# Patient Record
Sex: Female | Born: 1950 | Race: White | Hispanic: No | Marital: Single | Smoking: Former smoker
Health system: Southern US, Community
[De-identification: ages and names within clinical notes are randomized; demographics above are authoritative.]

## PROBLEM LIST (undated history)

## (undated) ENCOUNTER — Encounter

## (undated) ENCOUNTER — Telehealth

## (undated) DIAGNOSIS — G4733 Obstructive sleep apnea (adult) (pediatric): Secondary | ICD-10-CM

## (undated) DIAGNOSIS — Z794 Long term (current) use of insulin: Secondary | ICD-10-CM

## (undated) DIAGNOSIS — M549 Dorsalgia, unspecified: Secondary | ICD-10-CM

## (undated) DIAGNOSIS — E1143 Type 2 diabetes mellitus with diabetic autonomic (poly)neuropathy: Secondary | ICD-10-CM

## (undated) HISTORY — PX: BREAST SURGERY: SHX581

---

## 2015-10-21 DIAGNOSIS — G894 Chronic pain syndrome: Secondary | ICD-10-CM

## 2015-10-21 DIAGNOSIS — M1990 Unspecified osteoarthritis, unspecified site: Secondary | ICD-10-CM

## 2015-10-21 DIAGNOSIS — E1149 Type 2 diabetes mellitus with other diabetic neurological complication: Principal | ICD-10-CM

## 2015-10-21 MED ORDER — TRAMADOL HCL 50 MG PO TABS
50 mg | Freq: Four times a day (QID) | ORAL | 0 refills | PRN
Start: 2015-10-21 — End: ?

## 2015-10-21 NOTE — Telephone Encounter
Tramadol request denied-needs appt for 3 mo refill-cntrl med

## 2015-10-21 NOTE — Telephone Encounter
Noted.

## 2015-10-25 ENCOUNTER — Ambulatory Visit: Attending: Family Medicine | Primary: Family Medicine

## 2015-10-25 DIAGNOSIS — Z1331 Encounter for screening for depression: Secondary | ICD-10-CM

## 2015-10-25 DIAGNOSIS — F339 Major depressive disorder, recurrent, unspecified: Secondary | ICD-10-CM

## 2015-10-25 DIAGNOSIS — E1149 Type 2 diabetes mellitus with other diabetic neurological complication: Secondary | ICD-10-CM

## 2015-10-25 DIAGNOSIS — E1169 Type 2 diabetes mellitus with other specified complication: Secondary | ICD-10-CM

## 2015-10-25 DIAGNOSIS — I10 Essential (primary) hypertension: Secondary | ICD-10-CM

## 2015-10-25 DIAGNOSIS — H6690 Otitis media, unspecified, unspecified ear: Secondary | ICD-10-CM

## 2015-10-25 DIAGNOSIS — M1990 Unspecified osteoarthritis, unspecified site: Secondary | ICD-10-CM

## 2015-10-25 DIAGNOSIS — E1165 Type 2 diabetes mellitus with hyperglycemia: Secondary | ICD-10-CM

## 2015-10-25 DIAGNOSIS — G894 Chronic pain syndrome: Secondary | ICD-10-CM

## 2015-10-25 DIAGNOSIS — Z794 Long term (current) use of insulin: Secondary | ICD-10-CM

## 2015-10-25 DIAGNOSIS — F411 Generalized anxiety disorder: Secondary | ICD-10-CM

## 2015-10-25 DIAGNOSIS — Z6841 Body Mass Index (BMI) 40.0 and over, adult: Principal | ICD-10-CM

## 2015-10-25 DIAGNOSIS — M797 Fibromyalgia: Secondary | ICD-10-CM

## 2015-10-25 MED ORDER — LEVOFLOXACIN 250 MG PO TABS
250 mg | Freq: Every day | ORAL | 0 refills | Status: CP
Start: 2015-10-25 — End: ?

## 2015-10-25 MED ORDER — TRAMADOL HCL 50 MG PO TABS
50 mg | Freq: Four times a day (QID) | ORAL | 0 refills | Status: CP | PRN
Start: 2015-10-25 — End: 2015-12-05

## 2015-10-25 MED ORDER — HYDROCODONE-ACETAMINOPHEN 5-325 MG PO TABS
1 | ORAL_TABLET | Freq: Two times a day (BID) | ORAL | 0 refills | Status: CP | PRN
Start: 2015-10-25 — End: 2016-04-18

## 2015-10-25 MED ORDER — INSULIN ASPART 100 UNIT/ML SC SOPN
21 [IU] | Freq: Three times a day (TID) | SUBCUTANEOUS | 3 refills | Status: CP
Start: 2015-10-25 — End: 2016-06-02

## 2015-10-25 NOTE — Progress Notes
CHIEF COMPLAINT:       Vicki Cannon presents for Medications Refill (3 mo f/u)     SUBJECTIVE:       Pt is an Secondary school teacher in nursing.     Hypertension   The problem is unchanged. The problem is controlled. Associated symptoms include malaise/fatigue. Pertinent negatives include no chest pain or palpitations. There are no associated agents to hypertension. There are no known risk factors for coronary artery disease.  The current treatment provides moderate improvement. There are no compliance problems.  Hypertensive end-organ damage includes CAD/MI, heart failure, PVD and renovascular disease. She admits that   She just got out of class and is stressed and a/c in car not working.   Diabetes  This is a recurrent problem. The problem has been gradually improving.  Recent HbA1C was reviewed. Associated symptoms include fatigue and polyuria.  Pertinent negatives include no nausea, vomiting,syncope, or weakness.  There are no compliance problems. Diabetic  end-organ damage includes CAD/MI, heart failure, PVD and renovascular disease. The treatment provided moderate relief. BS have been elevated. She is taking novolog and levemir.  She cannot tolerate glucophage.     Hx of fibromyalgia and PVD.  Pt is on routine ultram and takes norco prn . She needs refills today. She is on plavix.She admits that her pain is chronic and severe in her hands and legs. She is able to perform her ADLs and IADLs independently as long as her pain is controlled.     The pt c/o right ear pain with dizziness. She was tx w z-pack with minimal relief.         Patient Active Problem List   Diagnosis   ? Controlled substance agreement signed   ? Chronic pain syndrome   ? Anxiety disorder   ? Essential hypertension   ? Diabetes mellitus type 2, uncontrolled   ? Insomnia, unspecified   ? RLS (restless legs syndrome)   ? Fibromyalgia   ? Diabetic neuropathy   ? HLD (hyperlipidemia)   ? CAD (coronary artery disease)   ? Insomnia

## 2015-10-25 NOTE — Progress Notes
?   Osteoarthritis, unspecified osteoarthritis type, unspecified site   ? Depression   ? Rotator cuff syndrome of left shoulder   ? PAD (peripheral artery disease)   ? BMI 40.0-44.9, adult        Past Medical History:   Diagnosis Date   ? Anxiety    ? AP (angina pectoris)    ? Arthritis    ? Asthma    ? Back injury    ? CHF (congestive heart failure)    ? Depression    ? Diabetes mellitus    ? Fibromyalgia    ? Gallbladder disease    ? GERD (gastroesophageal reflux disease)    ? Heart attack    ? Hyperlipidemia    ? Hypertension    ? Insomnia    ? Loss of vision    ? Neuropathy    ? Wears glasses       Past Surgical History:   Procedure Laterality Date   ? BREAST SURGERY     ? CHOLECYSTECTOMY     ? COSMETIC SURGERY        Family History   Problem Relation Age of Onset   ? Diabetes Mother    ? High Blood Pressure Mother    ? Heart Attack Mother    ? Diabetes Father    ? Heart Attack Father    ? Cancer Brother    ? Breast Cancer Brother    ? Cataract Paternal Grandfather       Social History     Social History   ? Marital status: Single     Spouse name: N/A   ? Number of children: N/A   ? Years of education: N/A     Occupational History   ? Not on file.     Social History Main Topics   ? Smoking status: Former Smoker     Quit date: 04/06/1978   ? Smokeless tobacco: Not on file   ? Alcohol use 15.0 oz/week     3 Glasses of wine per week   ? Drug use: No   ? Sexual activity: No     Other Topics Concern   ? Not on file     Social History Narrative      Allergies   Allergen Reactions   ? Cortizone-5 [Hydrocortisone] Palpitations   ? Latex Other (See Comments)     Asthma     ? Cymbalta [Duloxetine Hcl] Other (See Comments)     tremors      Outpatient Medications Prior to Visit   Medication Sig Dispense Refill   ? ALPRAZolam (XANAX) 1 MG Tablet Take 1 tablet by mouth daily as needed for sleep. 30 tablet 2   ? atorvastatin (LIPITOR) 10 MG Tablet Take 40 mg by mouth nightly at bedtime.

## 2015-10-25 NOTE — Progress Notes
GEN: No apparent distress.     HEENT: PERRL, EOMI, OP without exudates and no erythema. Right TM with retraction and erythema    NECK: Ant. Cervical  lymphadenopathy, Normal trachea. Thyroid normal size, no masses, nontender.    HEART: RRR , no murmurs.     LUNGS: CTA b/l. No Rhonchi, Rhales , or Wheezing      MSK: No C/C/E,  Motor +4/5 in b/l UEs and  LEs. DTR +2/4 in UEs and LEs,   Sensory: +4/5 in b/l UEs and LEs, DP and Posterior tibialis pulses are intact +2/4 b/l . 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 B/l knee crepitus. Glenohumeral joint ROM with mild restriction of Abduction to 90 degrees.     NEURO: CN II to XII: grossly intact, No focal deficits.    GAIT: antalgic gait.     PSYCH:   Alert and oriented X3. No signs of dementia     SKIN:  no ulcerations.         ASSESSMENT:        ICD-10-CM ICD-9-CM    1. BMI 40.0-44.9, adult Z68.41 V85.41    2. Fibromyalgia M79.7 729.1    3. Recurrent major depressive disorder, remission status unspecified F33.9 296.30    4. Essential hypertension I10 401.9    5. Osteoarthritis, unspecified osteoarthritis type, unspecified site M19.90 715.90 traMADol (ULTRAM) 50 MG Tablet      HYDROcodone-acetaminophen (NORCO) 5-325 MG Tablet   6. Otitis media, unspecified chronicity, unspecified laterality, unspecified otitis media type H66.90 382.9 levoFLOXacin (LEVAQUIN) 250 MG Tablet   7. Other diabetic neurological complication associated with type 2 diabetes mellitus E11.49 250.60 traMADol (ULTRAM) 50 MG Tablet      HYDROcodone-acetaminophen (NORCO) 5-325 MG Tablet   8. Chronic pain syndrome G89.4 338.4 traMADol (ULTRAM) 50 MG Tablet      HYDROcodone-acetaminophen (NORCO) 5-325 MG Tablet   9. Generalized anxiety disorder F41.1 300.02 HYDROcodone-acetaminophen (NORCO) 5-325 MG Tablet   10. Uncontrolled type 2 diabetes mellitus with other specified complication, with long-term current use of insulin E11.69 250.82 insulin aspart (NovoLOG) pen    E11.65 V58.67     Z79.4

## 2015-10-25 NOTE — Progress Notes
11. Screening for depression Z13.89 V79.0       PLAN:      Orders Placed This Encounter   Medications   ? traMADol (ULTRAM) 50 MG Tablet     Sig: Take 1 tablet by mouth every 6 hours as needed for pain.     Dispense:  120 tablet     Refill:  0   ? HYDROcodone-acetaminophen (NORCO) 5-325 MG Tablet     Sig: Take 1 tablet by mouth 2 times daily as needed for pain (severe pain only).     Dispense:  30 tablet     Refill:  0   ? levoFLOXacin (LEVAQUIN) 250 MG Tablet     Sig: Take 1 tablet by mouth daily for 10 days.     Dispense:  10 tablet     Refill:  0   ? insulin aspart (NovoLOG) pen     Sig: Inject 21 Units into the skin 3 times daily (before meals).     Dispense:  10 pen     Refill:  3      No orders of the following type(s) were placed in this encounter: Procedures       Return in about 3 months (around 01/25/2016).      Health Maintenance   Topic Date Due   ? Urine Protein  10/19/51   ? Diabetic Foot Exam  02/20/1952   ? Diabetic Eye Exam  07/18/1951   ? Preventive Wellness Visit  06/14/1969   ? Pap Smear  06/14/1972   ? Mammogram Discussion  06/15/1991   ? Colonoscopy  06/14/2001   ? Hemoglobin A1C  05/15/2015   ? Pneumovax / Prevnar (1 of 1 - PPSV23) 07/16/2016 (Originally 06/15/1970)   ? Zoster Vaccine (1) 07/16/2016 (Originally 06/15/2011)   ? Lipid Profile  11/12/2015   ? Creatinine  11/12/2015   ? Basic Metabolic Panel  11/12/2015   ? USPSTF HIV Risk Assessment  Excluded   ? USPSTF Hepatitis C Screening  Excluded   ? Influenza Vaccine  Excluded   ? DTaP,Tdap,and Td Vaccines  Excluded      Levaquin  Increase Novolog. Continue levemir  F/u with lab work ordered  RF on pain Rx given for chronic pain control: ultram and norco  Levaquin    Explained the need for patient to check their blood pressure outside of clinic to verify their numbers.    They were instructed to do this once a week for a month and bring for me to evaluate at their follow up visit with me.  Repeated bp in the office was stable after 10 min

## 2015-10-25 NOTE — Progress Notes
Formal conference with patient regarding physical findings, diagnostic conclusions, theraputic risks-advantages-alternatives and recommendations.  Verbal informed consent was given by the patient.  Patient with multiple number of diagnosis and treatment options.  I have reviewed the  medications with the patient and counseled the patient on the potential risks and benefits.  Moderate amount and complexity of data and risks of complications.

## 2015-10-25 NOTE — Progress Notes
?   clopidogrel (PLAVIX) 75 MG tablet Take 75 mg by mouth daily.     ? Esomeprazole Magnesium (NEXIUM PO) Take 40 mg by mouth 4 times daily.      ? furosemide (LASIX) 40 MG tablet Take 1 Tablet by mouth daily.     ? gabapentin (NEURONTIN) 300 MG Capsule Take 300mg , two tablets, three times a day for a total of 1800mg  540 capsule 0   ? HYDROcodone-acetaminophen (NORCO) 5-325 MG Tablet Take 1 tablet by mouth 2 times daily as needed for pain (severe pain only). 30 tablet 0   ? insulin aspart (NovoLOG) pen Inject 20 Units into the skin 3 times daily (before meals). 10 pen 3   ? insulin detemir (LEVEMIR) pen Inject 100 Units into the skin 2 times daily. 100 mL 11   ? lisinopril (PRINIVIL,ZESTRIL) 2.5 MG tablet Take 2.5 mg by mouth daily.     ? metoprolol tartrate (LOPRESSOR) 12.5 MG Tablet tablet Take by mouth daily.     ? Multiple Vitamin (MULTIVITAMIN) Tablet Take 1 Tablet by mouth daily.     ? potassium chloride 40 MEQ/30ML Solution Take 40 mEq by mouth.     ? sertraline (ZOLOFT) 25 MG tablet Take 1 Tablet by mouth daily. 30 Tablet 1   ? temazepam (RESTORIL) 30 MG Capsule Take 1 capsule by mouth nightly at bedtime as needed for sleep. 30 capsule 2   ? traMADol (ULTRAM) 50 MG Tablet Take 1 tablet by mouth every 6 hours as needed for pain. 120 tablet 0     No facility-administered medications prior to visit.          ROS:   Constitutional:  Fatigue +  Weakness negative.  Fever negative.  Chills negative.  Weight change negative. Insomnia negative  Eyes:  Vision changes negative,  Eye redness negative,   Eye pain negative.   Double vision negative.  Blurred vision negative    HENT:      Hearing change negative.  Tinnitus negative.  Vertigo +  Otalgia + Infection negative.  Nasal congestion negative  .  Rhinorrhea negative.  Sinus infection or pain negative.  Dental problems negative.   Sorethroat negative.    Respiratory: Cough negative.  SOB negative.  Sputum negative.  Hemoptysis negative. Wheezing negative.

## 2015-10-25 NOTE — Progress Notes
CV:   Chest pain negative.  Palpitations negative.  Orthopnea negative.  DOE negative.   Edema negative.  Intermittent claudication negative  .  Varicose veins negative.  Clots negative.   Endocrine:   Heat intolerance negative.  Cold intolerance negative.  Polyuria negative.  Polydipsia negative.    Hair or skin changes negative.     Neurologic:   Headaches negative,  Fainting negative.  LOC negative.  Seizures negative.  Weakness negative.  Paralysis negative.  Numbness negative.  Paresthesias negative.  Tremors negative.  Spasms negative.     Musculoskeletal:  Joint pains +  Joins swellings negative.  Joint stiffness negative.  Myalgias negative.    Psychiatric:    Mood disorder negative.  Memory change negative.  Suicidal thoughts or attempts negative  .  Memory loss negative.        OBJECTIVE:      No visits with results within 1 Month(s) from this visit.  Latest known visit with results is:    Office Visit on 08/02/2015   Component Date Value   ? CREATININE 08/02/2015 139.4    ? PH 08/02/2015 6.99    ? OXIDANT 08/02/2015 NEGATIVE    ? AMPHETAMINES 08/02/2015 NEGATIVE    ? BARBITURATES 08/02/2015 NEGATIVE    ? BENZODIAZEPINES 08/02/2015 POSITIVE*   ? ALPHAHYDROXYALPRAZOLAM 08/02/2015 272*   ? Alphahydroxymidazolam 08/02/2015 NEGATIVE    ? ALPHAHYDROXYTRIAZOLAM 08/02/2015 NEGATIVE    ? AMINOCLONAZEPAM 08/02/2015 NEGATIVE    ? Hydroxyethylflurazepam 08/02/2015 NEGATIVE    ? LORAZEPAM 08/02/2015 NEGATIVE    ? NORDIAZEPAM 08/02/2015 NEGATIVE    ? OXAZEPAM 08/02/2015 NEGATIVE    ? TEMAZEPAM 08/02/2015 NEGATIVE    ? MARIJUANA METABOLITE 08/02/2015 NEGATIVE    ? COCAINE METABOLITE 08/02/2015 NEGATIVE    ? METHADONE 08/02/2015 NEGATIVE    ? OPIATES 08/02/2015 NEGATIVE    ? OXYCODONE 08/02/2015 NEGATIVE    ? PHENCYCLIDINE 08/02/2015 NEGATIVE    ? COMMENT 08/02/2015           BP 136/81 - Pulse 83 - Temp 36.4 ?C (97.6 ?F) - Resp 16 - Ht 1.626 m (5\' 4" ) - Wt 115.4 kg (254 lb 6.4 oz) - BMI 43.67 kg/m2      Physical Exam

## 2015-12-04 DIAGNOSIS — G4701 Insomnia due to medical condition: Principal | ICD-10-CM

## 2015-12-04 DIAGNOSIS — M1990 Unspecified osteoarthritis, unspecified site: Secondary | ICD-10-CM

## 2015-12-04 DIAGNOSIS — G894 Chronic pain syndrome: Secondary | ICD-10-CM

## 2015-12-04 DIAGNOSIS — E1149 Type 2 diabetes mellitus with other diabetic neurological complication: Secondary | ICD-10-CM

## 2015-12-04 MED ORDER — TRAMADOL HCL 50 MG PO TABS
50 mg | Freq: Four times a day (QID) | ORAL | 0 refills | Status: CP | PRN
Start: 2015-12-04 — End: 2016-01-14

## 2015-12-04 MED ORDER — TEMAZEPAM 30 MG PO CAPS
30 mg | Freq: Every evening | ORAL | 2 refills | Status: CP | PRN
Start: 2015-12-04 — End: 2016-04-18

## 2015-12-04 NOTE — Telephone Encounter
Pt advised

## 2015-12-04 NOTE — Telephone Encounter
Scripts printed-call for pick up

## 2015-12-04 NOTE — Telephone Encounter
PCP Alanson AlySharma  [employer]    Pt would like a call when printed prescriptions can be picked up.  Thank you.

## 2016-01-14 DIAGNOSIS — G894 Chronic pain syndrome: Secondary | ICD-10-CM

## 2016-01-14 DIAGNOSIS — M1990 Unspecified osteoarthritis, unspecified site: Secondary | ICD-10-CM

## 2016-01-14 DIAGNOSIS — E1149 Type 2 diabetes mellitus with other diabetic neurological complication: Principal | ICD-10-CM

## 2016-01-14 MED ORDER — TRAMADOL HCL 50 MG PO TABS
50 mg | Freq: Four times a day (QID) | ORAL | 0 refills | Status: CP | PRN
Start: 2016-01-14 — End: 2016-02-27

## 2016-01-14 NOTE — Telephone Encounter
script printed-call for pick up

## 2016-01-14 NOTE — Telephone Encounter
Called and advised the patient the medication requested is ready for pick up and the patient verbally acknowledged. AH 01/14/2016

## 2016-01-14 NOTE — Telephone Encounter
Pcp sharma    Pt is requesting medication refill.    Thank you.    [id number] [address], [place] - [phone]?[Patient Preferred]??[phone]

## 2016-02-26 DIAGNOSIS — E1149 Type 2 diabetes mellitus with other diabetic neurological complication: Principal | ICD-10-CM

## 2016-02-26 DIAGNOSIS — G894 Chronic pain syndrome: Secondary | ICD-10-CM

## 2016-02-26 DIAGNOSIS — M1990 Unspecified osteoarthritis, unspecified site: Secondary | ICD-10-CM

## 2016-02-26 MED ORDER — TRAMADOL HCL 50 MG PO TABS
50 mg | Freq: Four times a day (QID) | ORAL | 0 refills | Status: CP | PRN
Start: 2016-02-26 — End: 2016-04-18

## 2016-03-02 NOTE — Telephone Encounter
Vm informed

## 2016-04-15 DIAGNOSIS — E1149 Type 2 diabetes mellitus with other diabetic neurological complication: Secondary | ICD-10-CM

## 2016-04-15 DIAGNOSIS — M1990 Unspecified osteoarthritis, unspecified site: Secondary | ICD-10-CM

## 2016-04-15 DIAGNOSIS — G894 Chronic pain syndrome: Secondary | ICD-10-CM

## 2016-04-15 DIAGNOSIS — G4701 Insomnia due to medical condition: Principal | ICD-10-CM

## 2016-04-15 MED ORDER — TRAMADOL HCL 50 MG PO TABS
50 mg | Freq: Four times a day (QID) | ORAL | 0 refills | PRN
Start: 2016-04-15 — End: ?

## 2016-04-15 MED ORDER — TEMAZEPAM 30 MG PO CAPS
30 mg | Freq: Every evening | ORAL | 2 refills | PRN
Start: 2016-04-15 — End: ?

## 2016-04-17 ENCOUNTER — Ambulatory Visit: Attending: Family Medicine | Primary: Family Medicine

## 2016-04-17 DIAGNOSIS — K219 Gastro-esophageal reflux disease without esophagitis: Secondary | ICD-10-CM

## 2016-04-17 DIAGNOSIS — M1990 Unspecified osteoarthritis, unspecified site: Secondary | ICD-10-CM

## 2016-04-17 DIAGNOSIS — E785 Hyperlipidemia, unspecified: Secondary | ICD-10-CM

## 2016-04-17 DIAGNOSIS — E1149 Type 2 diabetes mellitus with other diabetic neurological complication: Secondary | ICD-10-CM

## 2016-04-17 DIAGNOSIS — H547 Unspecified visual loss: Secondary | ICD-10-CM

## 2016-04-17 DIAGNOSIS — K829 Disease of gallbladder, unspecified: Secondary | ICD-10-CM

## 2016-04-17 DIAGNOSIS — I209 Angina pectoris, unspecified: Secondary | ICD-10-CM

## 2016-04-17 DIAGNOSIS — M797 Fibromyalgia: Secondary | ICD-10-CM

## 2016-04-17 DIAGNOSIS — E1165 Type 2 diabetes mellitus with hyperglycemia: Secondary | ICD-10-CM

## 2016-04-17 DIAGNOSIS — E1140 Type 2 diabetes mellitus with diabetic neuropathy, unspecified: Secondary | ICD-10-CM

## 2016-04-17 DIAGNOSIS — I509 Heart failure, unspecified: Secondary | ICD-10-CM

## 2016-04-17 DIAGNOSIS — E118 Type 2 diabetes mellitus with unspecified complications: Principal | ICD-10-CM

## 2016-04-17 DIAGNOSIS — Z794 Long term (current) use of insulin: Secondary | ICD-10-CM

## 2016-04-17 DIAGNOSIS — Z6841 Body Mass Index (BMI) 40.0 and over, adult: Secondary | ICD-10-CM

## 2016-04-17 DIAGNOSIS — G894 Chronic pain syndrome: Secondary | ICD-10-CM

## 2016-04-17 DIAGNOSIS — J45909 Unspecified asthma, uncomplicated: Secondary | ICD-10-CM

## 2016-04-17 DIAGNOSIS — G4700 Insomnia, unspecified: Secondary | ICD-10-CM

## 2016-04-17 DIAGNOSIS — F419 Anxiety disorder, unspecified: Secondary | ICD-10-CM

## 2016-04-17 DIAGNOSIS — Z973 Presence of spectacles and contact lenses: Secondary | ICD-10-CM

## 2016-04-17 DIAGNOSIS — S3992XA Unspecified injury of lower back, initial encounter: Secondary | ICD-10-CM

## 2016-04-17 DIAGNOSIS — E1169 Type 2 diabetes mellitus with other specified complication: Secondary | ICD-10-CM

## 2016-04-17 DIAGNOSIS — I10 Essential (primary) hypertension: Secondary | ICD-10-CM

## 2016-04-17 DIAGNOSIS — G4701 Insomnia due to medical condition: Secondary | ICD-10-CM

## 2016-04-17 DIAGNOSIS — G629 Polyneuropathy, unspecified: Secondary | ICD-10-CM

## 2016-04-17 DIAGNOSIS — F411 Generalized anxiety disorder: Secondary | ICD-10-CM

## 2016-04-17 DIAGNOSIS — F329 Major depressive disorder, single episode, unspecified: Secondary | ICD-10-CM

## 2016-04-17 DIAGNOSIS — I219 Acute myocardial infarction, unspecified: Principal | ICD-10-CM

## 2016-04-17 DIAGNOSIS — E119 Type 2 diabetes mellitus without complications: Secondary | ICD-10-CM

## 2016-04-17 MED ORDER — METOPROLOL TARTRATE 25 MG PO TABS
25 mg | Freq: Two times a day (BID) | ORAL | 3 refills
Start: 2016-04-17 — End: ?

## 2016-04-17 MED ORDER — INSULIN DETEMIR 100 UNIT/ML SC SOPN
11 refills
Start: 2016-04-17 — End: ?

## 2016-04-17 MED ORDER — HYDROCODONE-ACETAMINOPHEN 5-325 MG PO TABS
1 | ORAL_TABLET | Freq: Two times a day (BID) | ORAL | 0 refills | Status: CP | PRN
Start: 2016-04-17 — End: ?

## 2016-04-17 MED ORDER — TRAMADOL HCL 50 MG PO TABS
50 mg | Freq: Four times a day (QID) | ORAL | 0 refills | Status: CP | PRN
Start: 2016-04-17 — End: 2016-06-02

## 2016-04-17 MED ORDER — TEMAZEPAM 30 MG PO CAPS
30 mg | Freq: Every evening | ORAL | 2 refills | Status: CP | PRN
Start: 2016-04-17 — End: ?

## 2016-04-17 MED ORDER — GLUCOSE 4 G PO CHEW JX
16 g | Freq: Once | ORAL | Status: CP
Start: 2016-04-17 — End: ?

## 2016-04-17 NOTE — Progress Notes
?   Preventive Wellness Visit  06/14/1969   ? Pap Smear  06/14/1972   ? Breast Cancer Screening  06/14/2001   ? Colon Cancer Screening  06/14/2001   ? Hemoglobin A1C  05/15/2015   ? Lipid Profile  11/12/2015   ? Creatinine  11/12/2015   ? Basic Metabolic Panel  11/12/2015   ? Pneumovax / Prevnar (1 of 1 - PPSV23) 07/16/2016 (Originally 06/15/1970)   ? Zoster Vaccine (1) 07/16/2016 (Originally 06/15/2011)   ? USPSTF HIV Risk Assessment  Excluded   ? USPSTF Hepatitis C Screening  Excluded   ? Influenza Vaccine  Excluded   ? DTaP,Tdap,and Td Vaccines  Excluded      Labs done in office: BS 68 and hba1c is 13.2  Increase levemir to 105 in am and 102 in pm  Continue novolog  F/u w cardilogy  Explained the need for patient to check their blood pressure outside of clinic to verify their numbers.    They were instructed to do this three times per week for a month and bring the log  for me to evaluate at their follow up visit with me.  RF on pain Rx and restoril  UDS  Formal conference with patient regarding physical findings, diagnostic conclusions, theraputic risks-advantages-alternatives and recommendations.  Verbal informed consent was given by the patient.  Patient with multiple number of diagnosis and treatment options.  I have reviewed the  medications with the patient and counseled the patient on the potential risks and benefits.  Moderate amount and complexity of data and risks of complications.  Glucose tablet given today  Education given to the patient regarding the need to hold novolog when she misses a meal and reviewed signs of hypoglycemia

## 2016-04-17 NOTE — Progress Notes
5. Fibromyalgia M79.7 729.1    6. Uncontrolled type 2 diabetes mellitus with other specified complication, with long-term current use of insulin E11.69 250.82 insulin detemir (LEVEMIR) pen    E11.65 V58.67     Z79.4     7. Chronic pain syndrome G89.4 338.4 traMADol (ULTRAM) 50 MG PO Tablet      HYDROcodone-acetaminophen (NORCO) 5-325 MG PO Tablet   8. Osteoarthritis, unspecified osteoarthritis type, unspecified site M19.90 715.90 traMADol (ULTRAM) 50 MG PO Tablet      HYDROcodone-acetaminophen (NORCO) 5-325 MG PO Tablet   9. 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 Generalized anxiety disorder F41.1 300.02 HYDROcodone-acetaminophen (NORCO) 5-325 MG PO Tablet      PLAN:      Orders Placed This Encounter   Medications   ? glucose chewable tablet 16 g   ? metoprolol tartrate (LOPRESSOR) 25 MG PO Tablet     Sig: Take 1 tablet by mouth 2 times daily.     Dispense:  60 tablet     Refill:  3   ? insulin detemir (LEVEMIR) pen     Sig: Use 105 units in am and 102 u in pm     Dispense:  100 mL     Refill:  11     Dispense needles to administer levemir and novolog upto 5 times per day   ? traMADol (ULTRAM) 50 MG PO Tablet     Sig: Take 1 tablet by mouth every 6 hours as needed for pain.     Dispense:  120 tablet     Refill:  0   ? temazepam (RESTORIL) 30 MG PO Capsule     Sig: Take 1 capsule by mouth nightly at bedtime as needed for sleep.     Dispense:  30 capsule     Refill:  2   ? HYDROcodone-acetaminophen (NORCO) 5-325 MG PO Tablet     Sig: Take 1 tablet by mouth 2 times daily as needed for pain (severe pain only).     Dispense:  60 tablet     Refill:  0      Orders Placed This Encounter   Procedures   ? POCT glucose-Inhouse   ? POCT glycosylated hemoglobin (Hb A1C)        Return in about 3 months (around 07/16/2016).      Health Maintenance   Topic Date Due   ? Urine Microalbumin  06-14-51   ? Diabetic Foot Exam  06-14-51   ? Diabetic Eye Exam  06-14-51

## 2016-04-17 NOTE — Progress Notes
Change in appetite negative.  Nausea negative.  Vomiting negative.  Change in BMs negative.  Blood per rectum negative. Melena negative  Neurologic:   Headaches negative,  Fainting negative.  LOC negative.  Seizures negative.  Weakness negative.  Paralysis negative.  Numbness+.  Paresthesias negative.  Tremors negative.  Spasms negative.     Musculoskeletal:  Joint pains +.  Joins swellings negative.  Joint stiffness negative.  Myalgias negative.          OBJECTIVE:      Office Visit on 04/17/2016   Component Date Value   ? Glucose Random 04/17/2016 68*   ? Hemoglobin A1C, POC 04/17/2016 13.2          BP (!) 163/91 - Pulse 86 - Temp 36.9 ?C (98.4 ?F) - Resp 16 - Ht 1.626 m (5\' 4" ) - Wt 114.7 kg (252 lb 12.8 oz) - BMI 43.39 kg/m2      Physical Exam     GEN: No apparent distress.         NECK: No lymphadenopathy, Normal trachea. Thyroid normal size, no masses, nontender.    HEART: RRR , no murmurs.     LUNGS: CTA b/l. No Rhonchi, Rhales , or Wheezing        MSK: No C/C/E,  Motor +4/5 in b/l UEs and  LEs. DTR +2/4 in UEs and LEs,   Sensory: +3/5 in b/l UEs and LEs, DP and Posterior tibialis pulses are intact +2/4 b/l . B/l hands w swan neck deformities of the DIP joints.     NEURO: CN II to XII: grossly intact, No focal deficits.    PSYCH:   Alert and oriented X3. No signs of dementia           ASSESSMENT:        ICD-10-CM ICD-9-CM    1. Uncontrolled type 2 diabetes mellitus with complication, with long-term current use of insulin E11.8 250.82 POCT glucose-Inhouse    E11.65 V58.67 POCT glycosylated hemoglobin (Hb A1C)    Z79.4  glucose chewable tablet 16 g   2. BMI 40.0-44.9, adult Z68.41 V85.41    3. Essential hypertension with goal blood pressure less than 130/80 I10 401.9 metoprolol tartrate (LOPRESSOR) 25 MG PO Tablet   4. Other diabetic neurological complication associated with type 2 diabetes mellitus E11.49 250.60 traMADol (ULTRAM) 50 MG PO Tablet      HYDROcodone-acetaminophen (NORCO) 5-325 MG PO Tablet

## 2016-04-17 NOTE — Progress Notes
CHIEF COMPLAINT:       Vicki Cannon presents for Medications Refill     SUBJECTIVE:         Hypertension   The problem is unchanged. The problem is controlled. Associated symptoms include malaise/fatigue. Pertinent negatives include no chest pain or palpitations. There are no associated agents to hypertension. There are no known risk factors for coronary artery disease.  The current treatment provides moderate improvement. There are no compliance problems.  Hypertensive end-organ damage includes CAD/MI, heart failure, PVD and renovascular disease. Cardiology recently increased metoprolol to 25mg  bid      Diabetes  This is a recurrent problem. The problem has been gradually worsening.  Recent HbA1C was reviewed. Associated symptoms include fatigue and polyuria.  Pertinent negatives include no nausea, vomiting,syncope, or weakness.  There are compliance problems with lab testing. . Diabetic  end-organ damage includes CAD/MI, heart failure, PVD and renovascular disease. The treatment with levemir and novolog provides moderate relief. Pt admits needing a glucose tablet b/c she missed a meal and feels shaky        OA:  Requesting refills on pain medications. Not sleepy. Pain Rx allows her to do her ADL's and IADL's independently. Pt w diabetic neuropathy w severe pain in her feet and fibromyalgia    She has chronic insomnia and requesting restoril rf.        Patient Active Problem List   Diagnosis   ? Controlled substance agreement signed   ? Chronic pain syndrome   ? Anxiety disorder   ? Essential hypertension   ? Diabetes mellitus type 2, uncontrolled   ? Insomnia, unspecified   ? RLS (restless legs syndrome)   ? Fibromyalgia   ? Diabetic neuropathy   ? HLD (hyperlipidemia)   ? CAD (coronary artery disease)   ? Insomnia   ? Osteoarthritis, unspecified osteoarthritis type, unspecified site   ? Depression   ? Rotator cuff syndrome of left shoulder   ? PAD (peripheral artery disease)   ? BMI 40.0-44.9, adult

## 2016-04-17 NOTE — Progress Notes
Past Medical History:   Diagnosis Date   ? Anxiety    ? AP (angina pectoris)    ? Arthritis    ? Asthma    ? Back injury    ? CHF (congestive heart failure)    ? Depression    ? Diabetes mellitus    ? Diabetic neuropathy 06/02/2013   ? Fibromyalgia    ? Gallbladder disease    ? GERD (gastroesophageal reflux disease)    ? Heart attack    ? Hyperlipidemia    ? Hypertension    ? Insomnia    ? Insomnia, unspecified 06/02/2013   ? Loss of vision    ? Neuropathy    ? Osteoarthritis, unspecified osteoarthritis type, unspecified site 02/15/2014   ? Wears glasses       Past Surgical History:   Procedure Laterality Date   ? BREAST SURGERY     ? CHOLECYSTECTOMY     ? COSMETIC SURGERY        Family History   Problem Relation Age of Onset   ? Diabetes Mother    ? High Blood Pressure Mother    ? Heart Attack Mother    ? Diabetes Father    ? Heart Attack Father    ? Cancer Brother    ? Breast Cancer Brother    ? Cataract Paternal Grandfather       Social History     Social History   ? Marital status: Single     Spouse name: N/A   ? Number of children: N/A   ? Years of education: N/A     Occupational History   ? Not on file.     Social History Main Topics   ? Smoking status: Former Smoker     Quit date: 04/06/1978   ? Smokeless tobacco: Never Used   ? Alcohol use 15.0 oz/week     3 Glasses of wine per week   ? Drug use: No   ? Sexual activity: No     Other Topics Concern   ? Not on file     Social History Narrative      Allergies   Allergen Reactions   ? Cortizone-5 [Hydrocortisone] Palpitations   ? Latex Other (See Comments)     Asthma     ? Cymbalta [Duloxetine Hcl] Other (See Comments)     tremors      Outpatient Medications Prior to Visit   Medication Sig Dispense Refill   ? ALPRAZolam (XANAX) 1 MG Tablet Take 1 tablet by mouth daily as needed for sleep. 30 tablet 2   ? atorvastatin (LIPITOR) 10 MG Tablet Take 40 mg by mouth nightly at bedtime.     ? clopidogrel (PLAVIX) 75 MG tablet Take 75 mg by mouth daily.

## 2016-04-17 NOTE — Progress Notes
?   Esomeprazole Magnesium (NEXIUM PO) Take 40 mg by mouth 4 times daily.      ? furosemide (LASIX) 40 MG tablet Take 1 Tablet by mouth daily.     ? gabapentin (NEURONTIN) 300 MG Capsule Take 300mg , two tablets, three times a day for a total of 1800mg  540 capsule 0   ? HYDROcodone-acetaminophen (NORCO) 5-325 MG Tablet Take 1 tablet by mouth 2 times daily as needed for pain (severe pain only). 30 tablet 0   ? insulin aspart (NovoLOG) pen Inject 21 Units into the skin 3 times daily (before meals). 10 pen 3   ? insulin detemir (LEVEMIR) pen Inject 100 Units into the skin 2 times daily. 100 mL 11   ? lisinopril (PRINIVIL,ZESTRIL) 2.5 MG tablet Take 2.5 mg by mouth daily.     ? metoprolol tartrate (LOPRESSOR) 12.5 MG Tablet tablet Take by mouth daily.     ? Multiple Vitamin (MULTIVITAMIN) Tablet Take 1 Tablet by mouth daily.     ? potassium chloride 40 MEQ/30ML Solution Take 40 mEq by mouth.     ? sertraline (ZOLOFT) 25 MG tablet Take 1 Tablet by mouth daily. 30 Tablet 1   ? temazepam (RESTORIL) 30 MG Capsule Take 1 capsule by mouth nightly at bedtime as needed for sleep. 30 capsule 2   ? traMADol (ULTRAM) 50 MG PO Tablet Take 1 tablet by mouth every 6 hours as needed for pain. 120 tablet 0     No facility-administered medications prior to visit.          ROS:   Constitutional:  Fatigue + Weakness negative.  Fever negative.  Chills negative.  Weight change negative. Insomnia +      Respiratory: Cough negative.  SOB negative.  Sputum negative.  Hemoptysis negative. Wheezing negative.    CV:   Chest pain negative.  Palpitations negative.  Orthopnea negative.  DOE negative.   Edema negative.  Intermittent claudication negative  .  Varicose veins negative.  Clots negative.   Endocrine:   Heat intolerance negative.  Cold intolerance negative.  Polyuria +.  Polydipsia +   Hair or skin changes negative.   GI:   Abdominal pain negative.  Dysphagia negative.  Reflux negative.

## 2016-06-02 DIAGNOSIS — F411 Generalized anxiety disorder: Secondary | ICD-10-CM

## 2016-06-02 DIAGNOSIS — M1990 Unspecified osteoarthritis, unspecified site: Secondary | ICD-10-CM

## 2016-06-02 DIAGNOSIS — G894 Chronic pain syndrome: Secondary | ICD-10-CM

## 2016-06-02 DIAGNOSIS — E1149 Type 2 diabetes mellitus with other diabetic neurological complication: Principal | ICD-10-CM

## 2016-06-02 MED ORDER — INSULIN ASPART 100 UNIT/ML SC SOPN
21 [IU] | Freq: Three times a day (TID) | SUBCUTANEOUS | 3 refills | Status: CP
Start: 2016-06-02 — End: ?

## 2016-06-02 MED ORDER — ALPRAZOLAM 1 MG PO TABS
1 mg | Freq: Every day | ORAL | 2 refills | Status: CP | PRN
Start: 2016-06-02 — End: ?

## 2016-06-02 MED ORDER — TRAMADOL HCL 50 MG PO TABS
50 mg | Freq: Four times a day (QID) | ORAL | 0 refills | Status: CP | PRN
Start: 2016-06-02 — End: ?

## 2016-06-02 NOTE — Telephone Encounter
[id number] - [place], [place] - [address] [phone] (Phone)  [phone] (Fax)

## 2016-06-02 NOTE — Telephone Encounter
2 scripts printed-call

## 2016-06-02 NOTE — Telephone Encounter
Vm informed

## 2016-07-17 ENCOUNTER — Encounter: Attending: Family Medicine | Primary: Family Medicine

## 2020-06-04 HISTORY — PX: BACK SURGERY: SHX140

## 2020-09-29 ENCOUNTER — Emergency Department (HOSPITAL_COMMUNITY): Payer: Medicare Other

## 2020-09-29 ENCOUNTER — Emergency Department (HOSPITAL_COMMUNITY)
Admission: EM | Admit: 2020-09-29 | Discharge: 2020-09-29 | Disposition: A | Discharge status: Home / Self Care | Payer: Medicare Other | Attending: Emergency Medicine | Admitting: Emergency Medicine

## 2020-09-29 ENCOUNTER — Other Ambulatory Visit: Payer: Self-pay

## 2020-09-29 ENCOUNTER — Encounter (HOSPITAL_COMMUNITY): Payer: Self-pay | Admitting: Physician Assistant

## 2020-09-29 DIAGNOSIS — N3000 Acute cystitis without hematuria: Secondary | ICD-10-CM | POA: Insufficient documentation

## 2020-09-29 DIAGNOSIS — B9689 Other specified bacterial agents as the cause of diseases classified elsewhere: Secondary | ICD-10-CM | POA: Insufficient documentation

## 2020-09-29 DIAGNOSIS — R4182 Altered mental status, unspecified: Secondary | ICD-10-CM | POA: Diagnosis present

## 2020-09-29 DIAGNOSIS — F1190 Opioid use, unspecified, uncomplicated: Secondary | ICD-10-CM | POA: Diagnosis not present

## 2020-09-29 DIAGNOSIS — Z7982 Long term (current) use of aspirin: Secondary | ICD-10-CM | POA: Diagnosis not present

## 2020-09-29 DIAGNOSIS — R945 Abnormal results of liver function studies: Secondary | ICD-10-CM | POA: Insufficient documentation

## 2020-09-29 DIAGNOSIS — F1390 Sedative, hypnotic, or anxiolytic use, unspecified, uncomplicated: Secondary | ICD-10-CM | POA: Insufficient documentation

## 2020-09-29 DIAGNOSIS — Z20822 Contact with and (suspected) exposure to covid-19: Secondary | ICD-10-CM | POA: Insufficient documentation

## 2020-09-29 DIAGNOSIS — R748 Abnormal levels of other serum enzymes: Secondary | ICD-10-CM

## 2020-09-29 DIAGNOSIS — R410 Disorientation, unspecified: Secondary | ICD-10-CM | POA: Insufficient documentation

## 2020-09-29 DIAGNOSIS — R7989 Other specified abnormal findings of blood chemistry: Secondary | ICD-10-CM | POA: Diagnosis not present

## 2020-09-29 DIAGNOSIS — Z91040 Latex allergy status: Secondary | ICD-10-CM | POA: Insufficient documentation

## 2020-09-29 LAB — CBC WITH DIFFERENTIAL/PLATELET
Abs Immature Granulocytes: 0.03 10*3/uL (ref 0.00–0.07)
Basophils Absolute: 0.1 10*3/uL (ref 0.0–0.1)
Basophils Relative: 1 %
Eosinophils Absolute: 0.8 10*3/uL — ABNORMAL HIGH (ref 0.0–0.5)
Eosinophils Relative: 9 %
HCT: 46.9 % — ABNORMAL HIGH (ref 36.0–46.0)
Hemoglobin: 14.3 g/dL (ref 12.0–15.0)
Immature Granulocytes: 0 %
Lymphocytes Relative: 20 %
Lymphs Abs: 1.6 10*3/uL (ref 0.7–4.0)
MCH: 29.2 pg (ref 26.0–34.0)
MCHC: 30.5 g/dL (ref 30.0–36.0)
MCV: 95.9 fL (ref 80.0–100.0)
Monocytes Absolute: 0.5 10*3/uL (ref 0.1–1.0)
Monocytes Relative: 6 %
Neutro Abs: 5.3 10*3/uL (ref 1.7–7.7)
Neutrophils Relative %: 64 %
Platelets: 166 10*3/uL (ref 150–400)
RBC: 4.89 MIL/uL (ref 3.87–5.11)
RDW: 14.6 % (ref 11.5–15.5)
WBC: 8.3 10*3/uL (ref 4.0–10.5)
nRBC: 0 % (ref 0.0–0.2)

## 2020-09-29 LAB — COMPREHENSIVE METABOLIC PANEL
ALT: 156 U/L — ABNORMAL HIGH (ref 0–44)
AST: 124 U/L — ABNORMAL HIGH (ref 15–41)
Albumin: 3.1 g/dL — ABNORMAL LOW (ref 3.5–5.0)
Alkaline Phosphatase: 297 U/L — ABNORMAL HIGH (ref 38–126)
Anion gap: 13 (ref 5–15)
BUN: 17 mg/dL (ref 8–23)
CO2: 28 mmol/L (ref 22–32)
Calcium: 8.9 mg/dL (ref 8.9–10.3)
Chloride: 100 mmol/L (ref 98–111)
Creatinine, Ser: 1.37 mg/dL — ABNORMAL HIGH (ref 0.44–1.00)
GFR, Estimated: 42 mL/min — ABNORMAL LOW (ref >60)
Glucose, Bld: 311 mg/dL — ABNORMAL HIGH (ref 70–99)
Potassium: 3.9 mmol/L (ref 3.5–5.1)
Sodium: 141 mmol/L (ref 135–145)
Total Bilirubin: 1.4 mg/dL — ABNORMAL HIGH (ref 0.3–1.2)
Total Protein: 6.6 g/dL (ref 6.5–8.1)

## 2020-09-29 LAB — RAPID URINE DRUG SCREEN, HOSP PERFORMED
Amphetamines: NOT DETECTED
Barbiturates: NOT DETECTED
Benzodiazepines: POSITIVE — AB
Cocaine: NOT DETECTED
Opiates: POSITIVE — AB
Tetrahydrocannabinol: NOT DETECTED

## 2020-09-29 LAB — URINALYSIS, ROUTINE W REFLEX MICROSCOPIC
Bilirubin Urine: NEGATIVE
Glucose, UA: NEGATIVE mg/dL
Hgb urine dipstick: NEGATIVE
Ketones, ur: NEGATIVE mg/dL
Nitrite: NEGATIVE
Protein, ur: NEGATIVE mg/dL
Specific Gravity, Urine: 1.012 (ref 1.005–1.030)
pH: 5 (ref 5.0–8.0)

## 2020-09-29 LAB — AMMONIA: Ammonia: 13 umol/L (ref 9–35)

## 2020-09-29 LAB — BLOOD GAS, VENOUS
Acid-Base Excess: 2.4 mmol/L — ABNORMAL HIGH (ref 0.0–2.0)
Bicarbonate: 29.4 mmol/L — ABNORMAL HIGH (ref 20.0–28.0)
O2 Saturation: 53.1 %
Patient temperature: 98.6
pCO2, Ven: 57.4 mmHg (ref 44.0–60.0)
pH, Ven: 7.33 (ref 7.250–7.430)
pO2, Ven: 32.9 mmHg (ref 32.0–45.0)

## 2020-09-29 LAB — CBG MONITORING, ED: Glucose-Capillary: 289 mg/dL — ABNORMAL HIGH (ref 70–99)

## 2020-09-29 LAB — RESP PANEL BY RT-PCR (FLU A&B, COVID) ARPGX2
Influenza A by PCR: NEGATIVE
Influenza B by PCR: NEGATIVE
SARS Coronavirus 2 by RT PCR: NEGATIVE

## 2020-09-29 LAB — ETHANOL: Alcohol, Ethyl (B): <10 mg/dL (ref <10)

## 2020-09-29 MED ORDER — LORAZEPAM 2 MG/ML IJ SOLN: 0.5000 mg | Freq: Once | INTRAMUSCULAR | Status: DC

## 2020-09-29 MED ORDER — CEPHALEXIN 500 MG PO CAPS: 500.0000 mg | ORAL_CAPSULE | Freq: Four times a day (QID) | ORAL | 0 refills | Status: AC

## 2020-09-29 NOTE — ED Notes (Signed)
Pt pulled iv out. Niece at bedside

## 2020-09-29 NOTE — ED Notes (Signed)
Pure Wick placed on patient to obtain urine sample. 

## 2020-09-29 NOTE — ED Triage Notes (Signed)
Per EMS, [facility] states she has altered mental status since this am. Normally able to state meds and answer questions. Was a nurse prior . Was incontinence this am which is unusual for this pt. Last few days facility has had high CBGs. Recently she had a sister pass away and facility thinks family is sneaking in drugs or alcohol to cope with sadness. Pt did not know where she was or the year, but knew the year. Pt states she is tired CBG 345 90/60 sitting , laying 120/70 Hr 100

## 2020-09-29 NOTE — ED Provider Notes (Signed)
[facility]-EMERGENCY DEPT Provider Note   CSN: [id number] Arrival date & time: 09/29/20  1603     History No chief complaint on file.   Tonya Baker is a 70 y.o. female.  HPI   70 y/o female who presents to the ED today for eval of AMS.  Level 5 caveat as patient is altered and cannot provide history.  4:59 PM Discussed case with Herbert Seta, staff at [facility] house.  She states that at baseline the patient is alert and oriented x4.  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  She is currently at [facility] for rehab and over the last few days has had a decline in her mental status.  Staff notes that patient recently lost a close family member and has been exhibiting some depressive behaviors.  There was some concern that she may have had access to drugs or alcohol due to the quick decline in mental status.  She is normally able to complete her own ADLs but for the last few days she has been too weak. They further note that she has had some incontinence at the facility and has also had some elevated blood sugars.  Labs were completed 2 days ago and at that time she had some elevated LFTs with an AST of 154, ALT of 85.  Bilirubin was normal at 1.  Her creatinine was marginally elevated just above 1.  She has had no recent fevers or abnormal vital signs.  The rash on her body has been chronic for the last year.  History reviewed. No pertinent past medical history.  There are no problems to display for this patient.   History reviewed. No pertinent surgical history.   OB History   No obstetric history on file.     History reviewed. No pertinent family history.     Home Medications Prior to Admission medications   Medication Sig Start Date End Date Taking? Authorizing Provider  albuterol (PROVENTIL) (2.5 MG/3ML) 0.083% nebulizer solution Take 2.5 mg by nebulization every 8 (eight) hours as needed (congestion).   Yes [provider]  ascorbic acid (VITAMIN C) 500 MG tablet Take 500 mg by mouth every morning.   Yes [provider]  aspirin 81 MG chewable tablet Chew 81 mg by mouth every morning.   Yes [provider]  atorvastatin (LIPITOR) 40 MG tablet Take 40 mg by mouth at bedtime.   Yes [provider]  bisacodyl (DULCOLAX) 10 MG suppository Place 10 mg rectally daily as needed (constipation).   Yes [provider]  cephALEXin (KEFLEX) 500 MG capsule Take 1 capsule (500 mg total) by mouth 4 (four) times daily for 7 days. 09/29/20 10/06/20 Yes Karey Stucki S, PA-C  cholecalciferol (VITAMIN D3) 25 MCG (1000 UNIT) tablet Take 1,000 Units by mouth every morning.   Yes [provider]  cyclobenzaprine (FLEXERIL) 10 MG tablet Take 10 mg by mouth 3 (three) times daily.   Yes [provider]  docusate sodium (COLACE) 100 MG capsule Take 100 mg by mouth every morning.   Yes [provider]  famotidine (PEPCID) 20 MG tablet Take 20 mg by mouth every 12 (twelve) hours. For rash/itching   Yes [provider]  ferrous sulfate 325 (65 FE) MG tablet Take 325 mg by mouth every morning.   Yes [provider]  mineral oil (FLEET OIL) enema Place 1 enema rectally once.   Yes [provider]  Skin Protectants,  Misc. (EUCERIN) cream Apply 1 application topically daily.   Yes [provider]    Allergies    Cortisone, Hydrocortisone, and Latex  Review of Systems   Review of Systems  Constitutional:  Negative for chills and fever.       Dry mouth  HENT:  Negative for ear pain and sore throat.   Eyes:  Negative for pain and visual disturbance.  Respiratory:  Negative for cough and shortness of breath.   Cardiovascular:  Negative for chest pain.  Gastrointestinal:  Negative for abdominal pain, constipation, diarrhea, nausea and vomiting.  Genitourinary:  Negative for dysuria and hematuria.  Musculoskeletal:  Negative for back pain.   Skin:  Negative for color change and rash.  Neurological:  Negative for seizures and syncope.  All other systems reviewed and are negative.  Physical Exam Updated Vital Signs BP (!) 137/93   Pulse 87   Temp 97.9 F (36.6 C) (Oral)   Resp 18   SpO2 100%   Physical Exam Vitals and nursing note reviewed.  Constitutional:      General: She is not in acute distress.    Appearance: She is well-developed.  HENT:     Head: Normocephalic and atraumatic.  Eyes:     Conjunctiva/sclera: Conjunctivae normal.  Cardiovascular:     Rate and Rhythm: Normal rate and regular rhythm.     Heart sounds: Normal heart sounds. No murmur heard. Pulmonary:     Effort: Pulmonary effort is normal. No respiratory distress.     Breath sounds: Normal breath sounds. No wheezing, rhonchi or rales.  Abdominal:     General: Bowel sounds are normal.     Palpations: Abdomen is soft.     Tenderness: There is no abdominal tenderness. There is no guarding or rebound.  Musculoskeletal:     Cervical back: Neck supple.  Skin:    General: Skin is warm and dry.  Neurological:     Mental Status: She is alert.     Comments: Mental Status:  Alert, confused and somewhat dysarthric with intermittent aphasia. Able to follow simple commands. Able to state name and city. Unable to state year or situation. Able to state the president.  Cranial Nerves:  II:  pupils equal, round, reactive to light III,IV, VI: ptosis not present, extra-ocular motions intact bilaterally  V,VII: smile symmetric, facial light touch sensation equal VIII: hearing grossly normal to voice  X: uvula elevates symmetrically  XI: bilateral shoulder shrug symmetric and strong XII: midline tongue extension without fassiculations Motor:  Normal tone. 5/5 strength of BUE and BLE major muscle groups including strong and equal grip strength and dorsiflexion/plantar flexion Sensory: light touch normal in all extremities.     ED Results / Procedures /  Treatments   Labs (all labs ordered are listed, but only abnormal results are displayed) Labs Reviewed  CBC WITH DIFFERENTIAL/PLATELET - Abnormal; Notable for the following components:      Result Value   HCT 46.9 (*)    Eosinophils Absolute 0.8 (*)    All other components within normal limits  COMPREHENSIVE METABOLIC PANEL - Abnormal; Notable for the following components:   Glucose, Bld 311 (*)    Creatinine, Ser 1.37 (*)    Albumin 3.1 (*)    AST 124 (*)    ALT 156 (*)    Alkaline Phosphatase 297 (*)    Total Bilirubin 1.4 (*)    GFR, Estimated 42 (*)    All other components within normal limits  RAPID URINE DRUG SCREEN, HOSP PERFORMED - Abnormal; Notable for the following components:   Opiates POSITIVE (*)    Benzodiazepines POSITIVE (*)    All other components within normal limits  BLOOD GAS, VENOUS - Abnormal; Notable for the following components:   Bicarbonate 29.4 (*)    Acid-Base Excess 2.4 (*)    All other components within normal limits  URINALYSIS, ROUTINE W REFLEX MICROSCOPIC - Abnormal; Notable for the following components:   APPearance HAZY (*)    Leukocytes,Ua MODERATE (*)    Bacteria, UA MANY (*)    All other components within normal limits  CBG MONITORING, ED - Abnormal; Notable for the following components:   Glucose-Capillary 289 (*)    All other components within normal limits  RESP PANEL BY RT-PCR (FLU A&B, COVID) [id number]  URINE CULTURE  ETHANOL  AMMONIA    EKG None  Radiology DG Chest 2 View  Result Date: 09/29/2020 CLINICAL DATA:  Altered mental status. EXAM: CHEST - 2 VIEW COMPARISON:  None. FINDINGS: Low lung volumes are seen. This is likely secondary to the degree of patient inspiration. There is no evidence of acute infiltrate, pleural effusion or pneumothorax. The heart size and mediastinal contours are within normal limits. Radiopaque surgical clips are seen within the right upper quadrant. The visualized skeletal structures are unremarkable.  IMPRESSION: Low lung volumes without evidence of active cardiopulmonary disease. Electronically Signed   By: Aram Candela M.D.   On: 09/29/2020 18:08   CT Head Wo Contrast  Result Date: 09/29/2020 CLINICAL DATA:  Delirium. EXAM: CT HEAD WITHOUT CONTRAST TECHNIQUE: Contiguous axial images were obtained from the base of the skull through the vertex without intravenous contrast. COMPARISON:  None. FINDINGS: Brain: There is no evidence of an acute infarct, intracranial hemorrhage, mass, midline shift, or extra-axial fluid collection. There is mild cerebral atrophy. Vascular: Calcified atherosclerosis at the skull base. No hyperdense vessel. Skull: No fracture or suspicious osseous lesion. Sinuses/Orbits: Visualized paranasal sinuses and mastoid air cells are clear. Right cataract extraction. Other: None. IMPRESSION: No evidence of acute intracranial abnormality. Electronically Signed   By: Sebastian Ache M.D.   On: 09/29/2020 17:59    Procedures Procedures   Medications Ordered in ED Medications - No data to display  ED Course  I have reviewed the triage vital signs and the nursing notes.  Pertinent labs & imaging results that were available during my care of the patient were reviewed by me and considered in my medical decision making (see chart for details).    MDM Rules/Calculators/A&P                          70 y/o F presenting for eval of AMS  Reviewed/interpreted labs CBC wnl CMP with mildly elevated cr, mildly elevated lfts.  - hx cholecystitis, no abd ttp or nv    COVID neg UA concerning for uti, will tx with keflex and culture UDS + for opiates and benzos  - pt not prescribed benzos ETOH neg Ammonia neg  EKG with nse, lafb, low voltage, anteroseptal infarct  Reviewed/interpreted CXR - neg CT head - unremarkable  Pt mental status returned to baseline while she was observed in the emergency department.  Her symptoms suggest that maybe she is suffering from polypharmacy.   She has Rx for a benzo and multiple opiate medications, 1 of which was just recently transition from as needed to scheduled.  I discussed that this may be causing her  symptoms with the patient and her family member who works at the facility at bedside.  I discussed that her medications need to be adjusted.  Lower suspicion for infectious process causing her transient altered mental status though she does look like she has a mild UTI which we will treat with antibiotics.  Lower suspicion for TIA, CVA or other emergent neurologic process.  She will need to follow-up with PCP and return to the ED for [place] or worsening symptoms.  All questions were answered and patient stable for discharge.  Pt seen in conjunction with Dr. Estell Harpin who personally evaluated the patient and is in agreement with the plan.   Final Clinical Impression(s) / ED Diagnoses Final diagnoses:  Elevated serum creatinine  Elevated liver enzymes  Transient confusion  Acute cystitis without hematuria    Rx / DC Orders ED Discharge Orders          Ordered    cephALEXin (KEFLEX) 500 MG capsule  4 times daily        09/29/20 2034             Rayne Du 09/29/20 2035    Bethann Berkshire, MD 09/30/20 1029

## 2020-09-29 NOTE — Discharge Instructions (Addendum)
The patient's laboratory work showed a mild elevation in the creatinine and also showed some elevation in her liver enzymes.  She will need to have repeat labs obtained to follow-up on this and may need  Additionally there is concern for polypharmacy as patients mental status rapidly improved during her ED stay. She may need to have her medications regimen re-evaluated as she is on multiple sedating medications.   Please follow up with your primary care provider within 5-7 days for re-evaluation of your symptoms. If you do not have a primary care provider, information for a healthcare clinic has been provided for you to make arrangements for follow up care. Please return to the emergency department for any [place] or worsening symptoms.

## 2020-10-02 LAB — URINE CULTURE: Culture: >=100000 — AB

## 2020-10-03 ENCOUNTER — Telehealth: Payer: Self-pay | Admitting: Emergency Medicine

## 2020-10-03 NOTE — Progress Notes (Signed)
ED Antimicrobial Stewardship Positive Culture Follow Up   Tonya Baker is an 70 y.o. female who presented to [facility] on 09/29/2020 with a chief complaint of altered mental status.   No chief complaint on file.   Recent Results (from the past 720 hour(s))  Resp Panel by RT-PCR (Flu A&B, Covid) Nasopharyngeal Swab     Status: None   Collection Time: 09/29/20  5:06 PM   Specimen: Nasopharyngeal Swab; Nasopharyngeal(NP) swabs in vial transport medium  Result Value Ref Range Status   SARS Coronavirus 2 by RT PCR NEGATIVE NEGATIVE Final    Comment: (NOTE) SARS-CoV-2 target nucleic acids are NOT DETECTED.  The SARS-CoV-2 RNA is generally detectable in upper respiratory specimens during the acute phase of infection. The lowest concentration of SARS-CoV-2 viral copies this assay can detect is 138 copies/mL. A negative result does not preclude SARS-Cov-2 infection and should not be used as the sole basis for treatment or other patient management decisions. A negative result may occur with  improper specimen collection/handling, submission of specimen other than nasopharyngeal swab, presence of viral mutation(s) within the areas targeted by this assay, and inadequate number of viral copies(<138 copies/mL). A negative result must be combined with clinical observations, patient history, and epidemiological information. The expected result is Negative.  Fact Sheet for Patients:  BloggerCourse.com  Fact Sheet for Healthcare Providers:  SeriousBroker.it  This test is no t yet approved or cleared by the [place] FDA and  has been authorized for detection and/or diagnosis of SARS-CoV-2 by FDA under an Emergency Use Authorization (EUA). This EUA will remain  in effect (meaning this test can be used) for the duration of the COVID-19 declaration under Section 564(b)(1) of the Act, 21 U.S.C.section 360bbb-3(b)(1), unless the authorization is  terminated  or revoked sooner.       Influenza A by PCR NEGATIVE NEGATIVE Final   Influenza B by PCR NEGATIVE NEGATIVE Final    Comment: (NOTE) The Xpert Xpress SARS-CoV-2/FLU/RSV plus assay is intended as an aid in the diagnosis of influenza from Nasopharyngeal swab specimens and should not be used as a sole basis for treatment. Nasal washings and aspirates are unacceptable for Xpert Xpress SARS-CoV-2/FLU/RSV testing.  Fact Sheet for Patients: BloggerCourse.com  Fact Sheet for Healthcare Providers: SeriousBroker.it  This test is not yet approved or cleared by the [place] FDA and has been authorized for detection and/or diagnosis of SARS-CoV-2 by FDA under an Emergency Use Authorization (EUA). This EUA will remain in effect (meaning this test can be used) for the duration of the COVID-19 declaration under Section 564(b)(1) of the Act, 21 U.S.C. section 360bbb-3(b)(1), unless the authorization is terminated or revoked.  Performed at [facility], [address], [place], [place] [address]   Urine culture     Status: Abnormal   Collection Time: 09/29/20  8:33 PM   Specimen: Urine, Random  Result Value Ref Range Status   Specimen Description   Final    URINE, RANDOM Performed at [facility], [address], [place], [place] [address]    Special Requests   Final    NONE Performed at [facility], [address], [place], [place] [address]    Culture (A)  Final    >=100,000 COLONIES/mL KLEBSIELLA PNEUMONIAE Confirmed Extended Spectrum Beta-Lactamase Producer (ESBL).  In bloodstream infections from ESBL organisms, carbapenems are preferred over piperacillin/tazobactam. They are shown to have a lower risk of mortality.    Report Status 10/02/2020 FINAL  Final   Organism ID, Bacteria KLEBSIELLA PNEUMONIAE (A)  Final      Susceptibility   Klebsiella pneumoniae -  MIC*    AMPICILLIN >=32 RESISTANT Resistant     CEFAZOLIN >=64 RESISTANT Resistant     CEFEPIME >=32 RESISTANT Resistant     CEFTRIAXONE >=64 RESISTANT Resistant     CIPROFLOXACIN 1 SENSITIVE Sensitive     GENTAMICIN >=16 RESISTANT Resistant     IMIPENEM <=0.25 SENSITIVE Sensitive     NITROFURANTOIN 64 INTERMEDIATE Intermediate     TRIMETH/SULFA <=20 SENSITIVE Sensitive     AMPICILLIN/SULBACTAM >=32 RESISTANT Resistant     PIP/TAZO 32 INTERMEDIATE Intermediate     * >=100,000 COLONIES/mL KLEBSIELLA PNEUMONIAE   70 YO F reported to the ED from [facility] with altered mental status. Per staff at [facility] she has had elevated CBG's lately and had urinary incontinence the morning of ED visit (06/26) which is unusual for patient. She recently lost a sister and the facility has concerns that the patient may have had access to drugs or alcohol to cope with her sadness. Her urinalysis showed many bacteria, moderate leukocytes, 21-50 WBC's, and her urine culture grew > 100,000 colonies of  ESBL + Klebsiella Pneumoniae. The patient was discharged on Keflex 500 mg QID x 7 days but this is resistant based on the urine culture.   Plan: Call and perform a symptom check with caregiver or patient (if no longer has AMS).  - If no urinary symptoms and AMS has improved: Stop Keflex, no treatment necessary (asymptomatic bacteruria) - If urinary symptoms and AMS not improved: Stop Keflex, Start Bactrim 1 DS tablet BID x 3 days.   ED Provider: Jacalyn Lefevre, MD   Johnston Ebbs, Student Pharmacist 10/03/2020, 8:57 AM

## 2020-10-03 NOTE — Telephone Encounter (Signed)
Post ED Visit - Positive Culture Follow-up: Successful Patient Follow-Up  Culture assessed and recommendations reviewed by:  []  , Pharm.D. []  Enzo Bi, Pharm.D., BCPS AQ-ID []  , Pharm.D., BCPS []  Celedonio Miyamoto, Pharm.D., BCPS []  [place], Garvin Fila.D., BCPS, AAHIVP []  , Pharm.D., BCPS, AAHIVP []  Georgina Pillion, PharmD, BCPS []  , PharmD, BCPS []  [place], PharmD, BCPS []  [place], PharmD  Positive urine culture  []  Patient discharged without antimicrobial prescription and treatment is now indicated []  Organism is resistant to prescribed ED discharge antimicrobial []  Patient with positive blood cultures  Changes discussed with ED provider: MD Lyall antibiotic prescription symptom check, if no urinary symptoms and AMS has improved , stop Cephalexin, no treatment necessary, if urinary symptoms or AMS not improved, start keflex, start bactrim DS 1 tablet bid x 3 days, Call placed to [facility] , caregiver state improvement in AMS and no urinary symptoms, faxed result with recommendation to stop cephalexin  Faxed to [phone]   Lysle Pearl 10/03/2020, 11:21 AM

## 2020-10-19 ENCOUNTER — Encounter (HOSPITAL_COMMUNITY): Payer: Self-pay | Admitting: Emergency Medicine

## 2020-10-19 ENCOUNTER — Emergency Department (HOSPITAL_COMMUNITY)
Admission: EM | Admit: 2020-10-19 | Discharge: 2020-10-19 | Disposition: A | Discharge status: Home / Self Care | Payer: Medicare Other | Attending: Emergency Medicine | Admitting: Emergency Medicine

## 2020-10-19 ENCOUNTER — Other Ambulatory Visit: Payer: Self-pay

## 2020-10-19 ENCOUNTER — Emergency Department (HOSPITAL_COMMUNITY): Payer: Medicare Other

## 2020-10-19 DIAGNOSIS — Z79899 Other long term (current) drug therapy: Secondary | ICD-10-CM | POA: Insufficient documentation

## 2020-10-19 DIAGNOSIS — R410 Disorientation, unspecified: Secondary | ICD-10-CM | POA: Diagnosis present

## 2020-10-19 DIAGNOSIS — N390 Urinary tract infection, site not specified: Secondary | ICD-10-CM | POA: Diagnosis not present

## 2020-10-19 DIAGNOSIS — Z20822 Contact with and (suspected) exposure to covid-19: Secondary | ICD-10-CM | POA: Insufficient documentation

## 2020-10-19 DIAGNOSIS — Z91040 Latex allergy status: Secondary | ICD-10-CM | POA: Diagnosis not present

## 2020-10-19 DIAGNOSIS — Y900 Blood alcohol level of less than 20 mg/100 ml: Secondary | ICD-10-CM | POA: Diagnosis not present

## 2020-10-19 DIAGNOSIS — R404 Transient alteration of awareness: Secondary | ICD-10-CM

## 2020-10-19 DIAGNOSIS — R21 Rash and other nonspecific skin eruption: Secondary | ICD-10-CM | POA: Diagnosis not present

## 2020-10-19 DIAGNOSIS — B9689 Other specified bacterial agents as the cause of diseases classified elsewhere: Secondary | ICD-10-CM | POA: Diagnosis not present

## 2020-10-19 DIAGNOSIS — F1190 Opioid use, unspecified, uncomplicated: Secondary | ICD-10-CM | POA: Insufficient documentation

## 2020-10-19 DIAGNOSIS — F1390 Sedative, hypnotic, or anxiolytic use, unspecified, uncomplicated: Secondary | ICD-10-CM | POA: Insufficient documentation

## 2020-10-19 DIAGNOSIS — Z7982 Long term (current) use of aspirin: Secondary | ICD-10-CM | POA: Insufficient documentation

## 2020-10-19 LAB — CBC
HCT: 44.3 % (ref 36.0–46.0)
Hemoglobin: 13.7 g/dL (ref 12.0–15.0)
MCH: 30 pg (ref 26.0–34.0)
MCHC: 30.9 g/dL (ref 30.0–36.0)
MCV: 97.1 fL (ref 80.0–100.0)
Platelets: 219 10*3/uL (ref 150–400)
RBC: 4.56 MIL/uL (ref 3.87–5.11)
RDW: 15.4 % (ref 11.5–15.5)
WBC: 8.7 10*3/uL (ref 4.0–10.5)
nRBC: 0 % (ref 0.0–0.2)

## 2020-10-19 LAB — URINALYSIS, ROUTINE W REFLEX MICROSCOPIC
Bilirubin Urine: NEGATIVE
Glucose, UA: NEGATIVE mg/dL
Hgb urine dipstick: NEGATIVE
Ketones, ur: NEGATIVE mg/dL
Nitrite: POSITIVE — AB
Protein, ur: NEGATIVE mg/dL
Specific Gravity, Urine: 1.01 (ref 1.005–1.030)
WBC, UA: >50 WBC/hpf — ABNORMAL HIGH (ref 0–5)
pH: 7 (ref 5.0–8.0)

## 2020-10-19 LAB — COMPREHENSIVE METABOLIC PANEL
ALT: 15 U/L (ref 0–44)
AST: 25 U/L (ref 15–41)
Albumin: 3 g/dL — ABNORMAL LOW (ref 3.5–5.0)
Alkaline Phosphatase: 156 U/L — ABNORMAL HIGH (ref 38–126)
Anion gap: 7 (ref 5–15)
BUN: 10 mg/dL (ref 8–23)
CO2: 30 mmol/L (ref 22–32)
Calcium: 8.6 mg/dL — ABNORMAL LOW (ref 8.9–10.3)
Chloride: 110 mmol/L (ref 98–111)
Creatinine, Ser: 1.13 mg/dL — ABNORMAL HIGH (ref 0.44–1.00)
GFR, Estimated: 53 mL/min — ABNORMAL LOW (ref >60)
Glucose, Bld: 81 mg/dL (ref 70–99)
Potassium: 4.1 mmol/L (ref 3.5–5.1)
Sodium: 147 mmol/L — ABNORMAL HIGH (ref 135–145)
Total Bilirubin: 1 mg/dL (ref 0.3–1.2)
Total Protein: 6.8 g/dL (ref 6.5–8.1)

## 2020-10-19 LAB — RESP PANEL BY RT-PCR (FLU A&B, COVID) ARPGX2
Influenza A by PCR: NEGATIVE
Influenza B by PCR: NEGATIVE
SARS Coronavirus 2 by RT PCR: NEGATIVE

## 2020-10-19 LAB — RAPID URINE DRUG SCREEN, HOSP PERFORMED
Amphetamines: NOT DETECTED
Barbiturates: NOT DETECTED
Benzodiazepines: POSITIVE — AB
Cocaine: NOT DETECTED
Opiates: POSITIVE — AB
Tetrahydrocannabinol: NOT DETECTED

## 2020-10-19 LAB — ETHANOL: Alcohol, Ethyl (B): <10 mg/dL (ref <10)

## 2020-10-19 LAB — AMMONIA: Ammonia: 20 umol/L (ref 9–35)

## 2020-10-19 LAB — LACTIC ACID, PLASMA: Lactic Acid, Venous: 1.1 mmol/L (ref 0.5–1.9)

## 2020-10-19 MED ORDER — CIPROFLOXACIN IN D5W 400 MG/200ML IV SOLN
400.0000 mg | Freq: Once | INTRAVENOUS | Status: AC
  Administered 2020-10-19: 400 mg via INTRAVENOUS
  Filled 2020-10-19: qty 200

## 2020-10-19 MED ORDER — SODIUM CHLORIDE 0.9 % IV BOLUS
500.0000 mL | Freq: Once | INTRAVENOUS | Status: AC
  Administered 2020-10-19: 500 mL via INTRAVENOUS

## 2020-10-19 MED ORDER — CIPROFLOXACIN HCL 500 MG PO TABS: 500.0000 mg | ORAL_TABLET | Freq: Two times a day (BID) | ORAL | 0 refills | Status: DC

## 2020-10-19 MED ORDER — SODIUM CHLORIDE 0.9 % IV SOLN: 2.0000 g | Freq: Once | INTRAVENOUS | Status: DC

## 2020-10-19 NOTE — ED Triage Notes (Addendum)
Pt BIB EMS from [facility]. Staff reports increased confusion x 2 weeks, nothing acute. Pt reports left leg pain x 1-2 days. No recent traumas or falls. Pt requested staff call EMS. VSS.

## 2020-10-19 NOTE — ED Notes (Signed)
Patient transported to CT 

## 2020-10-19 NOTE — ED Notes (Signed)
X-ray at bedside

## 2020-10-19 NOTE — Discharge Instructions (Addendum)
Work up today revealed urinary tract infection Prior uti diagnosed 6/26 grew our klebsiella which is resistant to many antibiotics.  It is susceptible to cipro.  She was given the first dose of cipro here and a prescription is sent with her. Her liver enzymes have returned to normal. Her sodium was slightly elevated at 147. She appears stable to return to her facility.

## 2020-10-19 NOTE — ED Notes (Addendum)
[facility] called for transport back to [facility]

## 2020-10-19 NOTE — ED Provider Notes (Signed)
[facility]-EM[facility]GENCY DEPT Provider Note   CSN: [id number] Arrival date & time: 10/19/20  1514     History Chief Complaint  Patient presents with   Altered Mental Status    Tonya Baker is a 70 y.o. female.  HPI  70 year old female history of morbid obesity, CHF, type 2 diabetes, presents today from nursing home with reports that she has had increasing confusion for the past several weeks.  EMS reports that they were told that she had been gradually having more confusion.  Did not report any lateralized deficits.  They report that the patient seemed fairly confused on their arrival but appears to have had some clearing.  Is able to tell me what the date is and that she was confused earlier.  Review of records reveal evaluation for altered mental status here on June 26.  That time there was some thought that there was possible access to drugs or alcohol.  She was noted to have elevated liver enzymes, positive opiates and benzodiazepines which are part of her medications.  Patient denies any headache, head injury, chest pain, cough, dyspnea, nausea, vomiting, or diarrhea.  She denies any UTI symptoms.  History reviewed. No pertinent past medical history.  There are no problems to display for this patient.   History reviewed. No pertinent surgical history.   OB History   No obstetric history on file.     No family history on file.     Home Medications Prior to Admission medications   Medication Sig Start Date End Date Taking? Authorizing Provider  acetaminophen (TYLENOL) 325 MG tablet Take 650 mg by mouth every 6 (six) hours as needed (pain).    [provider]  albuterol (PROVENTIL) (2.5 MG/3ML) 0.083% nebulizer solution Take 2.5 mg by nebulization every 8 (eight) hours as needed (congestion).    [provider]  alum & mag hydroxide-simeth (MAALOX PLUS) 400-400-40 MG/5ML suspension Take 30 mLs by mouth every 6 (six) hours as needed for  indigestion.    [provider]  ascorbic acid (VITAMIN C) 500 MG tablet Take 500 mg by mouth every morning.    [provider]  aspirin 81 MG chewable tablet Chew 81 mg by mouth every morning.    [provider]  atorvastatin (LIPITOR) 40 MG tablet Take 40 mg by mouth at bedtime.    [provider]  bisacodyl (DULCOLAX) 10 MG suppository Place 10 mg rectally daily as needed (constipation).    [provider]  camphor-menthol Wynelle Fanny) lotion Apply 1 application topically every 12 (twelve) hours.    [provider]  cholecalciferol (VITAMIN D3) 25 MCG (1000 UNIT) tablet Take 1,000 Units by mouth every morning.    [provider]  cyclobenzaprine (FLEXERIL) 10 MG tablet Take 10 mg by mouth 3 (three) times daily.    [provider]  docusate sodium (COLACE) 100 MG capsule Take 100 mg by mouth every morning.    [provider]  Dulaglutide (TRULICITY) 0.75 MG/0.5ML SOPN Inject 0.75 mg into the skin every Monday.    [provider]  famotidine (PEPCID) 20 MG tablet Take 20 mg by mouth every 12 (twelve) hours. For rash/itching    [provider]  ferrous sulfate 325 (65 FE) MG tablet Take 325 mg by mouth every morning.    [provider]  fluocinonide cream (LIDEX) 0.05 % Apply 1 application topically See admin instructions. Apply topically every day and night shift for dry skin, may also apply every  12 hours as needed for severe xerosis    [provider]  furosemide (LASIX) 40 MG tablet Take 40 mg by mouth every morning.    [provider]  gabapentin (NEURONTIN) 300 MG capsule Take 600 mg by mouth 3 (three) times daily.    [provider]  insulin aspart (NOVOLOG FLEXPEN) 100 UNIT/ML FlexPen Inject 2-12 Units into the skin See admin instructions. Inject 2-12 units subcutaneously three times daily per sliding scale: CBG 151-200 2 units, 201-250 4 units, 251-300 6 units,  301-350 8 units, 351-400 10 units, >400 give 12 units and call MD    [provider]  Insulin Glargine-yfgn 100 UNIT/ML SOPN Inject 60 Units into the skin every morning.    [provider]  loperamide (IMODIUM A-D) 2 MG tablet Take 2 mg by mouth every 6 (six) hours as needed for diarrhea or loose stools.    [provider]  magnesium citrate SOLN Take 30 mLs by mouth every 12 (twelve) hours as needed (constipation).    [provider]  metoCLOPramide (REGLAN) 5 MG tablet Take 5 mg by mouth 4 (four) times daily -  before meals and at bedtime.    [provider]  midodrine (PROAMATINE) 10 MG tablet Take 10 mg by mouth 3 (three) times daily.    [provider]  mineral oil (FLEET OIL) enema Place 1 enema rectally daily as needed (impaction).    [provider]  morphine (MSIR) 15 MG tablet Take 15 mg by mouth every 8 (eight) hours. scheduled    [provider]  Multiple Vitamin (MULTIVITAMIN WITH MINERALS) TABS tablet Take 1 tablet by mouth every morning.    [provider]  ondansetron (ZOFRAN) 4 MG tablet Take 4 mg by mouth every 4 (four) hours as needed for nausea or vomiting.    [provider]  oxyCODONE (ROXICODONE) 15 MG immediate release tablet Take 15 mg by mouth every 8 (eight) hours as needed for pain.    [provider]  pantoprazole (PROTONIX) 40 MG tablet Take 40 mg by mouth 2 (two) times daily.    [provider]  polyethylene glycol (MIRALAX / GLYCOLAX) 17 g packet Take 17 g by mouth every morning.    [provider]  potassium chloride SA (KLOR-CON) 20 MEQ tablet Take 20 mEq by mouth every morning.    [provider]  sertraline (ZOLOFT) 25 MG tablet Take 12.5 mg by mouth every morning.    [provider]  Skin Protectants, Misc. (EUCERIN) cream Apply 1 application topically daily.    [provider]  temazepam (RESTORIL) 30 MG capsule Take 30  mg by mouth at bedtime.    [provider]    Allergies    Cortisone, Hydrocortisone, and Latex  Review of Systems   Review of Systems  All other systems reviewed and are negative.  Physical Exam Updated Vital Signs BP 123/69 (BP Location: Left Arm)   Pulse 89   Temp 99.5 F (37.5 C) (Rectal)   Resp 18   SpO2 98%   Physical Exam Vitals and nursing note reviewed.  Constitutional:      Appearance: Normal appearance. She is obese. She is not ill-appearing.  HENT:     Head: Normocephalic.     Right Ear: External ear normal.     Left Ear: External ear normal.     Mouth/Throat:     Mouth: Mucous membranes are moist.     Pharynx: Oropharynx is  clear.  Eyes:     Extraocular Movements: Extraocular movements intact.     Pupils: Pupils are equal, round, and reactive to light.     Comments: Decreased vision left eye from she reports this is secondary to cataract  Cardiovascular:     Rate and Rhythm: Normal rate and regular rhythm.  Pulmonary:     Effort: Pulmonary effort is normal.     Breath sounds: Normal breath sounds.  Abdominal:     General: Bowel sounds are normal. There is no distension.     Palpations: Abdomen is soft. There is no mass.     Tenderness: There is no abdominal tenderness.  Genitourinary:    Rectum: Normal.  Musculoskeletal:        General: Normal range of motion.     Cervical back: Normal range of motion.  Skin:    Capillary Refill: Capillary refill takes less than 2 seconds.     Comments: Diffuse erythematous rash no mucous membrane involvement  Neurological:     General: No focal deficit present.     Mental Status: She is alert.     Cranial Nerves: No cranial nerve deficit.     Motor: No weakness.     Coordination: Coordination normal.     Gait: Gait normal.     Deep Tendon Reflexes: Reflexes normal.  Psychiatric:        Mood and Affect: Mood normal.    ED Results / Procedures / Treatments   Labs (all labs ordered are listed, but  only abnormal results are displayed) Labs Reviewed  COMPREHENSIVE METABOLIC PANEL - Abnormal; Notable for the following components:      Result Value   Sodium 147 (*)    Creatinine, Ser 1.13 (*)    Calcium 8.6 (*)    Albumin 3.0 (*)    Alkaline Phosphatase 156 (*)    GFR, Estimated 53 (*)    All other components within normal limits  RAPID URINE DRUG SCREEN, HOSP PERFORMED - Abnormal; Notable for the following components:   Opiates POSITIVE (*)    Benzodiazepines POSITIVE (*)    All other components within normal limits  URINALYSIS, ROUTINE W REFLEX MICROSCOPIC - Abnormal; Notable for the following components:   APPearance HAZY (*)    Nitrite POSITIVE (*)    Leukocytes,Ua MODERATE (*)    WBC, UA >50 (*)    Bacteria, UA RARE (*)    All other components within normal limits  RESP PANEL BY RT-PCR (FLU A&B, COVID) ARPGX2  URINE CULTURE  CBC  AMMONIA  ETHANOL    EKG None  Radiology CT Head Wo Contrast  Result Date: 10/19/2020 CLINICAL DATA:  Increased confusion for 2 weeks, left leg pain for 2 days EXAM: CT HEAD WITHOUT CONTRAST TECHNIQUE: Contiguous axial images were obtained from the base of the skull through the vertex without intravenous contrast. COMPARISON:  09/29/2020 FINDINGS: Brain: No acute infarct or hemorrhage. Lateral ventricles and midline structures are stable. No acute extra-axial fluid collections. No mass effect. Vascular: No hyperdense vessel or unexpected calcification. Skull: Normal. Negative for fracture or focal lesion. Sinuses/Orbits: No acute finding. Other: None. IMPRESSION: 1. No acute intracranial process. Electronically Signed   By: Sharlet SalinaMichael  Brown M.D.   On: 10/19/2020 16:06   DG Chest Port 1 View  Result Date: 10/19/2020 CLINICAL DATA:  Confusion. EXAM: PORTABLE CHEST 1 VIEW COMPARISON:  06/01/2020 FINDINGS: Cardiomediastinal silhouette is normal. Mediastinal contours appear intact. Tortuosity of the aorta. There is no evidence of focal  airspace  consolidation, pleural effusion or pneumothorax. Osseous structures are without acute abnormality. Soft tissues are grossly normal. IMPRESSION: No active disease. Electronically Signed   By: Ted Mcalpine M.D.   On: 10/19/2020 15:47    Procedures Procedures   Medications Ordered in ED Medications - No data to display  ED Course  I have reviewed the triage vital signs and the nursing notes.  Pertinent labs & imaging results that were available during my care of the patient were reviewed by me and considered in my medical decision making (see chart for details).    MDM Rules/Calculators/A&P                          70 year old female presents today with some increasing confusion over the past several weeks.  On evaluation here work-up is consistent with urinary tract infection.  Review of labs with urine culture from June 26 revealed that she grew out Klebsiella and this is sensitive to Cipro.  Review of records from that visit show that she was treated with Keflex.  Here she is given a dose of Cipro and plan to treat her with Cipro outpatient. Today creatinine has decreased from 1.37-1.13 from first prior of June 26 Sodium is increased from 1 41-1 47 today Liver enzymes have normalized CBC is normal Patient is awake and alert and appears improved from prehospital complaints of confusion.  Plan discharge to home with Cipro for UTI. Patient had 1 soft blood pressure with systolic blood pressures in the 90s.  Due to this a Actiq acid is obtained.  This is less than 2.  She has received some fluid and blood pressures are 118/66 she appears stable for discharge. Discussed with her niece per her request. Final Clinical Impression(s) / ED Diagnoses Final diagnoses:  Transient alteration of awareness  Urinary tract infection without hematuria, site unspecified    Rx / DC Orders ED Discharge Orders          Ordered    ciprofloxacin (CIPRO) 500 MG tablet  2 times daily        10/19/20  1759          Work up today revealed urinary tract infection Prior uti diagnosed 6/26 grew our klebsiella which is resistant to many antibiotics.  It is susceptible to cipro.  She was given the first dose of cipro here and a prescription is sent with her. Her liver enzymes have returned to normal. Her sodium was slightly elevated at 147. She appears stable to return to her facility.   Margarita Grizzle, MD 10/19/20 [phone]

## 2020-10-22 LAB — URINE CULTURE: Culture: >=100000 — AB

## 2020-10-23 ENCOUNTER — Telehealth: Payer: Self-pay

## 2020-10-23 NOTE — Telephone Encounter (Signed)
Post ED Visit - Positive Culture Follow-up: Successful Patient Follow-Up  Culture assessed and recommendations reviewed by:  []  , Pharm.D. []  Enzo Bi, Pharm.D., BCPS AQ-ID []  , Pharm.D., BCPS []  Celedonio Miyamoto, .D., BCPS []  [place], .D., BCPS, AAHIVP []  Georgina Pillion, Pharm.D., BCPS, AAHIVP []  [address], PharmD, BCPS []  , PharmD, BCPS []  [place], PharmD, BCPS []  [address], PharmD  Positive urine culture  [x]  Patient discharged with Ciprofloxacin and Doxycycline Hyclate  Changes discussed with ED provider: , MD Hoaglund instructions to continue Cipro 500mg  BID until 10/24/20. Recommendations for nursing home provider to assess the need for schedule temazepam with continued AMS/confusion. If still indicated, recommend reducing temazepam dose and changing to as needed at bedtime.   Faxed to [facility]   [facility] , date 10/23/2020, time 10:05 am   Phillips Climes 10/23/2020, 10:08 AM

## 2020-10-23 NOTE — Progress Notes (Signed)
ED Antimicrobial Stewardship Positive Culture Follow Up   Tonya Baker is an 70 y.o. female who presented to [facility] on 10/19/2020 with a chief complaint of  Chief Complaint  Patient presents with   Altered Mental Status    Recent Results (from the past 720 hour(s))  Resp Panel by RT-PCR (Flu A&B, Covid) Nasopharyngeal Swab     Status: None   Collection Time: 09/29/20  5:06 PM   Specimen: Nasopharyngeal Swab; Nasopharyngeal(NP) swabs in vial transport medium  Result Value Ref Range Status   SARS Coronavirus 2 by RT PCR NEGATIVE NEGATIVE Final    Comment: (NOTE) SARS-CoV-2 target nucleic acids are NOT DETECTED.  The SARS-CoV-2 RNA is generally detectable in upper respiratory specimens during the acute phase of infection. The lowest concentration of SARS-CoV-2 viral copies this assay can detect is 138 copies/mL. A negative result does not preclude SARS-Cov-2 infection and should not be used as the sole basis for treatment or other patient management decisions. A negative result may occur with  improper specimen collection/handling, submission of specimen other than nasopharyngeal swab, presence of viral mutation(s) within the areas targeted by this assay, and inadequate number of viral copies(<138 copies/mL). A negative result must be combined with clinical observations, patient history, and epidemiological information. The expected result is Negative.  Fact Sheet for Patients:  BloggerCourse.com  Fact Sheet for Healthcare Providers:  SeriousBroker.it  This test is no t yet approved or cleared by the [place] FDA and  has been authorized for detection and/or diagnosis of SARS-CoV-2 by FDA under an Emergency Use Authorization (EUA). This EUA will remain  in effect (meaning this test can be used) for the duration of the COVID-19 declaration under Section 564(b)(1) of the Act, 21 U.S.C.section 360bbb-3(b)(1), unless the  authorization is terminated  or revoked sooner.       Influenza A by PCR NEGATIVE NEGATIVE Final   Influenza B by PCR NEGATIVE NEGATIVE Final    Comment: (NOTE) The Xpert Xpress SARS-CoV-2/FLU/RSV plus assay is intended as an aid in the diagnosis of influenza from Nasopharyngeal swab specimens and should not be used as a sole basis for treatment. Nasal washings and aspirates are unacceptable for Xpert Xpress SARS-CoV-2/FLU/RSV testing.  Fact Sheet for Patients: BloggerCourse.com  Fact Sheet for Healthcare Providers: SeriousBroker.it  This test is not yet approved or cleared by the [place] FDA and has been authorized for detection and/or diagnosis of SARS-CoV-2 by FDA under an Emergency Use Authorization (EUA). This EUA will remain in effect (meaning this test can be used) for the duration of the COVID-19 declaration under Section 564(b)(1) of the Act, 21 U.S.C. section 360bbb-3(b)(1), unless the authorization is terminated or revoked.  Performed at [facility], [address] [place], [place] [address]   Urine culture     Status: Abnormal   Collection Time: 09/29/20  8:33 PM   Specimen: Urine, Random  Result Value Ref Range Status   Specimen Description   Final    URINE, RANDOM Performed at [facility], [address], [place], [place] [address]    Special Requests   Final    NONE Performed at [facility], [address] [place], [place] [address]    Culture (A)  Final    >=100,000 COLONIES/mL KLEBSIELLA PNEUMONIAE Confirmed Extended Spectrum Beta-Lactamase Producer (ESBL).  In bloodstream infections from ESBL organisms, carbapenems are preferred over piperacillin/tazobactam. They are shown to have a lower risk of mortality.    Report  Status 10/02/2020 FINAL  Final   Organism ID, Bacteria KLEBSIELLA PNEUMONIAE (A)  Final      Susceptibility    Klebsiella pneumoniae - MIC*    AMPICILLIN >=32 RESISTANT Resistant     CEFAZOLIN >=64 RESISTANT Resistant     CEFEPIME >=32 RESISTANT Resistant     CEFTRIAXONE >=64 RESISTANT Resistant     CIPROFLOXACIN 1 SENSITIVE Sensitive     GENTAMICIN >=16 RESISTANT Resistant     IMIPENEM <=0.25 SENSITIVE Sensitive     NITROFURANTOIN 64 INTERMEDIATE Intermediate     TRIMETH/SULFA <=20 SENSITIVE Sensitive     AMPICILLIN/SULBACTAM >=32 RESISTANT Resistant     PIP/TAZO 32 INTERMEDIATE Intermediate     * >=100,000 COLONIES/mL KLEBSIELLA PNEUMONIAE  Resp Panel by RT-PCR (Flu A&B, Covid) Nasopharyngeal Swab     Status: None   Collection Time: 10/19/20  3:30 PM   Specimen: Nasopharyngeal Swab; Nasopharyngeal(NP) swabs in vial transport medium  Result Value Ref Range Status   SARS Coronavirus 2 by RT PCR NEGATIVE NEGATIVE Final    Comment: (NOTE) SARS-CoV-2 target nucleic acids are NOT DETECTED.  The SARS-CoV-2 RNA is generally detectable in upper respiratory specimens during the acute phase of infection. The lowest concentration of SARS-CoV-2 viral copies this assay can detect is 138 copies/mL. A negative result does not preclude SARS-Cov-2 infection and should not be used as the sole basis for treatment or other patient management decisions. A negative result may occur with  improper specimen collection/handling, submission of specimen other than nasopharyngeal swab, presence of viral mutation(s) within the areas targeted by this assay, and inadequate number of viral copies(<138 copies/mL). A negative result must be combined with clinical observations, patient history, and epidemiological information. The expected result is Negative.  Fact Sheet for Patients:  BloggerCourse.com  Fact Sheet for Healthcare Providers:  SeriousBroker.it  This test is no t yet approved or cleared by the [place] FDA and  has been authorized for detection and/or  diagnosis of SARS-CoV-2 by FDA under an Emergency Use Authorization (EUA). This EUA will remain  in effect (meaning this test can be used) for the duration of the COVID-19 declaration under Section 564(b)(1) of the Act, 21 U.S.C.section 360bbb-3(b)(1), unless the authorization is terminated  or revoked sooner.       Influenza A by PCR NEGATIVE NEGATIVE Final   Influenza B by PCR NEGATIVE NEGATIVE Final    Comment: (NOTE) The Xpert Xpress SARS-CoV-2/FLU/RSV plus assay is intended as an aid in the diagnosis of influenza from Nasopharyngeal swab specimens and should not be used as a sole basis for treatment. Nasal washings and aspirates are unacceptable for Xpert Xpress SARS-CoV-2/FLU/RSV testing.  Fact Sheet for Patients: BloggerCourse.com  Fact Sheet for Healthcare Providers: SeriousBroker.it  This test is not yet approved or cleared by the [place] FDA and has been authorized for detection and/or diagnosis of SARS-CoV-2 by FDA under an Emergency Use Authorization (EUA). This EUA will remain in effect (meaning this test can be used) for the duration of the COVID-19 declaration under Section 564(b)(1) of the Act, 21 U.S.C. section 360bbb-3(b)(1), unless the authorization is terminated or revoked.  Performed at [facility], [address], [place], [place] [address]   Urine Culture     Status: Abnormal   Collection Time: 10/19/20  5:00 PM   Specimen: Urine, Catheterized  Result Value Ref Range Status   Specimen Description   Final    URINE, CATHETERIZED Performed at [facility], [address]  [place], [place] [address]    Special Requests   Final    NONE Performed at [facility], [address], [place], [place] [address]    Culture (A)  Final    >=100,000 COLONIES/mL ESCHERICHIA COLI Confirmed Extended Spectrum Beta-Lactamase Producer (ESBL).  In  bloodstream infections from ESBL organisms, carbapenems are preferred over piperacillin/tazobactam. They are shown to have a lower risk of mortality.    Report Status 10/22/2020 FINAL  Final   Organism ID, Bacteria ESCHERICHIA COLI (A)  Final      Susceptibility   Escherichia coli - MIC*    AMPICILLIN >=32 RESISTANT Resistant     CEFAZOLIN >=64 RESISTANT Resistant     CEFEPIME 16 RESISTANT Resistant     CEFTRIAXONE >=64 RESISTANT Resistant     CIPROFLOXACIN <=0.25 SENSITIVE Sensitive     GENTAMICIN >=16 RESISTANT Resistant     IMIPENEM <=0.25 SENSITIVE Sensitive     NITROFURANTOIN <=16 SENSITIVE Sensitive     TRIMETH/SULFA >=320 RESISTANT Resistant     AMPICILLIN/SULBACTAM >=32 RESISTANT Resistant     PIP/TAZO <=4 SENSITIVE Sensitive     * >=100,000 COLONIES/mL ESCHERICHIA COLI    Continue ciprofloxacin 500 mg BID with a [place] stop date of 10/24/20.   We recommended the nursing home provider or PCP to assess the need for scheduled temazepam with patient's continued AMS/confusion.   If still indicated, we recommended reducing temazepam dose and changing to as needed at bedtime.  ED Provider: Frederick Peers, MD  Andree Coss, PharmD Candidate 10/23/2020, 9:47 AM

## 2020-12-28 ENCOUNTER — Emergency Department (HOSPITAL_COMMUNITY): Payer: Medicare Other

## 2020-12-28 ENCOUNTER — Other Ambulatory Visit: Payer: Self-pay

## 2020-12-28 ENCOUNTER — Inpatient Hospital Stay (HOSPITAL_COMMUNITY)
Admission: EM | Admit: 2020-12-28 | Discharge: 2021-01-03 | DRG: 871 | Disposition: A | Discharge status: Skilled Nursing Facility | Payer: Medicare Other | Source: Skilled Nursing Facility | Attending: Internal Medicine | Admitting: Internal Medicine

## 2020-12-28 ENCOUNTER — Encounter (HOSPITAL_COMMUNITY): Payer: Self-pay | Admitting: Pulmonary Disease

## 2020-12-28 DIAGNOSIS — R6521 Severe sepsis with septic shock: Secondary | ICD-10-CM | POA: Diagnosis present

## 2020-12-28 DIAGNOSIS — R7881 Bacteremia: Secondary | ICD-10-CM | POA: Diagnosis not present

## 2020-12-28 DIAGNOSIS — M4620 Osteomyelitis of vertebra, site unspecified: Secondary | ICD-10-CM | POA: Diagnosis not present

## 2020-12-28 DIAGNOSIS — M549 Dorsalgia, unspecified: Secondary | ICD-10-CM | POA: Diagnosis present

## 2020-12-28 DIAGNOSIS — N179 Acute kidney failure, unspecified: Secondary | ICD-10-CM | POA: Diagnosis present

## 2020-12-28 DIAGNOSIS — R0602 Shortness of breath: Secondary | ICD-10-CM | POA: Diagnosis not present

## 2020-12-28 DIAGNOSIS — I5032 Chronic diastolic (congestive) heart failure: Secondary | ICD-10-CM | POA: Diagnosis present

## 2020-12-28 DIAGNOSIS — N1831 Chronic kidney disease, stage 3a: Secondary | ICD-10-CM | POA: Diagnosis present

## 2020-12-28 DIAGNOSIS — Z794 Long term (current) use of insulin: Secondary | ICD-10-CM

## 2020-12-28 DIAGNOSIS — G8929 Other chronic pain: Secondary | ICD-10-CM | POA: Diagnosis present

## 2020-12-28 DIAGNOSIS — Z20822 Contact with and (suspected) exposure to covid-19: Secondary | ICD-10-CM | POA: Diagnosis present

## 2020-12-28 DIAGNOSIS — M21372 Foot drop, left foot: Secondary | ICD-10-CM | POA: Diagnosis present

## 2020-12-28 DIAGNOSIS — E1169 Type 2 diabetes mellitus with other specified complication: Secondary | ICD-10-CM | POA: Diagnosis present

## 2020-12-28 DIAGNOSIS — G4733 Obstructive sleep apnea (adult) (pediatric): Secondary | ICD-10-CM | POA: Diagnosis present

## 2020-12-28 DIAGNOSIS — Z792 Long term (current) use of antibiotics: Secondary | ICD-10-CM | POA: Diagnosis not present

## 2020-12-28 DIAGNOSIS — E1140 Type 2 diabetes mellitus with diabetic neuropathy, unspecified: Secondary | ICD-10-CM

## 2020-12-28 DIAGNOSIS — I130 Hypertensive heart and chronic kidney disease with heart failure and stage 1 through stage 4 chronic kidney disease, or unspecified chronic kidney disease: Secondary | ICD-10-CM | POA: Diagnosis present

## 2020-12-28 DIAGNOSIS — D696 Thrombocytopenia, unspecified: Secondary | ICD-10-CM | POA: Diagnosis present

## 2020-12-28 DIAGNOSIS — Z8614 Personal history of Methicillin resistant Staphylococcus aureus infection: Secondary | ICD-10-CM

## 2020-12-28 DIAGNOSIS — Z79899 Other long term (current) drug therapy: Secondary | ICD-10-CM

## 2020-12-28 DIAGNOSIS — M4800 Spinal stenosis, site unspecified: Secondary | ICD-10-CM

## 2020-12-28 DIAGNOSIS — E1122 Type 2 diabetes mellitus with diabetic chronic kidney disease: Secondary | ICD-10-CM | POA: Diagnosis present

## 2020-12-28 DIAGNOSIS — M4805 Spinal stenosis, thoracolumbar region: Secondary | ICD-10-CM

## 2020-12-28 DIAGNOSIS — I252 Old myocardial infarction: Secondary | ICD-10-CM | POA: Diagnosis not present

## 2020-12-28 DIAGNOSIS — Z7982 Long term (current) use of aspirin: Secondary | ICD-10-CM

## 2020-12-28 DIAGNOSIS — Z6841 Body Mass Index (BMI) 40.0 and over, adult: Secondary | ICD-10-CM

## 2020-12-28 DIAGNOSIS — E1143 Type 2 diabetes mellitus with diabetic autonomic (poly)neuropathy: Secondary | ICD-10-CM | POA: Diagnosis present

## 2020-12-28 DIAGNOSIS — A409 Streptococcal sepsis, unspecified: Principal | ICD-10-CM | POA: Diagnosis present

## 2020-12-28 DIAGNOSIS — K3184 Gastroparesis: Secondary | ICD-10-CM | POA: Diagnosis present

## 2020-12-28 DIAGNOSIS — M5410 Radiculopathy, site unspecified: Secondary | ICD-10-CM | POA: Diagnosis present

## 2020-12-28 DIAGNOSIS — T847XXA Infection and inflammatory reaction due to other internal orthopedic prosthetic devices, implants and grafts, initial encounter: Secondary | ICD-10-CM | POA: Diagnosis not present

## 2020-12-28 DIAGNOSIS — B955 Unspecified streptococcus as the cause of diseases classified elsewhere: Secondary | ICD-10-CM

## 2020-12-28 DIAGNOSIS — M4626 Osteomyelitis of vertebra, lumbar region: Secondary | ICD-10-CM

## 2020-12-28 DIAGNOSIS — I959 Hypotension, unspecified: Secondary | ICD-10-CM | POA: Diagnosis present

## 2020-12-28 DIAGNOSIS — I9589 Other hypotension: Secondary | ICD-10-CM | POA: Diagnosis present

## 2020-12-28 DIAGNOSIS — M546 Pain in thoracic spine: Secondary | ICD-10-CM | POA: Diagnosis present

## 2020-12-28 DIAGNOSIS — R579 Shock, unspecified: Secondary | ICD-10-CM | POA: Diagnosis not present

## 2020-12-28 DIAGNOSIS — N183 Chronic kidney disease, stage 3 unspecified: Secondary | ICD-10-CM | POA: Diagnosis present

## 2020-12-28 DIAGNOSIS — A419 Sepsis, unspecified organism: Secondary | ICD-10-CM | POA: Diagnosis not present

## 2020-12-28 DIAGNOSIS — Z87440 Personal history of urinary (tract) infections: Secondary | ICD-10-CM

## 2020-12-28 DIAGNOSIS — M48061 Spinal stenosis, lumbar region without neurogenic claudication: Secondary | ICD-10-CM | POA: Diagnosis present

## 2020-12-28 DIAGNOSIS — G062 Extradural and subdural abscess, unspecified: Secondary | ICD-10-CM | POA: Diagnosis present

## 2020-12-28 DIAGNOSIS — M4804 Spinal stenosis, thoracic region: Secondary | ICD-10-CM | POA: Diagnosis present

## 2020-12-28 DIAGNOSIS — Z981 Arthrodesis status: Secondary | ICD-10-CM

## 2020-12-28 DIAGNOSIS — I2510 Atherosclerotic heart disease of native coronary artery without angina pectoris: Secondary | ICD-10-CM | POA: Diagnosis present

## 2020-12-28 DIAGNOSIS — E8770 Fluid overload, unspecified: Secondary | ICD-10-CM | POA: Diagnosis present

## 2020-12-28 HISTORY — DX: Long term (current) use of insulin: Z79.4

## 2020-12-28 HISTORY — DX: Type 2 diabetes mellitus with diabetic autonomic (poly)neuropathy: E11.43

## 2020-12-28 HISTORY — DX: Dorsalgia, unspecified: M54.9

## 2020-12-28 HISTORY — DX: Obstructive sleep apnea (adult) (pediatric): G47.33

## 2020-12-28 LAB — CBC WITH DIFFERENTIAL/PLATELET
Abs Immature Granulocytes: 0.14 10*3/uL — ABNORMAL HIGH (ref 0.00–0.07)
Basophils Absolute: 0 10*3/uL (ref 0.0–0.1)
Basophils Relative: 0 %
Eosinophils Absolute: 0 10*3/uL (ref 0.0–0.5)
Eosinophils Relative: 0 %
HCT: 39.2 % (ref 36.0–46.0)
Hemoglobin: 12.6 g/dL (ref 12.0–15.0)
Immature Granulocytes: 1 %
Lymphocytes Relative: 6 %
Lymphs Abs: 0.8 10*3/uL (ref 0.7–4.0)
MCH: 29.9 pg (ref 26.0–34.0)
MCHC: 32.1 g/dL (ref 30.0–36.0)
MCV: 93.1 fL (ref 80.0–100.0)
Monocytes Absolute: 0.7 10*3/uL (ref 0.1–1.0)
Monocytes Relative: 5 %
Neutro Abs: 12.2 10*3/uL — ABNORMAL HIGH (ref 1.7–7.7)
Neutrophils Relative %: 88 %
Platelets: 155 10*3/uL (ref 150–400)
RBC: 4.21 MIL/uL (ref 3.87–5.11)
RDW: 13.3 % (ref 11.5–15.5)
WBC: 13.9 10*3/uL — ABNORMAL HIGH (ref 4.0–10.5)
nRBC: 0 % (ref 0.0–0.2)

## 2020-12-28 LAB — LACTIC ACID, PLASMA: Lactic Acid, Venous: 1.8 mmol/L (ref 0.5–1.9)

## 2020-12-28 LAB — CBC
HCT: 33.3 % — ABNORMAL LOW (ref 36.0–46.0)
Hemoglobin: 10.6 g/dL — ABNORMAL LOW (ref 12.0–15.0)
MCH: 29.8 pg (ref 26.0–34.0)
MCHC: 31.8 g/dL (ref 30.0–36.0)
MCV: 93.5 fL (ref 80.0–100.0)
Platelets: 127 10*3/uL — ABNORMAL LOW (ref 150–400)
RBC: 3.56 MIL/uL — ABNORMAL LOW (ref 3.87–5.11)
RDW: 13.2 % (ref 11.5–15.5)
WBC: 16.4 10*3/uL — ABNORMAL HIGH (ref 4.0–10.5)
nRBC: 0 % (ref 0.0–0.2)

## 2020-12-28 LAB — RESP PANEL BY RT-PCR (FLU A&B, COVID) ARPGX2
Influenza A by PCR: NEGATIVE
Influenza B by PCR: NEGATIVE
SARS Coronavirus 2 by RT PCR: NEGATIVE

## 2020-12-28 LAB — COMPREHENSIVE METABOLIC PANEL
ALT: 14 U/L (ref 0–44)
AST: 19 U/L (ref 15–41)
Albumin: 2.3 g/dL — ABNORMAL LOW (ref 3.5–5.0)
Alkaline Phosphatase: 137 U/L — ABNORMAL HIGH (ref 38–126)
Anion gap: 10 (ref 5–15)
BUN: 24 mg/dL — ABNORMAL HIGH (ref 8–23)
CO2: 22 mmol/L (ref 22–32)
Calcium: 8.3 mg/dL — ABNORMAL LOW (ref 8.9–10.3)
Chloride: 101 mmol/L (ref 98–111)
Creatinine, Ser: 1.3 mg/dL — ABNORMAL HIGH (ref 0.44–1.00)
GFR, Estimated: 45 mL/min — ABNORMAL LOW (ref >60)
Glucose, Bld: 164 mg/dL — ABNORMAL HIGH (ref 70–99)
Potassium: 3.5 mmol/L (ref 3.5–5.1)
Sodium: 133 mmol/L — ABNORMAL LOW (ref 135–145)
Total Bilirubin: 1.3 mg/dL — ABNORMAL HIGH (ref 0.3–1.2)
Total Protein: 6.2 g/dL — ABNORMAL LOW (ref 6.5–8.1)

## 2020-12-28 LAB — PROTIME-INR
INR: 1.3 — ABNORMAL HIGH (ref 0.8–1.2)
Prothrombin Time: 16.3 seconds — ABNORMAL HIGH (ref 11.4–15.2)

## 2020-12-28 LAB — SEDIMENTATION RATE: Sed Rate: 91 mm/hr — ABNORMAL HIGH (ref 0–22)

## 2020-12-28 LAB — C-REACTIVE PROTEIN: CRP: 29 mg/dL — ABNORMAL HIGH (ref <1.0)

## 2020-12-28 LAB — APTT: aPTT: 34 seconds (ref 24–36)

## 2020-12-28 MED ORDER — ASPIRIN 81 MG PO CHEW
81.0000 mg | CHEWABLE_TABLET | Freq: Every morning | ORAL | Status: DC
  Administered 2020-12-29 – 2021-01-03 (×6): 81 mg via ORAL
  Filled 2020-12-28 (×6): qty 1

## 2020-12-28 MED ORDER — ONDANSETRON 8 MG PO TBDP
8.0000 mg | ORAL_TABLET | Freq: Once | ORAL | Status: AC
  Administered 2020-12-28: 8 mg via ORAL
  Filled 2020-12-28: qty 1

## 2020-12-28 MED ORDER — PANTOPRAZOLE SODIUM 40 MG IV SOLR
40.0000 mg | Freq: Every day | INTRAVENOUS | Status: DC
  Administered 2020-12-28: 40 mg via INTRAVENOUS
  Filled 2020-12-28: qty 40

## 2020-12-28 MED ORDER — ADULT MULTIVITAMIN W/MINERALS CH
1.0000 | ORAL_TABLET | Freq: Every morning | ORAL | Status: DC
  Administered 2020-12-29 – 2021-01-03 (×6): 1 via ORAL
  Filled 2020-12-28 (×6): qty 1

## 2020-12-28 MED ORDER — GABAPENTIN 300 MG PO CAPS: 600.0000 mg | ORAL_CAPSULE | Freq: Three times a day (TID) | ORAL | Status: DC

## 2020-12-28 MED ORDER — VANCOMYCIN HCL 1500 MG/300ML IV SOLN
1500.0000 mg | INTRAVENOUS | Status: DC
  Administered 2020-12-30: 1500 mg via INTRAVENOUS
  Filled 2020-12-28: qty 300

## 2020-12-28 MED ORDER — CYCLOBENZAPRINE HCL 10 MG PO TABS: 10.0000 mg | ORAL_TABLET | Freq: Three times a day (TID) | ORAL | Status: DC

## 2020-12-28 MED ORDER — MORPHINE SULFATE (PF) 4 MG/ML IV SOLN: 4.0000 mg | INTRAVENOUS | Status: DC | PRN

## 2020-12-28 MED ORDER — LACTATED RINGERS IV BOLUS
1000.0000 mL | Freq: Once | INTRAVENOUS | Status: AC
  Administered 2020-12-28: 1000 mL via INTRAVENOUS

## 2020-12-28 MED ORDER — HEPARIN SODIUM (PORCINE) 5000 UNIT/ML IJ SOLN
5000.0000 [IU] | Freq: Three times a day (TID) | INTRAMUSCULAR | Status: DC
  Administered 2020-12-28 – 2021-01-01 (×10): 5000 [IU] via SUBCUTANEOUS
  Filled 2020-12-28 (×11): qty 1

## 2020-12-28 MED ORDER — POLYETHYLENE GLYCOL 3350 17 G PO PACK: 17.0000 g | PACK | Freq: Every day | ORAL | Status: DC | PRN

## 2020-12-28 MED ORDER — VANCOMYCIN HCL 2000 MG/400ML IV SOLN
2000.0000 mg | Freq: Once | INTRAVENOUS | Status: AC
  Administered 2020-12-28: 2000 mg via INTRAVENOUS
  Filled 2020-12-28: qty 400

## 2020-12-28 MED ORDER — MIDODRINE HCL 5 MG PO TABS
10.0000 mg | ORAL_TABLET | Freq: Once | ORAL | Status: AC
  Administered 2020-12-28: 10 mg via ORAL
  Filled 2020-12-28: qty 2

## 2020-12-28 MED ORDER — ACETAMINOPHEN 325 MG PO TABS
650.0000 mg | ORAL_TABLET | ORAL | Status: DC | PRN
  Administered 2020-12-29 – 2021-01-03 (×12): 650 mg via ORAL
  Filled 2020-12-28 (×12): qty 2

## 2020-12-28 MED ORDER — SERTRALINE HCL 25 MG PO TABS
12.5000 mg | ORAL_TABLET | Freq: Every day | ORAL | Status: DC
  Administered 2020-12-29 – 2021-01-03 (×6): 12.5 mg via ORAL
  Filled 2020-12-28 (×6): qty 0.5

## 2020-12-28 MED ORDER — MIDODRINE HCL 5 MG PO TABS
10.0000 mg | ORAL_TABLET | Freq: Three times a day (TID) | ORAL | Status: DC
  Administered 2020-12-29: 10 mg via ORAL
  Filled 2020-12-28: qty 2

## 2020-12-28 MED ORDER — NOREPINEPHRINE 4 MG/250ML-% IV SOLN
0.0000 ug/min | INTRAVENOUS | Status: DC
Start: 2020-12-28 — End: 2021-01-01
  Administered 2020-12-28: 2 ug/min via INTRAVENOUS
  Administered 2020-12-29: 3 ug/min via INTRAVENOUS
  Administered 2020-12-30: 4 ug/min via INTRAVENOUS
  Filled 2020-12-28 (×4): qty 250

## 2020-12-28 MED ORDER — OXYCODONE-ACETAMINOPHEN 5-325 MG PO TABS
1.0000 | ORAL_TABLET | Freq: Once | ORAL | Status: AC
Start: 2020-12-28 — End: 2020-12-28
  Administered 2020-12-28: 1 via ORAL
  Filled 2020-12-28: qty 1

## 2020-12-28 MED ORDER — METOCLOPRAMIDE HCL 10 MG PO TABS
5.0000 mg | ORAL_TABLET | Freq: Three times a day (TID) | ORAL | Status: DC
  Administered 2020-12-29: 5 mg via ORAL
  Filled 2020-12-28 (×2): qty 0.5

## 2020-12-28 MED ORDER — ALBUTEROL SULFATE (2.5 MG/3ML) 0.083% IN NEBU
2.5000 mg | INHALATION_SOLUTION | Freq: Three times a day (TID) | RESPIRATORY_TRACT | Status: DC | PRN
  Administered 2020-12-30: 2.5 mg via RESPIRATORY_TRACT
  Filled 2020-12-28: qty 3

## 2020-12-28 MED ORDER — METRONIDAZOLE 500 MG/100ML IV SOLN
500.0000 mg | Freq: Two times a day (BID) | INTRAVENOUS | Status: DC
  Administered 2020-12-29: 500 mg via INTRAVENOUS
  Filled 2020-12-28: qty 100

## 2020-12-28 MED ORDER — METRONIDAZOLE 500 MG/100ML IV SOLN
500.0000 mg | Freq: Once | INTRAVENOUS | Status: AC
  Administered 2020-12-28: 500 mg via INTRAVENOUS
  Filled 2020-12-28: qty 100

## 2020-12-28 MED ORDER — ATORVASTATIN CALCIUM 40 MG PO TABS
40.0000 mg | ORAL_TABLET | Freq: Every day | ORAL | Status: DC
  Administered 2020-12-29 – 2021-01-03 (×7): 40 mg via ORAL
  Filled 2020-12-28 (×7): qty 1

## 2020-12-28 MED ORDER — LORAZEPAM 0.5 MG PO TABS
0.5000 mg | ORAL_TABLET | Freq: Once | ORAL | Status: AC
  Administered 2020-12-28: 0.5 mg via ORAL
  Filled 2020-12-28: qty 1

## 2020-12-28 MED ORDER — POTASSIUM CHLORIDE IN NACL 20-0.9 MEQ/L-% IV SOLN
INTRAVENOUS | Status: DC
  Filled 2020-12-28: qty 1000

## 2020-12-28 MED ORDER — INSULIN ASPART 100 UNIT/ML IJ SOLN
0.0000 [IU] | INTRAMUSCULAR | Status: DC
  Administered 2020-12-29: 4 [IU] via SUBCUTANEOUS
  Administered 2020-12-29: 7 [IU] via SUBCUTANEOUS
  Administered 2020-12-29: 3 [IU] via SUBCUTANEOUS
  Administered 2020-12-29: 4 [IU] via SUBCUTANEOUS
  Administered 2020-12-29 – 2020-12-30 (×2): 7 [IU] via SUBCUTANEOUS
  Administered 2020-12-30: 11 [IU] via SUBCUTANEOUS
  Administered 2020-12-30 (×2): 4 [IU] via SUBCUTANEOUS
  Administered 2020-12-30: 7 [IU] via SUBCUTANEOUS
  Administered 2020-12-31: 3 [IU] via SUBCUTANEOUS
  Administered 2020-12-31: 4 [IU] via SUBCUTANEOUS
  Administered 2020-12-31 (×2): 3 [IU] via SUBCUTANEOUS
  Administered 2020-12-31 – 2021-01-01 (×3): 4 [IU] via SUBCUTANEOUS
  Administered 2021-01-01: 3 [IU] via SUBCUTANEOUS
  Filled 2020-12-28: qty 0.2

## 2020-12-28 MED ORDER — HYDROCERIN EX CREA
1.0000 "application " | TOPICAL_CREAM | Freq: Every day | CUTANEOUS | Status: DC
  Administered 2020-12-29 – 2021-01-03 (×4): 1 via TOPICAL
  Filled 2020-12-28: qty 113

## 2020-12-28 MED ORDER — SODIUM CHLORIDE 0.9 % IV SOLN: 2.0000 g | Freq: Three times a day (TID) | INTRAVENOUS | Status: DC

## 2020-12-28 MED ORDER — VANCOMYCIN HCL IN DEXTROSE 1-5 GM/200ML-% IV SOLN
1000.0000 mg | Freq: Once | INTRAVENOUS | Status: DC
  Filled 2020-12-28: qty 200

## 2020-12-28 MED ORDER — DOCUSATE SODIUM 100 MG PO CAPS: 100.0000 mg | ORAL_CAPSULE | Freq: Two times a day (BID) | ORAL | Status: DC | PRN

## 2020-12-28 MED ORDER — ONDANSETRON HCL 4 MG/2ML IJ SOLN
4.0000 mg | Freq: Four times a day (QID) | INTRAMUSCULAR | Status: DC | PRN
  Administered 2021-01-01: 4 mg via INTRAVENOUS
  Filled 2020-12-28: qty 2

## 2020-12-28 MED ORDER — SODIUM CHLORIDE 0.9 % IV SOLN
2.0000 g | Freq: Once | INTRAVENOUS | Status: AC
  Administered 2020-12-28: 2 g via INTRAVENOUS
  Filled 2020-12-28: qty 2

## 2020-12-28 NOTE — Progress Notes (Signed)
Pharmacy Antibiotic Note  Tonya Baker is a 70 y.o. 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  Pharmacy has been consulted for vancomycin &  cefepime dosing.  Plan: Vancomycin 2 gm IV x 1 given in ED @ 2045 then vancomycin 1500 mg IV q36 for est AUC 545, Css min 12.3 using SCr 1.3 and Vd 0.5 Cefepime 2 mg IV q12 Flagyl 500 mg IV q12 F/u renal function, WBC, temp, culture data  Height: 5\' 4"  (162.6 cm) Weight: 108.9 kg (240 lb) IBW/kg (Calculated) : 54.7  Temp (24hrs), Avg:99.6 F (37.6 C), Min:98 F (36.7 C), Max:101.2 F (38.4 C)  Recent Labs  Lab 12/28/20 1613 12/28/20 1707 12/28/20 1803  WBC 13.9*  --   --   CREATININE  --  1.30*  --   LATICACIDVEN  --   --  1.8    Estimated Creatinine Clearance: 49.3 mL/min (A) (by C-G formula based on SCr of 1.3 mg/dL (H)).    Allergies  Allergen Reactions   Cortisone Other (See Comments)    Unknown reaction   Hydrocortisone Other (See Comments)    Unknown reaction   Latex Other (See Comments)    Unknown reaction    Antimicrobials this admission: 9/24 vanc>> 9/24 cefepime>> 9/24 flagyl>> Dose adjustments this admission:  Microbiology results: 9/24 BCx2: sent 9/24 covid/flu neg  Thank you for allowing pharmacy to be a part of this patient's care.   10/24, Pharm.D 12/28/2020 11:27 PM

## 2020-12-28 NOTE — Progress Notes (Signed)
Pharmacy Note   A consult was received from an ED physician for vancomycin and cefepimeper pharmacy dosing.    The patient's profile has been reviewed for ht/wt/allergies/indication/available labs.    A one time order has been placed for vancomycin 2000 mg IV x1 and cefepime 2 gr IV x 1    Further antibiotics/pharmacy consults should be ordered by admitting physician if indicated.                       Thank you,  Adalberto Cole, PharmD, BCPS 12/28/2020 8:04 PM

## 2020-12-28 NOTE — ED Notes (Signed)
Called [facility] to transport pt to [facility] 

## 2020-12-28 NOTE — ED Triage Notes (Signed)
Pt BIB EMS from SN[facility]) c/o back pain. Per report pain started 2 days ago and getting worse today. Denies N/V/D.  BP 114/64 HR 80 RR 20 O2sat 98% on RA.

## 2020-12-28 NOTE — ED Provider Notes (Signed)
[facility] DEPT Provider Note   CSN: [id number] Arrival date & time: 12/28/20  1514     History Chief Complaint  Patient presents with   Back Pain    Tonya Baker is a 70 y.o. female.  HPI Patient is a 70 year old female who presents to the emergency department due to low back pain.  Patient states she recently moved to [place] and is staying at an assisted living facility closer to her family.  She states that in March of this year she had a surgery to her lumbar spine in [place].  She states she developed a postop infection and had a second surgery after this occurred and after that second surgery has had no complaints.  She states she has been able to stand and ambulate unassisted pain-free.  She states that about 2 days ago she began developing atraumatic low back pain once again.  Denies any numbness, weakness, bowel or bladder incontinence, fevers, chills, nausea, vomiting, chest pain, shortness of breath.  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    Past Medical History:  Diagnosis Date   Back pain    Obstructive sleep apnea    Type 2 diabetes mellitus with diabetic autonomic neuropathy, with long-term current use of insulin [facility])     Patient Active Problem List   Diagnosis Date Noted   Back pain 12/28/2020   Hypotension 12/28/2020   CKD (chronic kidney disease) stage 3, GFR 30-59 ml/min ([place]) 12/28/2020   Spinal stenosis 12/28/2020     No past surgical history on file.   OB History   No obstetric history on file.     No family history on file.     Home Medications Prior to Admission medications   Medication Sig Start Date End Date Taking? Authorizing Provider  acetaminophen (TYLENOL) 325 MG tablet Take 650 mg by mouth every 6 (six) hours as needed (pain).    [provider]  albuterol (PROVENTIL) (2.5 MG/3ML) 0.083% nebulizer solution Take 2.5 mg by  nebulization every 8 (eight) hours as needed for wheezing or shortness of breath (congestion).    [provider]  alum & mag hydroxide-simeth (MAALOX PLUS) 400-400-40 MG/5ML suspension Take 30 mLs by mouth every 6 (six) hours as needed for indigestion.    [provider]  ascorbic acid (VITAMIN C) 500 MG tablet Take 500 mg by mouth every morning.    [provider]  aspirin 81 MG chewable tablet Chew 81 mg by mouth every morning.    [provider]  atorvastatin (LIPITOR) 40 MG tablet Take 40 mg by mouth at bedtime.    [provider]  bisacodyl (DULCOLAX) 10 MG suppository Place 10 mg rectally daily as needed for mild constipation (constipation).    [provider]  camphor-menthol Timoteo Ace) lotion Apply 1 application topically every 12 (twelve) hours.    [provider]  cholecalciferol (VITAMIN D3) 25 MCG (1000 UNIT) tablet Take 1,000 Units by mouth every morning.    [provider]  ciprofloxacin (CIPRO) 500 MG tablet Take 1 tablet (500 mg total) by mouth 2 (two) times daily. 10/19/20   Pattricia Boss, MD  cyclobenzaprine (FLEXERIL) 10 MG tablet Take 10 mg by mouth 3 (three) times daily.    [provider]  docusate sodium (COLACE) 100 MG capsule Take 100 mg by mouth every morning.    [provider]  doxycycline (VIBRAMYCIN) 100 MG capsule Take 100  mg by mouth 2 (two) times daily. Stat date: 10/03/20    [provider]  Dulaglutide (TRULICITY) 0.81 KG/8.1EH SOPN Inject 0.75 mg into the skin every Monday.    [provider]  famotidine (PEPCID) 20 MG tablet Take 20 mg by mouth every 12 (twelve) hours. For rash/itching    [provider]  ferrous sulfate 325 (65 FE) MG tablet Take 325 mg by mouth every morning.    [provider]  fluocinonide cream (LIDEX) 6.31 % Apply 1 application topically See admin instructions. Apply topically every day and night shift for dry skin, may  also apply every 12 hours as needed for severe xerosis    [provider]  furosemide (LASIX) 40 MG tablet Take 40 mg by mouth every morning.    [provider]  gabapentin (NEURONTIN) 300 MG capsule Take 600 mg by mouth 3 (three) times daily.    [provider]  hydrOXYzine (ATARAX/VISTARIL) 25 MG tablet Take 25 mg by mouth at bedtime.    [provider]  insulin aspart (NOVOLOG FLEXPEN) 100 UNIT/ML FlexPen Inject 0-10 Units into the skin See admin instructions. CBG 70-150 0 unit, 151-200 2 units, 201-250 4 units, 251-300 6 units, 301-350 8 units, 351-400 10 units, >400 give 12 units and call MD subcutaneously three times daily.    [provider]  Insulin Glargine-yfgn 100 UNIT/ML SOPN Inject 60 Units into the skin daily.    [provider]  magnesium citrate SOLN Take 30 mLs by mouth every 12 (twelve) hours as needed for mild constipation (constipation).    [provider]  metoCLOPramide (REGLAN) 5 MG tablet Take 5 mg by mouth See admin instructions. Take 1 tablet by mouth before meals and at bedtime    [provider]  midodrine (PROAMATINE) 10 MG tablet Take 10 mg by mouth 3 (three) times daily.    [provider]  morphine (MSIR) 15 MG tablet Take 15 mg by mouth every 8 (eight) hours. scheduled    [provider]  Multiple Vitamin (MULTIVITAMIN WITH MINERALS) TABS tablet Take 1 tablet by mouth every morning.    [provider]  ondansetron (ZOFRAN) 4 MG tablet Take 4 mg by mouth every 4 (four) hours as needed for nausea or vomiting.    [provider]  oxyCODONE (ROXICODONE) 15 MG immediate release tablet Take 15 mg by mouth every 8 (eight) hours as needed for pain.    [provider]  pantoprazole (PROTONIX) 40 MG tablet Take 40 mg by mouth 2 (two) times daily.    [provider]  polyethylene glycol (MIRALAX / GLYCOLAX) 17 g packet Take 17 g by mouth every morning.     [provider]  potassium chloride SA (KLOR-CON) 20 MEQ tablet Take 20 mEq by mouth every morning.    [provider]  sertraline (ZOLOFT) 25 MG tablet Take 12.5 mg by mouth daily.    [provider]  Skin Protectants, Misc. (EUCERIN) cream Apply 1 application topically daily.    [provider]  temazepam (RESTORIL) 30 MG capsule Take 30 mg by mouth at bedtime.    [provider]    Allergies    Cortisone, Hydrocortisone, and Latex  Review of Systems   Review of Systems  All other systems reviewed and are negative. Ten systems reviewed and are negative for acute change, except as noted in the HPI.   Physical Exam Updated Vital Signs BP 119/73   Pulse 79  Temp (!) 101.2 F (38.4 C) (Rectal)   Resp 18   Ht _0  (1.626 m)   Wt 108.9 kg   SpO2 99%   BMI 41.20 kg/m   Physical Exam Vitals and nursing note reviewed.  Constitutional:      General: She is not in acute distress.    Appearance: Normal appearance. She is not ill-appearing, toxic-appearing or diaphoretic.  HENT:     Head: Normocephalic and atraumatic.     Right Ear: External ear normal.     Left Ear: External ear normal.     Nose: Nose normal.     Mouth/Throat:     Mouth: Mucous membranes are moist.     Pharynx: Oropharynx is clear. No oropharyngeal exudate or posterior oropharyngeal erythema.  Eyes:     Extraocular Movements: Extraocular movements intact.  Cardiovascular:     Rate and Rhythm: Normal rate and regular rhythm.     Pulses: Normal pulses.     Heart sounds: Normal heart sounds. No murmur heard.   No friction rub. No gallop.  Pulmonary:     Effort: Pulmonary effort is normal. No respiratory distress.     Breath sounds: Normal breath sounds. No stridor. No wheezing, rhonchi or rales.  Abdominal:     General: Abdomen is flat.     Palpations: Abdomen is soft.     Tenderness: There is no abdominal tenderness.  Musculoskeletal:        General: Tenderness  present. Normal range of motion.     Cervical back: Normal range of motion and neck supple. No tenderness.     Comments: Well-healing surgical scar noted to the lumbar spine.  Moderate tenderness noted diffusely along the midline lumbar spine as well as the bilateral lumbar paraspinal musculature.  Skin:    General: Skin is warm and dry.  Neurological:     General: No focal deficit present.     Mental Status: She is alert and oriented to person, place, and time.     Comments: Strength is 5/5 in the bilateral lower extremities with plantar flexion and dorsiflexion.  2+ pedal pulses.  Patient able to lift and hold each leg individually off the bed at 45 degrees for 5 seconds.  Distal sensation intact.  Psychiatric:        Mood and Affect: Mood normal.        Behavior: Behavior normal.   ED Results / Procedures / Treatments   Labs (all labs ordered are listed, but only abnormal results are displayed) Labs Reviewed  CBC WITH DIFFERENTIAL/PLATELET - Abnormal; Notable for the following components:      Result Value   WBC 13.9 (*)    Neutro Abs 12.2 (*)    Abs Immature Granulocytes 0.14 (*)    All other components within normal limits  COMPREHENSIVE METABOLIC PANEL - Abnormal; Notable for the following components:   Sodium 133 (*)    Glucose, Bld 164 (*)    BUN 24 (*)    Creatinine, Ser 1.30 (*)    Calcium 8.3 (*)    Total Protein 6.2 (*)    Albumin 2.3 (*)    Alkaline Phosphatase 137 (*)    Total Bilirubin 1.3 (*)    GFR, Estimated 45 (*)    All other components within normal limits  SEDIMENTATION RATE - Abnormal; Notable for the following components:   Sed Rate 91 (*)    All other components within normal limits  C-REACTIVE PROTEIN - Abnormal; Notable for the  following components:   CRP 29.0 (*)    All other components within normal limits  PROTIME-INR - Abnormal; Notable for the following components:   Prothrombin Time 16.3 (*)    INR 1.3 (*)    All other components within  normal limits  RESP PANEL BY RT-PCR (FLU A&B, COVID) ARPGX2  CULTURE, BLOOD (SINGLE)  URINE CULTURE  LACTIC ACID, PLASMA  APTT  URINALYSIS, ROUTINE W REFLEX MICROSCOPIC  RAPID URINE DRUG SCREEN, HOSP PERFORMED  URINALYSIS, COMPLETE (UACMP) WITH MICROSCOPIC   EKG EKG Interpretation  Date/Time:  Saturday December 28 2020 18:09:01 EDT Ventricular Rate:  82 PR Interval:  157 QRS Duration: 97 QT Interval:  382 QTC Calculation: 447 R Axis:   -27 Text Interpretation: Sinus rhythm Borderline left axis deviation Anteroseptal infarct, age indeterminate Confirmed by Godfrey Pick [phone]) on 12/28/2020 6:31:36 PM  Radiology CT Lumbar Spine Wo Contrast  Result Date: 12/28/2020 CLINICAL DATA:  Low back pain, no red flags; lumbar pain; no recent injury; recent surgery in March. EXAM: CT LUMBAR SPINE WITHOUT CONTRAST TECHNIQUE: Multidetector CT imaging of the lumbar spine was performed without intravenous contrast administration. Multiplanar CT image reconstructions were also generated. COMPARISON:  No pertinent prior exams available for comparison. FINDINGS: Segmentation: 5 lumbar vertebrae. The caudal most well-formed intervertebral disc space is designated L5-S1. Alignment: Trace L3-L4 grade 1 retrolisthesis. Trace L5-S1 grade 1 retrolisthesis. Vertebrae: Mild age-indeterminate anterior wedge deformity of the T11 vertebral body. Mild age-indeterminate L2 inferior endplate compression deformity. Age-indeterminate L3 anterior wedge vertebral compression deformity (40 % height loss). A posterior spinal fusion construct extends from the L1 level to the S1 level. Interbody devices at L4-L5 and L5-S1. No appreciable osseous fusion across the L4-L5 and L5-S1 disc spaces. Ventral fusion hardware at L4-L5. There is lucency surrounding the screws within the bilateral sacral ala compatible with hardware loosening. Paraspinal and other soft tissues: Aortic atherosclerosis. Postsurgical changes to the lumbar paraspinal  soft tissues. Disc levels: Multilevel disc space narrowing. Multilevel disc space narrowing. Most notably, there is advanced disc space narrowing on the right at L1-L2 and moderate disc space narrowing at L2-L3 and L3-L4. Multilevel bridging ventrolateral osteophytes. Additionally, there is apparent fusion across the right L1-L2 disc space, and apparent fusion across the bilateral disc spaces at L2-L3 and L3-L4. Streak and beam hardening artifact arising from the posterior spinal fusion construct significantly limits evaluation of the spinal canal and neural foramina at multiple levels. L1-L2: No appreciable significant spinal canal or foraminal stenosis. L2-L3: Suspected post laminotomy changes. Streak and beam hardening artifact precludes adequate evaluation of the spinal canal. No appreciable high-grade neural foraminal narrowing. L3-L4: Post-laminectomy changes. Disc bulge with endplate spurring. Streak and beam hardening artifact precludes adequate evaluation of the spinal canal. Bilateral neural foraminal narrowing (moderate right, mild left). L4-L5: Post-laminectomy changes. Streak and beam hardening artifact precludes adequate evaluation of the spinal canal. No appreciable significant foraminal stenosis. L5-S1: Streak and beam hardening artifact precludes adequate evaluation of the spinal canal. A disc bulge results in apparent bilateral neural foraminal narrowing (mild right, moderate left). IMPRESSION: Mild age-indeterminate anterior wedge vertebral compression deformity of the T11 vertebral body. Mild age-indeterminate L2 inferior endplate vertebral compression deformity. Age-indeterminate L3 anterior wedge vertebral compression deformity (40% height loss). Lumbar spondylosis and postoperative changes, with multilevel spinal fusion, as detailed. Streak and beam hardening artifact arising from spinal fusion hardware precludes evaluation of the spinal canal at multiple levels. Sites of mild and moderate  foraminal stenosis, as detailed. Lucency surrounding the screws within  the bilateral sacral ala compatible with hardware loosening. Electronically Signed   By: Kellie Simmering D.O.   On: 12/28/2020 16:21   MR THORACIC SPINE WO CONTRAST  Result Date: 12/28/2020 CLINICAL DATA:  Initial evaluation for acute mid back pain. EXAM: MRI THORACIC SPINE WITHOUT CONTRAST TECHNIQUE: Multiplanar, multisequence MR imaging of the thoracic spine was performed. No intravenous contrast was administered. COMPARISON:  None available. FINDINGS: Alignment: Examination technically limited by extensive motion artifact. Additionally, no sagittal T1 weighted sequence available for review at time of this dictation. Trace retrolisthesis of T11 on T12. Straightening of the normal thoracic kyphosis elsewhere. Vertebrae: Vertebral body height maintained without acute or chronic fracture. Susceptibility artifact related to prior fusion partially visualized at the upper lumbar spine. Underlying bone marrow signal intensity within normal limits. Benign hemangiomata noted within the T7 and T12 vertebral bodies. No worrisome osseous lesions. No abnormal marrow edema. Cord: Grossly normal signal and morphology on this motion degraded exam. Paraspinal and other soft tissues: Chronic postoperative scarring noted within the posterior paraspinous soft tissues at the partially visualized thoracolumbar junction. Paraspinous soft tissues demonstrate no acute finding. Disc levels: Degenerative spondylosis noted within the partially visualized cervical spine, incompletely assessed on this exam. T1-2:  Unremarkable. T2-3: Negative interspace.  Mild facet hypertrophy.  No stenosis. T3-4:  Negative interspace.  Mild facet hypertrophy.  No stenosis. T4-5: Negative interspace. Left-sided facet hypertrophy. No significant stenosis. T5-6: Negative interspace. Mild to moderate facet hypertrophy. No significant spinal stenosis. Moderate bilateral foraminal narrowing.  T6-7: Minimal disc bulge with reactive endplate spurring. Bilateral facet hypertrophy. No spinal stenosis. Mild bilateral foraminal narrowing. T7-8: Minimal reactive endplate change without significant disc bulge. Bilateral facet arthrosis. No spinal stenosis. Mild bilateral foraminal narrowing. T8-9: Negative interspace. Bilateral facet arthrosis. No spinal stenosis. Mild-to-moderate bilateral foraminal narrowing. T9-10: Negative interspace. Bilateral facet hypertrophy. No significant stenosis. T10-11: Mild disc bulge with reactive endplate change. Bilateral facet hypertrophy. No spinal stenosis. Foramina remain patent. T11-12: Minimal disc bulge with bilateral facet hypertrophy. No spinal stenosis. Foramina remain grossly patent. T12-L1: Mild disc bulge with bilateral facet hypertrophy. No significant stenosis. IMPRESSION: 1. Technically limited exam due to extensive motion artifact. 2. No acute abnormality within the thoracic spine. No significant disc pathology or spinal stenosis. 3. Multilevel facet hypertrophy throughout the thoracic spine as detailed above. Findings could contribute to underlying back pain. Associated mild to moderate bilateral foraminal narrowing at T5-6 through T8-9. Electronically Signed   By: Jeannine Boga M.D.   On: 12/28/2020 20:20   MR LUMBAR SPINE WO CONTRAST  Result Date: 12/28/2020 CLINICAL DATA:  Initial evaluation for acute lower back pain, bilateral leg weakness for 2 days. EXAM: MRI LUMBAR SPINE WITHOUT CONTRAST TECHNIQUE: Multiplanar, multisequence MR imaging of the lumbar spine was performed. No intravenous contrast was administered. COMPARISON:  Prior CT from earlier the same day. FINDINGS: Segmentation: Examination moderately to severely degraded by motion artifact. Standard segmentation. Lowest well-formed disc space labeled the L5-S1 level. Alignment: Straightening with reversal of the normal cervical lordosis. Trace chronic retrolisthesis of L1 onto through L3  on L4, with additional trace retrolisthesis of T11 on T12 and T12 on L1. Vertebrae: Extensive susceptibility artifact from prior posterior fusion at L1 through the sacrum, with bilateral transpedicular screws in place at all levels. Interbody with anterior fusion in place at L4-5, with additional interbody fusion at L5-S1. Hardware better evaluated on prior CT. Chronic wedging deformity of the L3 vertebral body noted. Vertebral body height otherwise grossly maintained without definite acute fracture.  Bone marrow signal intensity diffusely heterogeneous. Benign hemangioma noted within the T12 vertebral body. No worrisome osseous lesions on this motion degraded exam. There is reactive marrow edema about the L5-S1 interspace, favored to be related to prior surgery and/or the hardware loosening as seen on prior CT. Possible infection/osteomyelitis difficult to exclude. Otherwise, no other convincing abnormal marrow edema. Conus medullaris and cauda equina: Conus extends to the T12-L1 level. Conus medullaris grossly within normal limits on this motion degraded exam. Nerve roots of the cauda equina grossly unremarkable, although evaluation limited by motion. Paraspinal and other soft tissues: Extensive postoperative changes present throughout the posterior paraspinous soft tissues. No visible collections. Disc levels: L1-2: Degenerative intervertebral disc space narrowing with diffuse disc bulge and disc desiccation. Prior posterior fusion. Residual facet hypertrophy. No spinal stenosis. Foramina appear patent. L2-3: Advanced degenerative intervertebral disc space narrowing with mild diffuse disc bulge and reactive endplate spurring. Prior posterior fusion and decompression. Moderate facet hypertrophy. Thecal sac remains patent. Residual moderate left L2 foraminal narrowing. Right neural foramen appears patent. L3-4: Advanced degenerative intervertebral disc space narrowing with partial ankylosis across the L3-4  interspace. Chronic disc bulge with reactive endplate spurring. Prior posterior fusion and decompression. Moderate right worse than left facet arthrosis. Residual mild to moderate left lateral recess stenosis. Central canal remains patent. Mild bilateral L3 foraminal narrowing. L4-5: Prior posterior and interbody fusion with posterior decompression. No residual spinal stenosis. Foramina appear grossly patent. L5-S1: Prior posterior and interbody fusion with posterior decompression. No residual spinal stenosis. Foramina appear grossly patent. IMPRESSION: 1. Limited exam due to extensive motion artifact. 2. Postoperative changes from prior posterior fusion with decompression at L1 through the sacrum. Residual mild to moderate left lateral recess narrowing at the L3-4 level. No other significant residual spinal stenosis. 3. Reactive marrow edema about the L5-S1 interspace, favored to be related to prior surgery and/or hardware loosening as seen on prior CT. Possible infection/osteomyelitis difficult to exclude, and could be considered in the correct clinical setting. Correlation with symptomatology and laboratory values recommended. No visible collections. No other convincing evidence for acute infection within the lumbar spine. 4. Residual mild-to-moderate left L2 and bilateral L3 foraminal stenosis as above. Electronically Signed   By: Jeannine Boga M.D.   On: 12/28/2020 20:01   DG Chest Port 1 View  Result Date: 12/28/2020 CLINICAL DATA:  Back pain EXAM: PORTABLE CHEST 1 VIEW COMPARISON:  12/28/2020 FINDINGS: Low lung volumes. Normal cardiac silhouette. No focal opacity, pleural effusion or pneumothorax. IMPRESSION: No active disease. Electronically Signed   By: Donavan Foil M.D.   On: 12/28/2020 21:08   DG Chest Port 1 View  Result Date: 12/28/2020 CLINICAL DATA:  Questionable sepsis. EXAM: PORTABLE CHEST 1 VIEW COMPARISON:  Chest x-ray 10/19/2020 FINDINGS: Slightly more prominent cardiomediastinal  silhouette likely due to low lung volumes and patient rotation. Prior hilar vasculature likely due to low lung volumes. Low lung volumes. No focal consolidation. No pulmonary edema. No pleural effusion. No pneumothorax. No acute osseous abnormality. IMPRESSION: 1. Slightly more prominent cardiomediastinal silhouette likely due to low lung volumes and patient rotation. Recommend repeat frontal view. 2. Otherwise no acute cardiopulmonary abnormality. Electronically Signed   By: Iven Finn M.D.   On: 12/28/2020 20:31    Procedures .Critical Care Performed by: Rayna Sexton, PA-C Authorized by: Rayna Sexton, PA-C   Critical care provider statement:    Critical care time (minutes):  60   Critical care was necessary to treat or prevent imminent or life-threatening deterioration of  the following conditions:  Sepsis and shock   Critical care was time spent personally by me on the following activities:  Discussions with consultants, evaluation of patient's response to treatment, examination of patient, ordering and performing treatments and interventions, ordering and review of laboratory studies, ordering and review of radiographic studies, pulse oximetry, re-evaluation of patient's condition, obtaining history from patient or surrogate and review of old charts   Medications Ordered in ED Medications  ceFEPIme (MAXIPIME) 2 g in sodium chloride 0.9 % 100 mL IVPB (2 g Intravenous Jeanbaptiste Bag/Given 12/28/20 2201)  vancomycin (VANCOREADY) IVPB 2000 mg/400 mL (2,000 mg Intravenous Mahr Bag/Given 12/28/20 2045)  norepinephrine (LEVOPHED) 27m in 256mpremix infusion (0 mcg/min Intravenous Stopped 12/28/20 2113)  oxyCODONE-acetaminophen (PERCOCET/ROXICET) 5-325 MG per tablet 1 tablet (1 tablet Oral Given 12/28/20 1606)  ondansetron (ZOFRAN-ODT) disintegrating tablet 8 mg (8 mg Oral Given 12/28/20 1606)  lactated ringers bolus 1,000 mL (0 mLs Intravenous Stopped 12/28/20 2159)  LORazepam (ATIVAN) tablet 0.5 mg  (0.5 mg Oral Given 12/28/20 1816)  lactated ringers bolus 1,000 mL (0 mLs Intravenous Stopped 12/28/20 1951)  metroNIDAZOLE (FLAGYL) IVPB 500 mg (0 mg Intravenous Stopped 12/28/20 2107)  lactated ringers bolus 1,000 mL (0 mLs Intravenous Stopped 12/28/20 2159)  midodrine (PROAMATINE) tablet 10 mg (10 mg Oral Given 12/28/20 2123)    ED Course  I have reviewed the triage vital signs and the nursing notes.  Pertinent labs & imaging results that were available during my care of the patient were reviewed by me and considered in my medical decision making (see chart for details).  Clinical Course as of 12/28/20 2203  Sat Dec 28, 2020  1708 Patient's became hypotensive after receiving pain medications.  She states that she feels fatigued but otherwise has no complaints.  Still reports mild to moderate low back pain.  We will give 1 L of IV fluids.  We will continue to closely monitor. [LJ]  172542atient reassessed.  We obtained a rectal temperature which is 101.2 F.  We will give an additional liter of IV fluids.  Sepsis lab work.  We will closely monitor. [LJ]  2040 Patient discussed with neurosurgery.  Agree with antibiotics and admission.  Do not feel that any further intervention is necessary at this time. [LJ]  2042 Patient discussed with the intensivist.  Recommends increasing patient's Levophed to 5 mcg/min.  Recommends giving a third L of IV fluids. [LJ]  2059 CRP(!): 29.0 [LJ]  2059 Sed Rate(!): 91 [LJ]  2103 Intensivist now request that we get patient in her home midodrine.  If her blood pressure improves recommends that we reduce the rate of the Levophed or discontinue the Levophed based on her blood pressure. [LJ]  2103 Nursing staff is notified me that patient's BP has improved to about 140/90 on Levophed.  Will reduce the rate and give patient her home dose of midodrine. [LJ]    Clinical Course User Index [LJ] JoRayna SextonPA-C   MDM Rules/Calculators/A&P                           Pt is a [age].o. female who presents to the emergency department due to low back pain.  Labs: CBC with a white count of 13.9, neutrophils of 12.2, absolute immature granulocytes of 0.14. CMP with a sodium of 133, glucose of 164, BUN of 24, creatinine 1.3, calcium of 8.3, total protein of 6.2, albumin of 2.3, alk phos of 137, total  bilirubin of 1.3, GFR 45. CRP of 29. Prothrombin time of 16.3 with an INR of 1.3. APTT of 34. Lactic acid 1.8. Respiratory panel is negative. ESR pending. Blood cultures obtained. Urine, UDS, and urine culture are pending.  Imaging: Chest x-ray, CT scan of the lumbar spine, and MRI of the thoracic and lumbar spine with findings as noted above.  I, Rayna Sexton, PA-C, personally reviewed and evaluated these images and lab results as part of my medical decision-making.  Patient complaining of only back pain upon arrival.  Denies any URI complaints or urinary complaints.  Found to have a leukocytosis.  Repeat temperature obtained rectally patient was found to be febrile.  Patient became hypotensive and was unresponsive to fluid resuscitation.  Patient started on Levophed.  MRI concerning for possible developing osteomyelitis.  Patient recently moved to [place] to be closer to family.  She states that she had surgery to her lumbar spine in [place] in March of this year.  Patient was discussed with Dr. Christella Noa with neurosurgery who agrees with antibiotics and admission.  Recommends consideration for possible biopsy if patient not improving with antibiotic therapy.  Does not feel that any further neurosurgical intervention is necessary at this time.  Patient will require admission for further management.  We will discuss with the intensivist.  Note: Portions of this report may have been transcribed using voice recognition software. Every effort was made to ensure accuracy; however, inadvertent computerized transcription errors may be present.   Final  Clinical Impression(s) / ED Diagnoses Final diagnoses:  Acute osteomyelitis of lumbar spine ([place])  Septic shock [facility])   Rx / DC Orders ED Discharge Orders     None        Rayna Sexton, PA-C 12/28/20 2205    Godfrey Pick, MD 12/30/20 [phone]

## 2020-12-28 NOTE — ED Notes (Signed)
Patient transported to MRI 

## 2020-12-28 NOTE — ED Notes (Signed)
Called report to [facility].

## 2020-12-28 NOTE — H&P (Signed)
NAME:  Tonya Baker MRN:  [id number] DOB:  [birth year] LOS: 0 ADMISSION DATE:  12/28/2020 DATE OF SERVICE:  12/28/2020  CHIEF COMPLAINT:  back pain, hypotension  HISTORY & PHYSICAL  History of Present Illness  This 70 y.o. RACE @ nursing home [facility] presented to the [facility] Emergency Department via EMS with complaints of back pain. She apparently recently moved to [facility] from [place].  She reports that she had back surgery in March 2022 that was complication with posoperative infection, requiring a 2nd and 3rd surgery in the weeks following the original surgery.  She developed Madl onset low back pain with pain radiating to the left hip about 2 days ago.  No trauma.  In the ER, the patient presented with a normal blood pressure.  She was given a Percocet, after which her blood pressure started trending downward.  As the patient presented with fever, the ER staff interpreted this to mean that the patient was progressing to septic shock, and the patient got 1 L IV fluid bolus and started a 2nd L, at which time norepinephrine infusion was also ordered by the ER provider.  PCCM asked to admit.  Lactate 1.1-->1.8.  Of note, the patient takes midodrine 10 mg PO TID, which was not administered in the ER.  After administration of the midodrine dose, the patient has maintained MAP > 65, albeit with labile SBP ranging from 78-108 with a narrow pulse pressure.  She is on multiple medications but is unable to tell me the indications for her medications.  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  She also states that she has been instructed to wear a CPAP mask for obstructive sleep apnea but discontinued use because she didn't think she needed it any more.  REVIEW OF SYSTEMS Constitutional: Fever.  Chills.  No weight loss. No night sweats. No fatigue. HEENT: Dry mouth.  No headaches, dysphagia, sore throat, otalgia, nasal congestion, PND CV:  No chest pain, orthopnea, PND, swelling in lower  extremities, palpitations GI:  No abdominal pain, nausea, vomiting, diarrhea, change in bowel pattern, anorexia Resp: No DOE, rest dyspnea, cough, mucus, hemoptysis, wheezing  GU: no dysuria, change in color of urine, no urgency or frequency.  No flank pain. MS:  Lower thoracic/high lumbar back pain with radiation to L hip. No joint pain or swelling. No myalgias,  No decreased range of motion.  Psych:  No change in mood or affect. No memory loss. Skin: no rash or lesions.   Past Medical/Surgical/Social/Family History   Past Medical History:  Diagnosis Date   Back pain    Obstructive sleep apnea    Type 2 diabetes mellitus with diabetic autonomic neuropathy, with long-term current use of insulin [facility])     Past Surgical History:  Procedure Laterality Date   BACK SURGERY N/A 06/2020    Social History   Tobacco Use   Smoking status: Not on file   Smokeless tobacco: Not on file  Substance Use Topics   Alcohol use: Not on file    No family history on file.   Procedures:     Significant Diagnostic Tests:     Micro Data:   Results for orders placed or performed during the hospital encounter of 12/28/20  Resp Panel by RT-PCR (Flu A&B, Covid) Nasopharyngeal Swab     Status: None   Collection Time: 12/28/20  6:08 PM   Specimen: Nasopharyngeal Swab; Nasopharyngeal(NP) swabs in vial transport medium  Result Value Ref Range Status  SARS Coronavirus 2 by RT PCR NEGATIVE NEGATIVE Final    Comment: (NOTE) SARS-CoV-2 target nucleic acids are NOT DETECTED.  The SARS-CoV-2 RNA is generally detectable in upper respiratory specimens during the acute phase of infection. The lowest concentration of SARS-CoV-2 viral copies this assay can detect is 138 copies/mL. A negative result does not preclude SARS-Cov-2 infection and should not be used as the sole basis for treatment or other patient management decisions. A negative result may occur with  improper specimen  collection/handling, submission of specimen other than nasopharyngeal swab, presence of viral mutation(s) within the areas targeted by this assay, and inadequate number of viral copies(<138 copies/mL). A negative result must be combined with clinical observations, patient history, and epidemiological information. The expected result is Negative.  Fact Sheet for Patients:  BloggerCourse.com  Fact Sheet for Healthcare Providers:  SeriousBroker.it  This test is no t yet approved or cleared by the [place] FDA and  has been authorized for detection and/or diagnosis of SARS-CoV-2 by FDA under an Emergency Use Authorization (EUA). This EUA will remain  in effect (meaning this test can be used) for the duration of the COVID-19 declaration under Section 564(b)(1) of the Act, 21 U.S.C.section 360bbb-3(b)(1), unless the authorization is terminated  or revoked sooner.       Influenza A by PCR NEGATIVE NEGATIVE Final   Influenza B by PCR NEGATIVE NEGATIVE Final    Comment: (NOTE) The Xpert Xpress SARS-CoV-2/FLU/RSV plus assay is intended as an aid in the diagnosis of influenza from Nasopharyngeal swab specimens and should not be used as a sole basis for treatment. Nasal washings and aspirates are unacceptable for Xpert Xpress SARS-CoV-2/FLU/RSV testing.  Fact Sheet for Patients: BloggerCourse.com  Fact Sheet for Healthcare Providers: SeriousBroker.it  This test is not yet approved or cleared by the [place] FDA and has been authorized for detection and/or diagnosis of SARS-CoV-2 by FDA under an Emergency Use Authorization (EUA). This EUA will remain in effect (meaning this test can be used) for the duration of the COVID-19 declaration under Section 564(b)(1) of the Act, 21 U.S.C. section 360bbb-3(b)(1), unless the authorization is terminated or revoked.  Performed at [facility], [address] [place], [place] [address]       Antimicrobials:  Cefepime/Flagyl/vancomycin (9/24>>)    Interim history/subjective:     Objective   BP (!) 85/50   Pulse 84   Temp (!) 101.2 F (38.4 C) (Rectal)   Resp 19   Ht  (1.626 m)   Wt 108.9 kg   SpO2 98%   BMI 41.20 kg/m     Filed Weights   12/28/20 1821  Weight: 108.9 kg   No intake or output data in the 24 hours ending 12/28/20 2053      Examination: GENERAL:  oriented to time, person and place, lethargic/drowsy, pleasant, morbidly obese. No acute distress. HEAD: normocephalic, atraumatic EYE: PERRLA, EOM intact, no scleral icterus, no pallor. NOSE: nares are patent. No polyps. No exudate.  THROAT/ORAL CAVITY: Normal dentition. No oral thrush. No exudate. Mucous membranes are moist. No tonsillar enlargement.  NECK: supple, no thyromegaly, no JVD, no lymphadenopathy. Trachea midline. CHEST/LUNG: symmetric in development and expansion. Distant breath sounds due to body habitus. Good air entry. Scant crackles. No wheezes. HEART: Regular S1 and S2 without murmur, rub or gallop. ABDOMEN: pendulous abdomen, large pannus, soft, nontender, nondistended. Normoactive bowel sounds. No rebound. No guarding. No hepatosplenomegaly. EXTREMITIES: Edema: Trace. No cyanosis. No clubbing. 2+ DP pulses  LYMPHATIC: no cervical/axillary/inguinal lymph nodes appreciated MUSCULOSKELETAL: Lower thoracic and high lumbar tenderness.  Mild swelling without obvious erythema or fluctuance. No bulk atrophy. Joints: normal inspection.  SKIN:  No rash or lesion. NEUROLOGIC: Doll's eyes intact. Corneal reflex intact. Spontaneous respirations intact. Cranial nerves II-XII are grossly symmetric and physiologic. Babinski absent. Peripheral neuropathy. Motor: 5/5 @ RUE, 5/5 @ LUE, 5/5 @ RLL,  5/5 @ LLL.  DTR: 2+ @ R biceps, 2+ @ L biceps, 2+ @ R patellar,  2+ @ L patellar. No cerebellar signs. Gait was not  assessed.   Resolved Hospital Problem list      Assessment & Plan:   ASSESSMENT/PLAN:  ASSESSMENT (included in the Hospital Problem List)  Active Problems:   Back pain   Hypotension   CKD (chronic kidney disease) stage 3, GFR 30-59 ml/min (HCC)   Spinal stenosis   By systems: PULMONARY Obstructive sleep apnea, nonadherent to prescribed CPAP therapy CPAP q HS PRN Titrate supplemental oxygen to maintain SpO2 93+%   CARDIOVASCULAR Hypotension Check BNP Continue IV fluids for now Continue midodrine 10 mg PO TID Furosemide 40 mg IV daily.  May need a dose tonight. Trend lactate   RENAL CKD stage 3a Place Foley catheter Monitor urine output Avoid nephrotoxic drugs Renal dosing of medications   GASTROINTESTINAL: No acute issues Continue Reglan GI PROPHYLAXIS: Protonix   HEMATOLOGIC Leukocytosis DVT PROPHYLAXIS: heparin   INFECTIOUS Sepsis (fever, hypotension, leukocytosis) Possible osteomyelitis (lumbosacral spine) Tylenol for fever Continue IV fluids Empiric antibiotics (cefepime/Falgy/vancomycin) Blood cultures Check U/A   ENDOCRINE Type 2 diabetes Sliding scale insulin   NEUROLOGIC Chronic back pain/spinal stenosis Peripheral neuropathy Lethargy due to administration of narcotic analgesic She is noted to be on MSIR 15 mg PO q 8 hr PRN, Roxicodone 15 mg PO q 8 hr PRN   PLAN/RECOMMENDATIONS  Admit to ICU under my service (Attending: Marcelle Smiling, MD) with the diagnoses highlighted above in the active Hospital Problem List (ASSESSMENT). See above NUTRITION: diabetic    My assessment, plan of care, findings, medications, side effects, etc. were discussed with: nurse.   Best practice:  Diet: diabetic Pain/Anxiety/Delirium protocol (if indicated): N/A VAP protocol (if indicated): N/A DVT prophylaxis: heparin GI prophylaxis: Protonix Glucose control: sliding scale insuline Mobility/Activity: bedrest   Code Status: Full Code Family  Communication:   no family at bedside Disposition: admit to ICU   Labs   CBC: Recent Labs  Lab 12/28/20 1613  WBC 13.9*  NEUTROABS 12.2*  HGB 12.6  HCT 39.2  MCV 93.1  PLT 155    Basic Metabolic Panel: Recent Labs  Lab 12/28/20 1707  NA 133*  K 3.5  CL 101  CO2 22  GLUCOSE 164*  BUN 24*  CREATININE 1.30*  CALCIUM 8.3*   GFR: Estimated Creatinine Clearance: 49.3 mL/min (A) (by C-G formula based on SCr of 1.3 mg/dL (H)). Recent Labs  Lab 12/28/20 1613 12/28/20 1803  WBC 13.9*  --   LATICACIDVEN  --  1.8    Liver Function Tests: Recent Labs  Lab 12/28/20 1707  AST 19  ALT 14  ALKPHOS 137*  BILITOT 1.3*  PROT 6.2*  ALBUMIN 2.3*   No results for input(s): LIPASE, AMYLASE in the last 168 hours. No results for input(s): AMMONIA in the last 168 hours.  ABG    Component Value Date/Time   HCO3 29.4 (H) 09/29/2020 1705   O2SAT 53.1 09/29/2020 1705     Coagulation Profile: Recent Labs  Lab 12/28/20 1803  INR 1.3*  Cardiac Enzymes: No results for input(s): CKTOTAL, CKMB, CKMBINDEX, TROPONINI in the last 168 hours.  HbA1C: No results found for: HGBA1C  CBG: No results for input(s): GLUCAP in the last 168 hours.   Past Medical History  No past medical history on file.    Surgical History   No past surgical history on file.    Social History   Social History   Socioeconomic History   Marital status: Single    Spouse name: Not on file   Number of children: Not on file   Years of education: Not on file   Highest education level: Not on file  Occupational History   Not on file  Tobacco Use   Smoking status: Not on file   Smokeless tobacco: Not on file  Substance and Sexual Activity   Alcohol use: Not on file   Drug use: Not on file   Sexual activity: Not on file  Other Topics Concern   Not on file  Social History Narrative   Not on file   Social Determinants of Health   Financial Resource Strain: Not on file  Food  Insecurity: Not on file  Transportation Needs: Not on file  Physical Activity: Not on file  Stress: Not on file  Social Connections: Not on file      Family History   No family history on file. family history is not on file.    Allergies Allergies  Allergen Reactions   Cortisone Other (See Comments)    Unknown reaction   Hydrocortisone Other (See Comments)    Unknown reaction   Latex Other (See Comments)    Unknown reaction      Current Medications  Current Facility-Administered Medications:    ceFEPIme (MAXIPIME) 2 g in sodium chloride 0.9 % 100 mL IVPB, 2 g, Intravenous, Once, Joldersma, Logan, PA-C   lactated ringers bolus 1,000 mL, 1,000 mL, Intravenous, Once, Joldersma, Logan, PA-C   metroNIDAZOLE (FLAGYL) IVPB 500 mg, 500 mg, Intravenous, Once, Joldersma, Logan, PA-C, Last Rate: 100 mL/hr at 12/28/20 2005, 500 mg at 12/28/20 2005   norepinephrine (LEVOPHED) 4mg  in premix infusion, 0-40 mcg/min, Intravenous, Continuous, Joldersma, Logan, PA-C, Last Rate: 11.25 mL/hr at 12/28/20 2031, 3 mcg/min at 12/28/20 2031   vancomycin (VANCOREADY) IVPB 2000 mg/400 mL, 2,000 mg, Intravenous, Once, 2032, MD, Last Rate: 200 mL/hr at 12/28/20 2045, 2,000 mg at 12/28/20 2045  Current Outpatient Medications:    acetaminophen (TYLENOL) 325 MG tablet, Take 650 mg by mouth every 6 (six) hours as needed (pain)., Disp: , Rfl:    albuterol (PROVENTIL) (2.5 MG/3ML) 0.083% nebulizer solution, Take 2.5 mg by nebulization every 8 (eight) hours as needed for wheezing or shortness of breath (congestion)., Disp: , Rfl:    alum & mag hydroxide-simeth (MAALOX PLUS) 400-400-40 MG/5ML suspension, Take 30 mLs by mouth every 6 (six) hours as needed for indigestion., Disp: , Rfl:    ascorbic acid (VITAMIN C) 500 MG tablet, Take 500 mg by mouth every morning., Disp: , Rfl:    aspirin 81 MG chewable tablet, Chew 81 mg by mouth every morning., Disp: , Rfl:    atorvastatin (LIPITOR) 40 MG tablet, Take  40 mg by mouth at bedtime., Disp: , Rfl:    bisacodyl (DULCOLAX) 10 MG suppository, Place 10 mg rectally daily as needed for mild constipation (constipation)., Disp: , Rfl:    camphor-menthol (SARNA) lotion, Apply 1 application topically every 12 (twelve) hours., Disp: , Rfl:    cholecalciferol (VITAMIN D3) 25  MCG (1000 UNIT) tablet, Take 1,000 Units by mouth every morning., Disp: , Rfl:    ciprofloxacin (CIPRO) 500 MG tablet, Take 1 tablet (500 mg total) by mouth 2 (two) times daily., Disp: 28 tablet, Rfl: 0   cyclobenzaprine (FLEXERIL) 10 MG tablet, Take 10 mg by mouth 3 (three) times daily., Disp: , Rfl:    docusate sodium (COLACE) 100 MG capsule, Take 100 mg by mouth every morning., Disp: , Rfl:    doxycycline (VIBRAMYCIN) 100 MG capsule, Take 100 mg by mouth 2 (two) times daily. Stat date: 10/03/20, Disp: , Rfl:    Dulaglutide (TRULICITY) 0.75 MG/0.5ML SOPN, Inject 0.75 mg into the skin every Monday., Disp: , Rfl:    famotidine (PEPCID) 20 MG tablet, Take 20 mg by mouth every 12 (twelve) hours. For rash/itching, Disp: , Rfl:    ferrous sulfate 325 (65 FE) MG tablet, Take 325 mg by mouth every morning., Disp: , Rfl:    fluocinonide cream (LIDEX) 0.05 %, Apply 1 application topically See admin instructions. Apply topically every day and night shift for dry skin, may also apply every 12 hours as needed for severe xerosis, Disp: , Rfl:    furosemide (LASIX) 40 MG tablet, Take 40 mg by mouth every morning., Disp: , Rfl:    gabapentin (NEURONTIN) 300 MG capsule, Take 600 mg by mouth 3 (three) times daily., Disp: , Rfl:    hydrOXYzine (ATARAX/VISTARIL) 25 MG tablet, Take 25 mg by mouth at bedtime., Disp: , Rfl:    insulin aspart (NOVOLOG FLEXPEN) 100 UNIT/ML FlexPen, Inject 0-10 Units into the skin See admin instructions. CBG 70-150 0 unit, 151-200 2 units, 201-250 4 units, 251-300 6 units, 301-350 8 units, 351-400 10 units, >400 give 12 units and call MD subcutaneously three times daily., Disp: , Rfl:     Insulin Glargine-yfgn 100 UNIT/ML SOPN, Inject 60 Units into the skin daily., Disp: , Rfl:    magnesium citrate SOLN, Take 30 mLs by mouth every 12 (twelve) hours as needed for mild constipation (constipation)., Disp: , Rfl:    metoCLOPramide (REGLAN) 5 MG tablet, Take 5 mg by mouth See admin instructions. Take 1 tablet by mouth before meals and at bedtime, Disp: , Rfl:    midodrine (PROAMATINE) 10 MG tablet, Take 10 mg by mouth 3 (three) times daily., Disp: , Rfl:    morphine (MSIR) 15 MG tablet, Take 15 mg by mouth every 8 (eight) hours. scheduled, Disp: , Rfl:    Multiple Vitamin (MULTIVITAMIN WITH MINERALS) TABS tablet, Take 1 tablet by mouth every morning., Disp: , Rfl:    ondansetron (ZOFRAN) 4 MG tablet, Take 4 mg by mouth every 4 (four) hours as needed for nausea or vomiting., Disp: , Rfl:    oxyCODONE (ROXICODONE) 15 MG immediate release tablet, Take 15 mg by mouth every 8 (eight) hours as needed for pain., Disp: , Rfl:    pantoprazole (PROTONIX) 40 MG tablet, Take 40 mg by mouth 2 (two) times daily., Disp: , Rfl:    polyethylene glycol (MIRALAX / GLYCOLAX) 17 g packet, Take 17 g by mouth every morning., Disp: , Rfl:    potassium chloride SA (KLOR-CON) 20 MEQ tablet, Take 20 mEq by mouth every morning., Disp: , Rfl:    sertraline (ZOLOFT) 25 MG tablet, Take 12.5 mg by mouth daily., Disp: , Rfl:    Skin Protectants, Misc. (EUCERIN) cream, Apply 1 application topically daily., Disp: , Rfl:    temazepam (RESTORIL) 30 MG capsule, Take 30 mg by mouth  at bedtime., Disp: , Rfl:    Home Medications  Prior to Admission medications   Medication Sig Start Date End Date Taking? Authorizing Provider  acetaminophen (TYLENOL) 325 MG tablet Take 650 mg by mouth every 6 (six) hours as needed (pain).    [provider]  albuterol (PROVENTIL) (2.5 MG/3ML) 0.083% nebulizer solution Take 2.5 mg by nebulization every 8 (eight) hours as needed for wheezing or shortness of breath (congestion).     [provider]  alum & mag hydroxide-simeth (MAALOX PLUS) 400-400-40 MG/5ML suspension Take 30 mLs by mouth every 6 (six) hours as needed for indigestion.    [provider]  ascorbic acid (VITAMIN C) 500 MG tablet Take 500 mg by mouth every morning.    [provider]  aspirin 81 MG chewable tablet Chew 81 mg by mouth every morning.    [provider]  atorvastatin (LIPITOR) 40 MG tablet Take 40 mg by mouth at bedtime.    [provider]  bisacodyl (DULCOLAX) 10 MG suppository Place 10 mg rectally daily as needed for mild constipation (constipation).    [provider]  camphor-menthol Wynelle Fanny) lotion Apply 1 application topically every 12 (twelve) hours.    [provider]  cholecalciferol (VITAMIN D3) 25 MCG (1000 UNIT) tablet Take 1,000 Units by mouth every morning.    [provider]  ciprofloxacin (CIPRO) 500 MG tablet Take 1 tablet (500 mg total) by mouth 2 (two) times daily. 10/19/20   Margarita Grizzle, MD  cyclobenzaprine (FLEXERIL) 10 MG tablet Take 10 mg by mouth 3 (three) times daily.    [provider]  docusate sodium (COLACE) 100 MG capsule Take 100 mg by mouth every morning.    [provider]  doxycycline (VIBRAMYCIN) 100 MG capsule Take 100 mg by mouth 2 (two) times daily. Stat date: 10/03/20    [provider]  Dulaglutide (TRULICITY) 0.75 MG/0.5ML SOPN Inject 0.75 mg into the skin every Monday.    [provider]  famotidine (PEPCID) 20 MG tablet Take 20 mg by mouth every 12 (twelve) hours. For rash/itching    [provider]  ferrous sulfate 325 (65 FE) MG tablet Take 325 mg by mouth every morning.    [provider]  fluocinonide cream (LIDEX) 0.05 % Apply 1 application topically See admin instructions. Apply topically every day and night shift for dry skin, may also apply every 12 hours as needed for severe xerosis    [provider]  furosemide  (LASIX) 40 MG tablet Take 40 mg by mouth every morning.    [provider]  gabapentin (NEURONTIN) 300 MG capsule Take 600 mg by mouth 3 (three) times daily.    [provider]  hydrOXYzine (ATARAX/VISTARIL) 25 MG tablet Take 25 mg by mouth at bedtime.    [provider]  insulin aspart (NOVOLOG FLEXPEN) 100 UNIT/ML FlexPen Inject 0-10 Units into the skin See admin instructions. CBG 70-150 0 unit, 151-200 2 units, 201-250 4 units, 251-300 6 units, 301-350 8 units, 351-400 10 units, >400 give 12 units and call MD subcutaneously three times daily.    [provider]  Insulin Glargine-yfgn 100 UNIT/ML SOPN Inject 60 Units into the skin daily.    [provider]  magnesium citrate SOLN Take 30 mLs by mouth every 12 (twelve) hours as needed for mild constipation (constipation).    [provider]  metoCLOPramide (REGLAN) 5 MG tablet Take 5 mg by mouth See admin instructions. Take 1  tablet by mouth before meals and at bedtime    [provider]  midodrine (PROAMATINE) 10 MG tablet Take 10 mg by mouth 3 (three) times daily.    [provider]  morphine (MSIR) 15 MG tablet Take 15 mg by mouth every 8 (eight) hours. scheduled    [provider]  Multiple Vitamin (MULTIVITAMIN WITH MINERALS) TABS tablet Take 1 tablet by mouth every morning.    [provider]  ondansetron (ZOFRAN) 4 MG tablet Take 4 mg by mouth every 4 (four) hours as needed for nausea or vomiting.    [provider]  oxyCODONE (ROXICODONE) 15 MG immediate release tablet Take 15 mg by mouth every 8 (eight) hours as needed for pain.    [provider]  pantoprazole (PROTONIX) 40 MG tablet Take 40 mg by mouth 2 (two) times daily.    [provider]  polyethylene glycol (MIRALAX / GLYCOLAX) 17 g packet Take 17 g by mouth every morning.    [provider]  potassium chloride SA (KLOR-CON) 20 MEQ tablet Take 20 mEq by mouth  every morning.    [provider]  sertraline (ZOLOFT) 25 MG tablet Take 12.5 mg by mouth daily.    [provider]  Skin Protectants, Misc. (EUCERIN) cream Apply 1 application topically daily.    [provider]  temazepam (RESTORIL) 30 MG capsule Take 30 mg by mouth at bedtime.    [provider]      Critical care time: 60 minutes.  The treatment and management of the patient's condition was required based on the threat of imminent deterioration. This time reflects time spent by the physician evaluating, providing care and managing the critically ill patient's care. The time was spent at the immediate bedside (or on the same floor/unit and dedicated to this patient's care). Time involved in separately billable procedures is NOT included int he critical care time indicated above. Family meeting and update time may be included above if and only if the patient is unable/incompetent to participate in clinical interview and/or decision making, and the discussion was necessary to determining treatment decisions.   Marcelle Smiling, MD Board Certified by the ABIM, Pulmonary Diseases & Critical Care Medicine

## 2020-12-29 ENCOUNTER — Inpatient Hospital Stay (HOSPITAL_COMMUNITY): Payer: Medicare Other

## 2020-12-29 DIAGNOSIS — R579 Shock, unspecified: Secondary | ICD-10-CM

## 2020-12-29 DIAGNOSIS — A419 Sepsis, unspecified organism: Secondary | ICD-10-CM | POA: Diagnosis not present

## 2020-12-29 DIAGNOSIS — R0602 Shortness of breath: Secondary | ICD-10-CM | POA: Diagnosis not present

## 2020-12-29 DIAGNOSIS — M4620 Osteomyelitis of vertebra, site unspecified: Secondary | ICD-10-CM

## 2020-12-29 DIAGNOSIS — T847XXA Infection and inflammatory reaction due to other internal orthopedic prosthetic devices, implants and grafts, initial encounter: Secondary | ICD-10-CM

## 2020-12-29 DIAGNOSIS — E8770 Fluid overload, unspecified: Secondary | ICD-10-CM | POA: Diagnosis present

## 2020-12-29 DIAGNOSIS — I9589 Other hypotension: Secondary | ICD-10-CM | POA: Diagnosis not present

## 2020-12-29 LAB — CBC
HCT: 38.2 % (ref 36.0–46.0)
Hemoglobin: 12.5 g/dL (ref 12.0–15.0)
MCH: 30.1 pg (ref 26.0–34.0)
MCHC: 32.7 g/dL (ref 30.0–36.0)
MCV: 92 fL (ref 80.0–100.0)
Platelets: 121 10*3/uL — ABNORMAL LOW (ref 150–400)
RBC: 4.15 MIL/uL (ref 3.87–5.11)
RDW: 13.2 % (ref 11.5–15.5)
WBC: 14.9 10*3/uL — ABNORMAL HIGH (ref 4.0–10.5)
nRBC: 0 % (ref 0.0–0.2)

## 2020-12-29 LAB — BLOOD CULTURE ID PANEL (REFLEXED) - BCID2

## 2020-12-29 LAB — BLOOD GAS, VENOUS
Acid-base deficit: 1.5 mmol/L (ref 0.0–2.0)
Bicarbonate: 22.2 mmol/L (ref 20.0–28.0)
FIO2: 21
O2 Saturation: 83.9 %
Patient temperature: 37
pCO2, Ven: 34.1 mmHg — ABNORMAL LOW (ref 44.0–60.0)
pH, Ven: 7.43 (ref 7.250–7.430)
pO2, Ven: 49.7 mmHg — ABNORMAL HIGH (ref 32.0–45.0)

## 2020-12-29 LAB — RAPID URINE DRUG SCREEN, HOSP PERFORMED
Amphetamines: NOT DETECTED
Barbiturates: NOT DETECTED
Benzodiazepines: NOT DETECTED
Cocaine: NOT DETECTED
Opiates: NOT DETECTED
Tetrahydrocannabinol: NOT DETECTED

## 2020-12-29 LAB — URINALYSIS, COMPLETE (UACMP) WITH MICROSCOPIC
Bacteria, UA: NONE SEEN
Bilirubin Urine: NEGATIVE
Glucose, UA: NEGATIVE mg/dL
Ketones, ur: NEGATIVE mg/dL
Nitrite: POSITIVE — AB
Protein, ur: 30 mg/dL — AB
Specific Gravity, Urine: 1.014 (ref 1.005–1.030)
pH: 6 (ref 5.0–8.0)

## 2020-12-29 LAB — CREATININE, SERUM
Creatinine, Ser: 1 mg/dL (ref 0.44–1.00)
GFR, Estimated: >60 mL/min (ref >60)

## 2020-12-29 LAB — BASIC METABOLIC PANEL
Anion gap: 14 (ref 5–15)
BUN: 20 mg/dL (ref 8–23)
CO2: 21 mmol/L — ABNORMAL LOW (ref 22–32)
Calcium: 8.6 mg/dL — ABNORMAL LOW (ref 8.9–10.3)
Chloride: 100 mmol/L (ref 98–111)
Creatinine, Ser: 1.28 mg/dL — ABNORMAL HIGH (ref 0.44–1.00)
GFR, Estimated: 45 mL/min — ABNORMAL LOW (ref >60)
Glucose, Bld: 94 mg/dL (ref 70–99)
Potassium: 4.1 mmol/L (ref 3.5–5.1)
Sodium: 135 mmol/L (ref 135–145)

## 2020-12-29 LAB — PROCALCITONIN: Procalcitonin: 0.9 ng/mL

## 2020-12-29 LAB — ECHOCARDIOGRAM COMPLETE
Area-P 1/2: 3.85 cm2
Calc EF: 64.1 %
Height: 64 in
S' Lateral: 3.5 cm
Single Plane A2C EF: 69.4 %
Single Plane A4C EF: 54.8 %
Weight: 3312.19 oz

## 2020-12-29 LAB — HEMOGLOBIN A1C
Hgb A1c MFr Bld: 7.8 % — ABNORMAL HIGH (ref 4.8–5.6)
Mean Plasma Glucose: 177.16 mg/dL

## 2020-12-29 LAB — MAGNESIUM: Magnesium: 1.5 mg/dL — ABNORMAL LOW (ref 1.7–2.4)

## 2020-12-29 LAB — GLUCOSE, CAPILLARY
Glucose-Capillary: 117 mg/dL — ABNORMAL HIGH (ref 70–99)
Glucose-Capillary: 150 mg/dL — ABNORMAL HIGH (ref 70–99)
Glucose-Capillary: 185 mg/dL — ABNORMAL HIGH (ref 70–99)
Glucose-Capillary: 192 mg/dL — ABNORMAL HIGH (ref 70–99)
Glucose-Capillary: 206 mg/dL — ABNORMAL HIGH (ref 70–99)
Glucose-Capillary: 227 mg/dL — ABNORMAL HIGH (ref 70–99)
Glucose-Capillary: 42 mg/dL — CL (ref 70–99)
Glucose-Capillary: 71 mg/dL (ref 70–99)

## 2020-12-29 LAB — LACTIC ACID, PLASMA
Lactic Acid, Venous: 0.9 mmol/L (ref 0.5–1.9)
Lactic Acid, Venous: 1.2 mmol/L (ref 0.5–1.9)
Lactic Acid, Venous: 1.2 mmol/L (ref 0.5–1.9)
Lactic Acid, Venous: 2.3 mmol/L (ref 0.5–1.9)
Lactic Acid, Venous: 2.6 mmol/L (ref 0.5–1.9)

## 2020-12-29 LAB — CORTISOL: Cortisol, Plasma: 22.6 ug/dL

## 2020-12-29 LAB — BRAIN NATRIURETIC PEPTIDE: B Natriuretic Peptide: 577.5 pg/mL — ABNORMAL HIGH (ref 0.0–100.0)

## 2020-12-29 LAB — HIV ANTIBODY (ROUTINE TESTING W REFLEX): HIV Screen 4th Generation wRfx: NONREACTIVE

## 2020-12-29 LAB — MRSA NEXT GEN BY PCR, NASAL: MRSA by PCR Next Gen: NOT DETECTED

## 2020-12-29 LAB — TROPONIN I (HIGH SENSITIVITY): Troponin I (High Sensitivity): 19 ng/L — ABNORMAL HIGH (ref <18)

## 2020-12-29 MED ORDER — GABAPENTIN 300 MG PO CAPS: 600.0000 mg | ORAL_CAPSULE | Freq: Three times a day (TID) | ORAL | Status: DC

## 2020-12-29 MED ORDER — MAGNESIUM SULFATE 4 GM/100ML IV SOLN
4.0000 g | Freq: Once | INTRAVENOUS | Status: AC
  Administered 2020-12-29: 4 g via INTRAVENOUS
  Filled 2020-12-29: qty 100

## 2020-12-29 MED ORDER — SODIUM CHLORIDE 0.9 % IV SOLN: 2.0000 g | Freq: Two times a day (BID) | INTRAVENOUS | Status: DC

## 2020-12-29 MED ORDER — GABAPENTIN 100 MG PO CAPS: 200.0000 mg | ORAL_CAPSULE | Freq: Three times a day (TID) | ORAL | Status: DC

## 2020-12-29 MED ORDER — PERFLUTREN LIPID MICROSPHERE
1.0000 mL | INTRAVENOUS | Status: AC | PRN
  Administered 2020-12-29: 2 mL via INTRAVENOUS
  Filled 2020-12-29: qty 10

## 2020-12-29 MED ORDER — CHLORHEXIDINE GLUCONATE CLOTH 2 % EX PADS
6.0000 | MEDICATED_PAD | Freq: Every day | CUTANEOUS | Status: DC
  Administered 2020-12-29 – 2021-01-03 (×5): 6 via TOPICAL

## 2020-12-29 MED ORDER — MORPHINE SULFATE (PF) 2 MG/ML IV SOLN: 4.0000 mg | INTRAVENOUS | Status: DC | PRN

## 2020-12-29 MED ORDER — CYCLOBENZAPRINE HCL 10 MG PO TABS: 10.0000 mg | ORAL_TABLET | Freq: Three times a day (TID) | ORAL | Status: DC

## 2020-12-29 MED ORDER — MIDODRINE HCL 5 MG PO TABS
15.0000 mg | ORAL_TABLET | Freq: Three times a day (TID) | ORAL | Status: DC
  Administered 2020-12-29 – 2020-12-30 (×4): 15 mg via ORAL
  Filled 2020-12-29 (×4): qty 3

## 2020-12-29 MED ORDER — FUROSEMIDE 10 MG/ML IJ SOLN
40.0000 mg | Freq: Once | INTRAMUSCULAR | Status: AC
  Administered 2020-12-29: 40 mg via INTRAVENOUS
  Filled 2020-12-29: qty 4

## 2020-12-29 MED ORDER — DM-GUAIFENESIN ER 30-600 MG PO TB12
1.0000 | ORAL_TABLET | Freq: Two times a day (BID) | ORAL | Status: DC | PRN
  Administered 2020-12-29: 1 via ORAL
  Filled 2020-12-29 (×2): qty 1

## 2020-12-29 MED ORDER — SODIUM CHLORIDE 0.9 % IV SOLN: INTRAVENOUS | Status: DC | PRN

## 2020-12-29 MED ORDER — CYCLOBENZAPRINE HCL 10 MG PO TABS
5.0000 mg | ORAL_TABLET | Freq: Three times a day (TID) | ORAL | Status: DC | PRN
  Administered 2020-12-29 – 2021-01-03 (×10): 5 mg via ORAL
  Filled 2020-12-29 (×11): qty 1

## 2020-12-29 MED ORDER — GABAPENTIN 100 MG PO CAPS
100.0000 mg | ORAL_CAPSULE | Freq: Three times a day (TID) | ORAL | Status: DC
  Administered 2020-12-29 – 2021-01-01 (×10): 100 mg via ORAL
  Filled 2020-12-29 (×10): qty 1

## 2020-12-29 MED ORDER — SODIUM CHLORIDE 0.9 % IV SOLN
2.0000 g | INTRAVENOUS | Status: DC
  Administered 2020-12-29 – 2020-12-30 (×2): 2 g via INTRAVENOUS
  Filled 2020-12-29 (×2): qty 20

## 2020-12-29 MED ORDER — SODIUM CHLORIDE 0.9 % IV SOLN
2.0000 g | Freq: Three times a day (TID) | INTRAVENOUS | Status: DC
  Administered 2020-12-29: 2 g via INTRAVENOUS
  Filled 2020-12-29: qty 2

## 2020-12-29 MED ORDER — PANTOPRAZOLE SODIUM 40 MG PO TBEC
40.0000 mg | DELAYED_RELEASE_TABLET | Freq: Every day | ORAL | Status: DC
  Administered 2020-12-29 – 2021-01-03 (×6): 40 mg via ORAL
  Filled 2020-12-29 (×6): qty 1

## 2020-12-29 NOTE — Progress Notes (Addendum)
eLink Physician-Brief Progress Note Patient Name: Tonya Baker DOB: December 30, 1950 MRN: [id number]   Date of Service  12/29/2020  HPI/Events of Note  Brief HPI: 70 yr old female with hx CKD, Spinal stenosis, OSA non adherent to her CPAP, DM type 2, hx of back surgery in March 2022 that was complication with posoperative infection, requiring a 2nd and 3rd surgery in the weeks following the original surgery. Presented to [facility] ED with thoracic pain. MRI neg for acute findings like abscess or #.  Admitted to [facility] ICU for Septic shock, on levophed post 2 lit fluids.  At home on midodrine.  Notes, labs reviewed.   Data: BNP 577, Cr 1 down from 1.3, LFT normal.  WBC 16 K, plt 127 Pro cal and LA normal. Hg 10.6  CxR : possible perihilar congestion- chf Left basal atelectasis vs body habitus-obesity shadowing.  MRI thoracic spine: no acute findings. Reactive marrow edema about the L5-S1 interspace, favored to be related to prior surgery and/or hardware loosening as seen on prior CT. Possible infection/osteomyelitis difficult to exclude, and could be considered in the correct clinical setting. Correlation with symptomatology and laboratory values recommended. No visible collections. No other convincing evidence for acute infection within the lumbar spine.MRI lumbar:  10/19/2020: Urine ESBL e coli. Covid/flu neg  Camera: Resting. Obese. VS stable. Febrile.  HR 90, sats 99%. MAP > 70.  A: Severe sepsis, hypotensive-s/p 2 lit fluids/vanc-cefepime.  Levophed gtt to keep MAP > 65 from [facility] ED. Source not clear could be lumbar osteomyelitis?  Recent hx of ESBL UTI ecoli. procal normal.          LA improved. ? Autonomic dysfunction from spinal stenosis.   2.  DM type 2. 3. Elevated BNP. CxR per hilar congestion.  EKG: Sinus, qtc 447. T down in V1,2. Non specific changes.    eICU Interventions  - get routine UA reflux culture- pending. Follow drug screening.  - Neuro surgery consultation. - SSI -CBG  goals < 180 - SQ heparin for VTE prophylaxis. Monitor for worsening thrombocytopenia - if blood cx + for staph or worsening leukocytosis- to consider ID /consultation - CPAP at night - midodrine as at home. - troponin once.   Discussed with Dr Ardeth Perfect. Will wait for UA reports. For now continue vanc/cef/flagyl.       Intervention Category Major Interventions: Hypotension - evaluation and management;Sepsis - evaluation and management Evaluation Type: [facility] Patient Evaluation  Ranee Gosselin 12/29/2020, 12:24 AM

## 2020-12-29 NOTE — Progress Notes (Signed)
  Echocardiogram 2D Echocardiogram has been performed.  Tonya Baker 12/29/2020, 11:01 AM

## 2020-12-29 NOTE — Consult Note (Addendum)
[facility]    Date of Admission:  12/28/2020     Reason for Consult: sepsis    Referring Provider: Ardeth Perfect     Lines:  Peripheral iv's  Abx: 9/26-c ceftriaxone 9/24-c vanc  9/24-25 cefepime        Assessment: Sepsis Hx spinal hardware and associated infection   70 yo female nursing home resident, hx spinal hardware infection, supposedly on suppressive abx, admitted in transfer from [facility] ed 9/24 for acute few days of lower back pain found to have septic shock and 1 of 4 bottles strep species   Her symptomatology on presentation is focused on lower back pain She is on home midodrine and received sedation/pain meds prompting low dose levophed use, which has much improved within 24 hours presentation  She does have fever and again back pain and mri suggestion of some hardware loosening which could be chronic.   No evidence of other localizing infectious syndrome  Unclear what to make of the low burden strep species bacteremia. Will await full speciation and repeat bcx, but suspect to be negative given her quick improvement. Tte no obvious endocarditis. Her handoc score is also rather low. Overall low suspicion IE at this time   Nursing staff asked about the rash on her left lower abd which she reports to have been there for a long time and appeared lichenification vs chronic dermatitis rather than cellulitis   Plan: Repeat bcx Primary team had asked (9/25) for prior record where she has back surgery/infection tx at [facility]. We can see what bug she had previously and tx hx As sx of this admission focused on the back, If bcx negative on repeat, suspect we can follow her outpatient/trend crp and initially increased dose of appropriate abx suppression Please discuss with nsg about mri finding of hardware loosening Keep vanc/ceftriaxone for now pending outside record and final blood cx result Discussed with primary team  I  spent 60 minute reviewing data/chart, and coordinating care and >50% direct face to face time providing counseling/discussing diagnostics/treatment plan with patient      ------------------------------------------------ Principal Problem:   Hypotension Active Problems:   Back pain   CKD (chronic kidney disease) stage 3, GFR 30-59 ml/min (HCC)   Spinal stenosis   Obstructive sleep apnea   Sepsis (HCC)   Type 2 diabetes mellitus with diabetic neuropathy, with long-term current use of insulin (HCC)   Volume overload    HPI: Tonya Baker is a 70 y.o. 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 No abscess  She is doing much better today Abx vanc/cefepime --> vanc ceftriaxone  She doesn't recall which abx she has been on and doesn't recall much of the back surgery/tx course  She is taking midodrine chronically per primary team Record has been requested from [place] today   She denies headache, cough, sorethroat, chest pain, n/v/diarrhea Has chronic left abd red rash  No other joint pain  Fam hx: No immunosuppression    Allergies  Allergen Reactions   Cortisone Other (See Comments)    Unknown reaction   Hydrocortisone Other (See Comments)    Unknown reaction   Latex Other (See Comments)    Unknown reaction    Review of Systems: ROS All Other ROS was negative, except mentioned above   Past Medical History:  Diagnosis Date   Back pain    Obstructive  sleep apnea    Type 2 diabetes mellitus with diabetic autonomic neuropathy, with long-term current use of insulin (HCC)        Scheduled Meds:  aspirin  81 mg Oral q morning   atorvastatin  40 mg Oral QHS   Chlorhexidine Gluconate Cloth  6 each Topical Daily   gabapentin  100 mg Oral TID   heparin  5,000 Units Subcutaneous Q8H   hydrocerin  1 application Topical Daily   insulin aspart  0-20 Units Subcutaneous Q4H   midodrine  15 mg Oral TID with meals   multivitamin with minerals  1 tablet Oral q morning   pantoprazole  40 mg Oral QHS   sertraline  12.5 mg Oral Daily   Continuous Infusions:  sodium chloride 10 mL/hr at 12/29/20 0900   ceFEPime (MAXIPIME) IV     norepinephrine (LEVOPHED) Adult infusion 2 mcg/min (12/29/20 0900)   [START ON 12/30/2020] vancomycin     PRN Meds:.sodium chloride, acetaminophen, albuterol, cyclobenzaprine, docusate sodium, morphine injection, ondansetron (ZOFRAN) IV, polyethylene glycol   OBJECTIVE: Blood pressure (!) 76/45, pulse 80, temperature 98.8 F (37.1 C), temperature source Oral, resp. rate (!) 21, height 5\' 4"  (1.626 m), weight 93.9 kg, SpO2 94 %.  Physical Exam General/constitutional: no distress, pleasant, fully conversant; red/pink hair dye HEENT: Normocephalic, PER, Conj Clear, EOMI, Oropharynx clear Neck supple CV: rrr no mrg Lungs: clear to auscultation, normal respiratory effort Abd: Soft, Nontender Ext: no edema Skin: lichenified erythematous nontender/nonpruritic rash left lower abd Neuro: nonfocal MSK: no peripheral joint swelling/tenderness/warmth; back spines mildly tender mid lumbar area Psych alert/oriented  Lab Results Lab Results  Component Value Date   WBC 14.9 (H) 12/29/2020   HGB 12.5 12/29/2020   HCT 38.2 12/29/2020   MCV 92.0 12/29/2020   PLT 121 (L) 12/29/2020    Lab Results  Component Value Date   CREATININE 1.28 (H) 12/29/2020   BUN 20 12/29/2020   NA 135 12/29/2020   K 4.1 12/29/2020   CL 100  12/29/2020   CO2 21 (L) 12/29/2020    Lab Results  Component Value Date   ALT 14 12/28/2020   AST 19 12/28/2020   ALKPHOS 137 (H) 12/28/2020   BILITOT 1.3 (H) 12/28/2020      Microbiology: Recent Results (from the past 240 hour(s))  Blood culture (routine single)     Status: None (Preliminary result)   Collection Time: 12/28/20  6:00 PM   Specimen: BLOOD  Result Value Ref Range Status   Specimen Description   Final    BLOOD RIGHT ANTECUBITAL Performed at [facility], [address] [place], [place]    Special Requests   Final    BOTTLES DRAWN AEROBIC AND ANAEROBIC Blood Culture adequate volume Performed at [facility], [address], [place], [place]    Culture  Setup Time   Final    GRAM POSITIVE COCCI IN CHAINS IN PAIRS IN BOTH AEROBIC AND ANAEROBIC BOTTLES CRITICAL RESULT CALLED TO, READ BACK BY AND VERIFIED WITH: J,MILLEN PHARMD @  1305 12/29/20 EB Performed at [facility], [address], [place], [place] [address]    Culture GRAM POSITIVE COCCI  Final   Report Status PENDING  Incomplete  Blood Culture ID Panel (Reflexed)     Status: Abnormal   Collection Time: 12/28/20  6:00 PM  Result Value Ref Range Status   Enterococcus faecalis NOT DETECTED NOT DETECTED Final   Enterococcus Faecium NOT DETECTED NOT DETECTED Final   Listeria monocytogenes NOT DETECTED NOT DETECTED Final   Staphylococcus species NOT DETECTED NOT DETECTED Final   Staphylococcus aureus (BCID) NOT DETECTED NOT DETECTED Final   Staphylococcus epidermidis NOT DETECTED NOT DETECTED Final   Staphylococcus lugdunensis NOT DETECTED NOT DETECTED Final   Streptococcus species DETECTED (A) NOT DETECTED Final    Comment: Not Enterococcus species, Streptococcus agalactiae, Streptococcus pyogenes, or Streptococcus pneumoniae. CRITICAL RESULT CALLED TO, READ BACK BY AND VERIFIED WITH: J,MILLEN PHARMD  12/29/20 EB    Streptococcus  agalactiae NOT DETECTED NOT DETECTED Final   Streptococcus pneumoniae NOT DETECTED NOT DETECTED Final   Streptococcus pyogenes NOT DETECTED NOT DETECTED Final   A.calcoaceticus-baumannii NOT DETECTED NOT DETECTED Final   Bacteroides fragilis NOT DETECTED NOT DETECTED Final   Enterobacterales NOT DETECTED NOT DETECTED Final   Enterobacter cloacae complex NOT DETECTED NOT DETECTED Final   Escherichia coli NOT DETECTED NOT DETECTED Final   Klebsiella aerogenes NOT DETECTED NOT DETECTED Final   Klebsiella oxytoca NOT DETECTED NOT DETECTED Final   Klebsiella pneumoniae NOT DETECTED NOT DETECTED Final   Proteus species NOT DETECTED NOT DETECTED Final   Salmonella species NOT DETECTED NOT DETECTED Final   Serratia marcescens NOT DETECTED NOT DETECTED Final   Haemophilus influenzae NOT DETECTED NOT DETECTED Final   Neisseria meningitidis NOT DETECTED NOT DETECTED Final   Pseudomonas aeruginosa NOT DETECTED NOT DETECTED Final   Stenotrophomonas maltophilia NOT DETECTED NOT DETECTED Final   Candida albicans NOT DETECTED NOT DETECTED Final   Candida auris NOT DETECTED NOT DETECTED Final   Candida glabrata NOT DETECTED NOT DETECTED Final   Candida krusei NOT DETECTED NOT DETECTED Final   Candida parapsilosis NOT DETECTED NOT DETECTED Final   Candida tropicalis NOT DETECTED NOT DETECTED Final   Cryptococcus neoformans/gattii NOT DETECTED NOT DETECTED Final    Comment: Performed at [facility], [address], [place], [place] [address]  Resp Panel by RT-PCR (Flu A&B, Covid) Nasopharyngeal Swab     Status: None   Collection Time: 12/28/20  6:08 PM   Specimen: Nasopharyngeal Swab; Nasopharyngeal(NP) swabs in vial transport medium  Result Value Ref Range Status   SARS Coronavirus 2 by RT PCR NEGATIVE NEGATIVE Final    Comment: (NOTE) SARS-CoV-2 target nucleic acids are NOT DETECTED.  The SARS-CoV-2 RNA is generally detectable in upper respiratory specimens during the acute phase of  infection. The lowest concentration of SARS-CoV-2 viral copies this assay can detect is 138 copies/mL. A negative result does not preclude SARS-Cov-2 infection and should not be used as the sole basis for treatment or other patient management decisions. A negative result may occur with  improper specimen collection/handling, submission of specimen other than nasopharyngeal swab, presence of viral mutation(s) within the areas targeted by this assay, and inadequate number of viral copies(<138 copies/mL). A negative result must be combined with clinical observations, patient history, and epidemiological information. The expected result is Negative.  Fact Sheet for Patients:  BloggerCourse.com  Fact Sheet for Healthcare Providers:  SeriousBroker.it  This test is no t yet approved  or cleared by the [place] and  has been authorized for detection and/or diagnosis of SARS-CoV-2 by FDA under an Emergency Use Authorization (EUA). This EUA will remain  in effect (meaning this test can be used) for the duration of the COVID-19 declaration under Section 564(b)(1) of the Act, 21 U.S.C.section 360bbb-3(b)(1), unless the authorization is terminated  or revoked sooner.       Influenza A by PCR NEGATIVE NEGATIVE Final   Influenza B by PCR NEGATIVE NEGATIVE Final    Comment: (NOTE) The Xpert Xpress SARS-CoV-2/FLU/RSV plus assay is intended as an aid in the diagnosis of influenza from Nasopharyngeal swab specimens and should not be used as a sole basis for treatment. Nasal washings and aspirates are unacceptable for Xpert Xpress SARS-CoV-2/FLU/RSV testing.  Fact Sheet for Patients: BloggerCourse.com  Fact Sheet for Healthcare Providers: SeriousBroker.it  This test is not yet approved or cleared by the [place] FDA and has been authorized for detection and/or diagnosis of SARS-CoV-2  by FDA under an Emergency Use Authorization (EUA). This EUA will remain in effect (meaning this test can be used) for the duration of the COVID-19 declaration under Section 564(b)(1) of the Act, 21 U.S.C. section 360bbb-3(b)(1), unless the authorization is terminated or revoked.  Performed at [facility], [address], [place], [place] [address]   MRSA Next Gen by PCR, Nasal     Status: None   Collection Time: 12/29/20 12:20 AM   Specimen: Nasal Mucosa; Nasal Swab  Result Value Ref Range Status   MRSA by PCR Next Gen NOT DETECTED NOT DETECTED Final    Comment: (NOTE) The GeneXpert MRSA Assay (FDA approved for NASAL specimens only), is one component of a comprehensive MRSA colonization surveillance program. It is not intended to diagnose MRSA infection nor to guide or monitor treatment for MRSA infections. Test performance is not FDA approved in patients less than 58 years old. Performed at [facility], [address] [place], [place] [address]      Serology:    Imaging: If present, [facility] imagings (plain films, ct scans, and mri) have been personally visualized and interpreted; radiology reports have been reviewed. Decision making incorporated into the Impression / Recommendations.   9/24 mri thoracic spine 1. Technically limited exam due to extensive motion artifact. 2. No acute abnormality within the thoracic spine. No significant disc pathology or spinal stenosis. 3. Multilevel facet hypertrophy throughout the thoracic spine as detailed above. Findings could contribute to underlying back pain. Associated mild to moderate bilateral foraminal narrowing at T5-6 through T8-9.  9/24 mri lumbar spine 1. Limited exam due to extensive motion artifact. 2. Postoperative changes from prior posterior fusion with decompression at L1 through the sacrum. Residual mild to moderate left lateral recess narrowing at the L3-4 level. No other significant  residual spinal stenosis. 3. Reactive marrow edema about the L5-S1 interspace, favored to be related to prior surgery and/or hardware loosening as seen on prior CT. Possible infection/osteomyelitis difficult to exclude, and could be considered in the correct clinical setting. Correlation with symptomatology and laboratory values recommended. No visible collections. No other convincing evidence for acute infection within the lumbar spine. 4. Residual mild-to-moderate left L2 and bilateral L3 foraminal stenosis as above   9/24 lumbar spine ct Mild age-indeterminate anterior wedge vertebral compression deformity of the T11 vertebral body.   Mild age-indeterminate L2 inferior endplate vertebral compression deformity.   Age-indeterminate L3 anterior wedge vertebral compression deformity (40% height loss).   Lumbar  spondylosis and postoperative changes, with multilevel spinal fusion, as detailed. Streak and beam hardening artifact arising from spinal fusion hardware precludes evaluation of the spinal canal at multiple levels. Sites of mild and moderate foraminal stenosis, as detailed.   Lucency surrounding the screws within the bilateral sacral ala compatible with hardware loosening.  Raymondo Band, MD [facility] [phone] pager    12/29/2020, 1:15 PM

## 2020-12-29 NOTE — Progress Notes (Signed)
PHARMACY - PHYSICIAN COMMUNICATION CRITICAL VALUE ALERT - BLOOD CULTURE IDENTIFICATION (BCID)  Tonya Baker is an 70 y.o. female who presented to [facility] on 12/28/2020 with a chief complaint of back pain  Assessment:  Patient admitted 9/25 with back pain after back surgery in March 2022 and history of Ecoli UTI in July. Patient with fever, elevated lactic acid, and hypotension requiring pressors. Blood cultures sent and 1 set out of 2 (so 2 out of 4 bottles) are positive for GPC in pairs and chains. BCID is positive for streptococcus species.   Name of physician (or Provider) Contacted: Dr. Thora Lance  Current antibiotics: Vancomycin and Cefepime  Changes to prescribed antibiotics recommended:  Narrowed to Ceftriaxone 2g IV every 24 hours  Results for orders placed or performed during the hospital encounter of 12/28/20  Blood Culture ID Panel (Reflexed) (Collected: 12/28/2020  6:00 PM)  Result Value Ref Range   Enterococcus faecalis NOT DETECTED NOT DETECTED   Enterococcus Faecium NOT DETECTED NOT DETECTED   Listeria monocytogenes NOT DETECTED NOT DETECTED   Staphylococcus species NOT DETECTED NOT DETECTED   Staphylococcus aureus (BCID) NOT DETECTED NOT DETECTED   Staphylococcus epidermidis NOT DETECTED NOT DETECTED   Staphylococcus lugdunensis NOT DETECTED NOT DETECTED   Streptococcus species DETECTED (A) NOT DETECTED   Streptococcus agalactiae NOT DETECTED NOT DETECTED   Streptococcus pneumoniae NOT DETECTED NOT DETECTED   Streptococcus pyogenes NOT DETECTED NOT DETECTED   A.calcoaceticus-baumannii NOT DETECTED NOT DETECTED   Bacteroides fragilis NOT DETECTED NOT DETECTED   Enterobacterales NOT DETECTED NOT DETECTED   Enterobacter cloacae complex NOT DETECTED NOT DETECTED   Escherichia coli NOT DETECTED NOT DETECTED   Klebsiella aerogenes NOT DETECTED NOT DETECTED   Klebsiella oxytoca NOT DETECTED NOT DETECTED   Klebsiella pneumoniae NOT DETECTED NOT DETECTED   Proteus species NOT  DETECTED NOT DETECTED   Salmonella species NOT DETECTED NOT DETECTED   Serratia marcescens NOT DETECTED NOT DETECTED   Haemophilus influenzae NOT DETECTED NOT DETECTED   Neisseria meningitidis NOT DETECTED NOT DETECTED   Pseudomonas aeruginosa NOT DETECTED NOT DETECTED   Stenotrophomonas maltophilia NOT DETECTED NOT DETECTED   Candida albicans NOT DETECTED NOT DETECTED   Candida auris NOT DETECTED NOT DETECTED   Candida glabrata NOT DETECTED NOT DETECTED   Candida krusei NOT DETECTED NOT DETECTED   Candida parapsilosis NOT DETECTED NOT DETECTED   Candida tropicalis NOT DETECTED NOT DETECTED   Cryptococcus neoformans/gattii NOT DETECTED NOT DETECTED    Link Snuffer, PharmD, BCPS, BCCCP Clinical Pharmacist Please refer to [facility] for [facility] Pharmacy numbers 12/29/2020  1:09 PM

## 2020-12-29 NOTE — Progress Notes (Signed)
NAME:  Tonya Baker MRN:  [id number] DOB:  06-30-1950 LOS: 1 ADMISSION DATE:  12/28/2020 DATE OF SERVICE:  12/28/2020  CHIEF COMPLAINT:  back pain, hypotension  HISTORY & PHYSICAL  History of Present Illness  This 70 y.o. RACE @GENDER @ nursing home reisdent presented to the [facility] Emergency Department via EMS with complaints of back pain. She apparently recently moved to [facility] from [facility].  She reports that she had back surgery in March 2022 that was complication with posoperative infection, requiring a 2nd and 3rd surgery in the weeks following the original surgery.  She developed Speirs onset low back pain with pain radiating to the left hip about 2 days ago.  No trauma.  In the ER, the patient presented with a normal blood pressure.  She was given a Percocet, after which her blood pressure started trending downward.  As the patient presented with fever, the ER staff interpreted this to mean that the patient was progressing to septic shock, and the patient got 1 L IV fluid bolus and started a 2nd L, at which time norepinephrine infusion was also ordered by the ER provider.  PCCM asked to admit.  Lactate 1.1-->1.8.  Of note, the patient takes midodrine 10 mg PO TID, which was not administered in the ER.  After administration of the midodrine dose, the patient has maintained MAP > 65, albeit with labile SBP ranging from 78-108 with a narrow pulse pressure.  She is on multiple medications but is unable to tell me the indications for her medications.  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  She also states that she has been instructed to wear a CPAP mask for obstructive sleep apnea but discontinued use because she didn't think she needed it any more.  REVIEW OF SYSTEMS Constitutional: Fever.  Chills.  No weight loss. No night sweats. No fatigue. HEENT: Dry mouth.  No headaches, dysphagia, sore throat, otalgia, nasal congestion, PND CV:  No chest pain, orthopnea, PND, swelling in lower  extremities, palpitations GI:  No abdominal pain, nausea, vomiting, diarrhea, change in bowel pattern, anorexia Resp: No DOE, rest dyspnea, cough, mucus, hemoptysis, wheezing  GU: no dysuria, change in color of urine, no urgency or frequency.  No flank pain. MS:  Lower thoracic/high lumbar back pain with radiation to L hip. No joint pain or swelling. No myalgias,  No decreased range of motion.  Psych:  No change in mood or affect. No memory loss. Skin: no rash or lesions.   Past Medical/Surgical/Social/Family History   Past Medical History:  Diagnosis Date   Back pain    Obstructive sleep apnea    Type 2 diabetes mellitus with diabetic autonomic neuropathy, with long-term current use of insulin [facility])     Past Surgical History:  Procedure Laterality Date   BACK SURGERY N/A 06/2020    Social History   Tobacco Use   Smoking status: Not on file   Smokeless tobacco: Not on file  Substance Use Topics   Alcohol use: Not on file    No family history on file.   Procedures:     Significant Diagnostic Tests:     Micro Data:   Results for orders placed or performed during the hospital encounter of 12/28/20  Blood culture (routine single)     Status: None (Preliminary result)   Collection Time: 12/28/20  6:00 PM   Specimen: BLOOD  Result Value Ref Range Status   Specimen Description   Final    BLOOD  RIGHT ANTECUBITAL Performed at [facility], [address] [place], [place] [address]    Special Requests   Final    BOTTLES DRAWN AEROBIC AND ANAEROBIC Blood Culture adequate volume Performed at [facility], [address], [place], [place] [address]    Culture  Setup Time   Final    GRAM POSITIVE COCCI IN CHAINS IN PAIRS IN BOTH AEROBIC AND ANAEROBIC BOTTLES Organism ID to follow Performed at [facility], [address] [place], [place] [address]    Culture GRAM POSITIVE COCCI  Final   Report Status PENDING  Incomplete   Resp Panel by RT-PCR (Flu A&B, Covid) Nasopharyngeal Swab     Status: None   Collection Time: 12/28/20  6:08 PM   Specimen: Nasopharyngeal Swab; Nasopharyngeal(NP) swabs in vial transport medium  Result Value Ref Range Status   SARS Coronavirus 2 by RT PCR NEGATIVE NEGATIVE Final    Comment: (NOTE) SARS-CoV-2 target nucleic acids are NOT DETECTED.  The SARS-CoV-2 RNA is generally detectable in upper respiratory specimens during the acute phase of infection. The lowest concentration of SARS-CoV-2 viral copies this assay can detect is 138 copies/mL. A negative result does not preclude SARS-Cov-2 infection and should not be used as the sole basis for treatment or other patient management decisions. A negative result may occur with  improper specimen collection/handling, submission of specimen other than nasopharyngeal swab, presence of viral mutation(s) within the areas targeted by this assay, and inadequate number of viral copies(<138 copies/mL). A negative result must be combined with clinical observations, patient history, and epidemiological information. The expected result is Negative.  Fact Sheet for Patients:  BloggerCourse.com  Fact Sheet for Healthcare Providers:  SeriousBroker.it  This test is no t yet approved or cleared by the [place] FDA and  has been authorized for detection and/or diagnosis of SARS-CoV-2 by FDA under an Emergency Use Authorization (EUA). This EUA will remain  in effect (meaning this test can be used) for the duration of the COVID-19 declaration under Section 564(b)(1) of the Act, 21 U.S.C.section 360bbb-3(b)(1), unless the authorization is terminated  or revoked sooner.       Influenza A by PCR NEGATIVE NEGATIVE Final   Influenza B by PCR NEGATIVE NEGATIVE Final    Comment: (NOTE) The Xpert Xpress SARS-CoV-2/FLU/RSV plus assay is intended as an aid in the diagnosis of influenza from  Nasopharyngeal swab specimens and should not be used as a sole basis for treatment. Nasal washings and aspirates are unacceptable for Xpert Xpress SARS-CoV-2/FLU/RSV testing.  Fact Sheet for Patients: BloggerCourse.com  Fact Sheet for Healthcare Providers: SeriousBroker.it  This test is not yet approved or cleared by the [place] FDA and has been authorized for detection and/or diagnosis of SARS-CoV-2 by FDA under an Emergency Use Authorization (EUA). This EUA will remain in effect (meaning this test can be used) for the duration of the COVID-19 declaration under Section 564(b)(1) of the Act, 21 U.S.C. section 360bbb-3(b)(1), unless the authorization is terminated or revoked.  Performed at [facility], [address] [place], [place] [address]   MRSA Next Gen by PCR, Nasal     Status: None   Collection Time: 12/29/20 12:20 AM   Specimen: Nasal Mucosa; Nasal Swab  Result Value Ref Range Status   MRSA by PCR Next Gen NOT DETECTED NOT DETECTED Final    Comment: (NOTE) The GeneXpert MRSA Assay (FDA approved for NASAL specimens only), is one component of a comprehensive  MRSA colonization surveillance program. It is not intended to diagnose MRSA infection nor to guide or monitor treatment for MRSA infections. Test performance is not FDA approved in patients less than 18 years old. Performed at [facility], [address], [place], [place] [address]       Antimicrobials:  Cefepime/vancomycin (9/24>>)    Interim history/subjective:   Alert, still has some lower back pain radiating to L hip. BP improving a bit this afternoon.  Objective   BP (!) 76/45   Pulse 80   Temp 98.8 F (37.1 C) (Oral)   Resp (!) 21   Ht 5\' 4"  (1.626 m)   Wt 93.9 kg   SpO2 94%   BMI 35.53 kg/m     Filed Weights   12/28/20 1821 12/29/20 0650  Weight: 108.9 kg 93.9 kg    Intake/Output Summary (Last 24 hours)  at 12/29/2020 1208 Last data filed at 12/29/2020 1100 Gross per 24 hour  Intake 1278.12 ml  Output 3300 ml  Net -2021.88 ml        Examination: General appearance: 70 y.o., female, NAD, conversant  Eyes: anicteric sclerae, moist conjunctivae; no lid-lag; tracking appropriately HENT: NCAT; dry mm Neck: Trachea midline; no lymphadenopathy, no JVD Lungs: CTAB, no crackles, no wheeze, with normal respiratory effort CV: RRR, no MRGs  Abdomen: Soft, non-tender; non-distended, BS present  Extremities: trace peripheral edema, warm Skin: Normal temperature, turgor and texture; no rash Neuro: Alert and oriented to person and place, grossly full to strength testing   TTE today: G2DD, mildly elevated RVSP, no vegetations, full IVC   Resolved Hospital Problem list      Assessment & Plan:   Hypotension: Chronic hypotension on midodrine 10 TID. Perhaps medication effect vs less likely developing bloodstream infection from acute on chronic OM. - increase midodrine to 15 TID - hold sedating medications as able while treating her back pain - mgmt of possible acute on chronic OM as below - PT/mobilize  Possible sepsis due to acute on chronic vertebral OM: Elevated inflammatory markers. Possible L5-S1 infx vs inflammation related to hardware loosening. Strep on cultures.  - ceftriaxone for now - follow cultures and narrow as able - ID consult - [facility] from [facility]  OSA: - CPAP at night  DM2: - SSI for now, re-evaluate need for home basal as we follow her diet/glycemic pattern   Best practice:  Diet: diabetic Pain/Anxiety/Delirium protocol (if indicated): N/A VAP protocol (if indicated): N/A DVT prophylaxis: heparin GI prophylaxis: Protonix Glucose control: sliding scale insulin Mobility/Activity: bedrest   Code Status: Full Code Family Communication:   no family at bedside Disposition: if remains off levo, transfer to floor     [facility]  Pulmonary/Critical Care

## 2020-12-29 NOTE — Progress Notes (Signed)
eLink Physician-Brief Progress Note Patient Name: Tonya Baker DOB: 30-Jul-1950 MRN: [id number]   Date of Service  12/29/2020  HPI/Events of Note  Received request for cough medication. Patient having clear productive cough. CXR without active disease  eICU Interventions  Mucinex DM prn ordered     Intervention Category Minor Interventions: Routine modifications to care plan (e.g. PRN medications for pain, fever)  Rosalie Gums Kaarin Pardy 12/29/2020, 9:55 PM

## 2020-12-30 ENCOUNTER — Other Ambulatory Visit: Payer: Self-pay | Admitting: Internal Medicine

## 2020-12-30 DIAGNOSIS — B955 Unspecified streptococcus as the cause of diseases classified elsewhere: Secondary | ICD-10-CM | POA: Diagnosis not present

## 2020-12-30 DIAGNOSIS — M4626 Osteomyelitis of vertebra, lumbar region: Secondary | ICD-10-CM

## 2020-12-30 DIAGNOSIS — R6521 Severe sepsis with septic shock: Secondary | ICD-10-CM

## 2020-12-30 DIAGNOSIS — R7881 Bacteremia: Secondary | ICD-10-CM

## 2020-12-30 DIAGNOSIS — E1140 Type 2 diabetes mellitus with diabetic neuropathy, unspecified: Secondary | ICD-10-CM | POA: Diagnosis not present

## 2020-12-30 DIAGNOSIS — A419 Sepsis, unspecified organism: Secondary | ICD-10-CM | POA: Diagnosis not present

## 2020-12-30 LAB — COMPREHENSIVE METABOLIC PANEL
ALT: 12 U/L (ref 0–44)
AST: 15 U/L (ref 15–41)
Albumin: 1.9 g/dL — ABNORMAL LOW (ref 3.5–5.0)
Alkaline Phosphatase: 129 U/L — ABNORMAL HIGH (ref 38–126)
Anion gap: 10 (ref 5–15)
BUN: 15 mg/dL (ref 8–23)
CO2: 26 mmol/L (ref 22–32)
Calcium: 8.3 mg/dL — ABNORMAL LOW (ref 8.9–10.3)
Chloride: 99 mmol/L (ref 98–111)
Creatinine, Ser: 1.22 mg/dL — ABNORMAL HIGH (ref 0.44–1.00)
GFR, Estimated: 48 mL/min — ABNORMAL LOW (ref >60)
Glucose, Bld: 242 mg/dL — ABNORMAL HIGH (ref 70–99)
Potassium: 3 mmol/L — ABNORMAL LOW (ref 3.5–5.1)
Sodium: 135 mmol/L (ref 135–145)
Total Bilirubin: 0.8 mg/dL (ref 0.3–1.2)
Total Protein: 6.1 g/dL — ABNORMAL LOW (ref 6.5–8.1)

## 2020-12-30 LAB — GLUCOSE, CAPILLARY
Glucose-Capillary: 155 mg/dL — ABNORMAL HIGH (ref 70–99)
Glucose-Capillary: 173 mg/dL — ABNORMAL HIGH (ref 70–99)
Glucose-Capillary: 201 mg/dL — ABNORMAL HIGH (ref 70–99)
Glucose-Capillary: 206 mg/dL — ABNORMAL HIGH (ref 70–99)
Glucose-Capillary: 262 mg/dL — ABNORMAL HIGH (ref 70–99)
Glucose-Capillary: 77 mg/dL (ref 70–99)

## 2020-12-30 LAB — CBC WITH DIFFERENTIAL/PLATELET
Abs Immature Granulocytes: 0.1 10*3/uL — ABNORMAL HIGH (ref 0.00–0.07)
Basophils Absolute: 0.1 10*3/uL (ref 0.0–0.1)
Basophils Relative: 1 %
Eosinophils Absolute: 0.3 10*3/uL (ref 0.0–0.5)
Eosinophils Relative: 3 %
HCT: 32.8 % — ABNORMAL LOW (ref 36.0–46.0)
Hemoglobin: 10.6 g/dL — ABNORMAL LOW (ref 12.0–15.0)
Immature Granulocytes: 1 %
Lymphocytes Relative: 7 %
Lymphs Abs: 0.7 10*3/uL (ref 0.7–4.0)
MCH: 29.7 pg (ref 26.0–34.0)
MCHC: 32.3 g/dL (ref 30.0–36.0)
MCV: 91.9 fL (ref 80.0–100.0)
Monocytes Absolute: 0.7 10*3/uL (ref 0.1–1.0)
Monocytes Relative: 7 %
Neutro Abs: 8 10*3/uL — ABNORMAL HIGH (ref 1.7–7.7)
Neutrophils Relative %: 81 %
Platelets: 187 10*3/uL (ref 150–400)
RBC: 3.57 MIL/uL — ABNORMAL LOW (ref 3.87–5.11)
RDW: 13.3 % (ref 11.5–15.5)
WBC: 9.8 10*3/uL (ref 4.0–10.5)
nRBC: 0 % (ref 0.0–0.2)

## 2020-12-30 LAB — PROTIME-INR
INR: 1.3 — ABNORMAL HIGH (ref 0.8–1.2)
Prothrombin Time: 15.9 seconds — ABNORMAL HIGH (ref 11.4–15.2)

## 2020-12-30 LAB — MAGNESIUM: Magnesium: 2.1 mg/dL (ref 1.7–2.4)

## 2020-12-30 MED ORDER — VANCOMYCIN HCL 750 MG/150ML IV SOLN
750.0000 mg | INTRAVENOUS | Status: DC
  Administered 2020-12-31: 750 mg via INTRAVENOUS
  Filled 2020-12-30: qty 150

## 2020-12-30 MED ORDER — INSULIN DETEMIR 100 UNIT/ML ~~LOC~~ SOLN
18.0000 [IU] | Freq: Every day | SUBCUTANEOUS | Status: DC
  Administered 2020-12-30 – 2020-12-31 (×2): 18 [IU] via SUBCUTANEOUS
  Filled 2020-12-30 (×3): qty 0.18

## 2020-12-30 MED ORDER — MIDODRINE HCL 5 MG PO TABS
20.0000 mg | ORAL_TABLET | Freq: Three times a day (TID) | ORAL | Status: DC
  Administered 2020-12-30 – 2020-12-31 (×2): 20 mg via ORAL
  Filled 2020-12-30 (×2): qty 4

## 2020-12-30 MED ORDER — TRAMADOL HCL 50 MG PO TABS
25.0000 mg | ORAL_TABLET | Freq: Three times a day (TID) | ORAL | Status: DC | PRN
  Administered 2020-12-30 – 2021-01-03 (×12): 25 mg via ORAL
  Filled 2020-12-30 (×12): qty 1

## 2020-12-30 MED ORDER — LACTATED RINGERS IV BOLUS
1000.0000 mL | Freq: Once | INTRAVENOUS | Status: AC
  Administered 2020-12-30: 1000 mL via INTRAVENOUS

## 2020-12-30 MED ORDER — OXYCODONE HCL 5 MG PO TABS: 10.0000 mg | ORAL_TABLET | Freq: Three times a day (TID) | ORAL | Status: DC | PRN

## 2020-12-30 MED ORDER — POTASSIUM CHLORIDE CRYS ER 20 MEQ PO TBCR
40.0000 meq | EXTENDED_RELEASE_TABLET | Freq: Once | ORAL | Status: AC
  Administered 2020-12-30: 40 meq via ORAL
  Filled 2020-12-30: qty 2

## 2020-12-30 NOTE — Progress Notes (Addendum)
[facility]  Date of Admission:  12/28/2020     Total days of antibiotics 3         ASSESSMENT:  Ms. Saia has Streptococcus species bacteremia with suspicion of her back as the source of infection. Reportedly on suppressive antibiotics and awaiting [facility] [place] [place]. Will continue with vancomycin and ceftriaxone with narrowing as able pending records. Repeat blood cultures have been ordered to determine clearance of bacteremia. 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 ID will continue to follow.  PLAN:  Continue vancomycin and ceftriaxone.  Monitor renal function and vancomycin levels for therapuetic drug monitoring.  Await records to determine previous course of treatment.  Repeat blood cultures to ensure clearance of bacteremia.  Remaining supportive care per primary team.   Principal Problem:   Hypotension Active Problems:   Back pain   CKD (chronic kidney disease) stage 3, GFR 30-59 ml/min (HCC)   Spinal stenosis   Obstructive sleep apnea   Sepsis (HCC)   Type 2 diabetes mellitus with diabetic neuropathy, with long-term current use of insulin (HCC)   Volume overload   Hardware complicating wound infection (HCC)   Vertebral osteomyelitis (HCC)    aspirin  81 mg Oral q morning   atorvastatin  40 mg Oral QHS   Chlorhexidine Gluconate Cloth  6 each Topical Daily   gabapentin  100 mg Oral TID   heparin  5,000 Units Subcutaneous Q8H   hydrocerin  1 application Topical Daily   insulin aspart  0-20 Units Subcutaneous Q4H   insulin detemir  18 Units Subcutaneous Daily   midodrine  15 mg Oral TID with meals   multivitamin with minerals  1 tablet Oral q morning   pantoprazole  40 mg Oral QHS   sertraline  12.5 mg Oral Daily    SUBJECTIVE:  Afebrile overnight with no acute events. Continues to have back pain with radiation down the left side. Does not recall previous antibiotics.    Allergies   Allergen Reactions   Cortisone Other (See Comments)    Unknown reaction   Hydrocortisone Other (See Comments)    Unknown reaction   Latex Other (See Comments)    Unknown reaction     Review of Systems: Review of Systems  Constitutional:  Negative for chills, fever and weight loss.  Respiratory:  Negative for cough, shortness of breath and wheezing.   Cardiovascular:  Negative for chest pain and leg swelling.  Gastrointestinal:  Negative for abdominal pain, constipation, diarrhea, nausea and vomiting.  Musculoskeletal:  Positive for back pain.  Skin:  Negative for rash.     OBJECTIVE: Vitals:   12/30/20 0730 12/30/20 0742 12/30/20 0745 12/30/20 0800  BP: 132/75  (!) 68/39 (!) 78/51  Pulse: 78  76 76  Resp: 16  17 17   Temp:  98.5 F (36.9 C)    TempSrc:  Oral    SpO2: 97%  96% 93%  Weight:      Height:       Body mass index is 34.89 kg/m.  Physical Exam Constitutional:      General: She is not in acute distress.    Appearance: She is well-developed.     Comments: Lying in bed; pleasant.   Cardiovascular:     Rate and Rhythm: Normal rate and regular rhythm.     Heart sounds: Normal heart sounds.  Pulmonary:     Effort: Pulmonary effort is  normal.     Breath sounds: Normal breath sounds.  Skin:    General: Skin is warm and dry.  Neurological:     Mental Status: She is alert and oriented to person, place, and time.     Comments: Equal movement of bilateral lower extremities.   Psychiatric:        Behavior: Behavior normal.        Thought Content: Thought content normal.        Judgment: Judgment normal.    Lab Results Lab Results  Component Value Date   WBC 14.9 (H) 12/29/2020   HGB 12.5 12/29/2020   HCT 38.2 12/29/2020   MCV 92.0 12/29/2020   PLT 121 (L) 12/29/2020    Lab Results  Component Value Date   CREATININE 1.28 (H) 12/29/2020   BUN 20 12/29/2020   NA 135 12/29/2020   K 4.1 12/29/2020   CL 100 12/29/2020   CO2 21 (L) 12/29/2020    Lab  Results  Component Value Date   ALT 14 12/28/2020   AST 19 12/28/2020   ALKPHOS 137 (H) 12/28/2020   BILITOT 1.3 (H) 12/28/2020     Microbiology: Recent Results (from the past 240 hour(s))  Blood culture (routine single)     Status: None (Preliminary result)   Collection Time: 12/28/20  6:00 PM   Specimen: BLOOD  Result Value Ref Range Status   Specimen Description   Final    BLOOD RIGHT ANTECUBITAL Performed at [facility], [address], [place], [place] [address]    Special Requests   Final    BOTTLES DRAWN AEROBIC AND ANAEROBIC Blood Culture adequate volume Performed at [facility], [address] [place], [place] [address]    Culture  Setup Time   Final    GRAM POSITIVE COCCI IN CHAINS IN PAIRS IN BOTH AEROBIC AND ANAEROBIC BOTTLES CRITICAL RESULT CALLED TO, READ BACK BY AND VERIFIED WITH: J,MILLEN PHARMD @1305  12/29/20 EB Performed at [facility], [address], [place], [place]    Culture GRAM POSITIVE COCCI  Final   Report Status PENDING  Incomplete  Blood Culture ID Panel (Reflexed)     Status: Abnormal   Collection Time: 12/28/20  6:00 PM  Result Value Ref Range Status   Enterococcus faecalis NOT DETECTED NOT DETECTED Final   Enterococcus Faecium NOT DETECTED NOT DETECTED Final   Listeria monocytogenes NOT DETECTED NOT DETECTED Final   Staphylococcus species NOT DETECTED NOT DETECTED Final   Staphylococcus aureus (BCID) NOT DETECTED NOT DETECTED Final   Staphylococcus epidermidis NOT DETECTED NOT DETECTED Final   Staphylococcus lugdunensis NOT DETECTED NOT DETECTED Final   Streptococcus species DETECTED (A) NOT DETECTED Final    Comment: Not Enterococcus species, Streptococcus agalactiae, Streptococcus pyogenes, or Streptococcus pneumoniae. CRITICAL RESULT CALLED TO, READ BACK BY AND VERIFIED WITH: J,MILLEN PHARMD @1305  12/29/20 EB    Streptococcus agalactiae NOT DETECTED NOT DETECTED Final    Streptococcus pneumoniae NOT DETECTED NOT DETECTED Final   Streptococcus pyogenes NOT DETECTED NOT DETECTED Final   A.calcoaceticus-baumannii NOT DETECTED NOT DETECTED Final   Bacteroides fragilis NOT DETECTED NOT DETECTED Final   Enterobacterales NOT DETECTED NOT DETECTED Final   Enterobacter cloacae complex NOT DETECTED NOT DETECTED Final   Escherichia coli NOT DETECTED NOT DETECTED Final   Klebsiella aerogenes NOT DETECTED NOT DETECTED Final   Klebsiella oxytoca NOT DETECTED NOT DETECTED Final   Klebsiella pneumoniae NOT DETECTED NOT DETECTED Final   Proteus species  NOT DETECTED NOT DETECTED Final   Salmonella species NOT DETECTED NOT DETECTED Final   Serratia marcescens NOT DETECTED NOT DETECTED Final   Haemophilus influenzae NOT DETECTED NOT DETECTED Final   Neisseria meningitidis NOT DETECTED NOT DETECTED Final   Pseudomonas aeruginosa NOT DETECTED NOT DETECTED Final   Stenotrophomonas maltophilia NOT DETECTED NOT DETECTED Final   Candida albicans NOT DETECTED NOT DETECTED Final   Candida auris NOT DETECTED NOT DETECTED Final   Candida glabrata NOT DETECTED NOT DETECTED Final   Candida krusei NOT DETECTED NOT DETECTED Final   Candida parapsilosis NOT DETECTED NOT DETECTED Final   Candida tropicalis NOT DETECTED NOT DETECTED Final   Cryptococcus neoformans/gattii NOT DETECTED NOT DETECTED Final    Comment: Performed at [facility], [address], [place], [place] [address]  Resp Panel by RT-PCR (Flu A&B, Covid) Nasopharyngeal Swab     Status: None   Collection Time: 12/28/20  6:08 PM   Specimen: Nasopharyngeal Swab; Nasopharyngeal(NP) swabs in vial transport medium  Result Value Ref Range Status   SARS Coronavirus 2 by RT PCR NEGATIVE NEGATIVE Final    Comment: (NOTE) SARS-CoV-2 target nucleic acids are NOT DETECTED.  The SARS-CoV-2 RNA is generally detectable in upper respiratory specimens during the acute phase of infection. The lowest concentration of SARS-CoV-2  viral copies this assay can detect is 138 copies/mL. A negative result does not preclude SARS-Cov-2 infection and should not be used as the sole basis for treatment or other patient management decisions. A negative result may occur with  improper specimen collection/handling, submission of specimen other than nasopharyngeal swab, presence of viral mutation(s) within the areas targeted by this assay, and inadequate number of viral copies(<138 copies/mL). A negative result must be combined with clinical observations, patient history, and epidemiological information. The expected result is Negative.  Fact Sheet for Patients:  BloggerCourse.com  Fact Sheet for Healthcare Providers:  SeriousBroker.it  This test is no t yet approved or cleared by the [place] FDA and  has been authorized for detection and/or diagnosis of SARS-CoV-2 by FDA under an Emergency Use Authorization (EUA). This EUA will remain  in effect (meaning this test can be used) for the duration of the COVID-19 declaration under Section 564(b)(1) of the Act, 21 U.S.C.section 360bbb-3(b)(1), unless the authorization is terminated  or revoked sooner.       Influenza A by PCR NEGATIVE NEGATIVE Final   Influenza B by PCR NEGATIVE NEGATIVE Final    Comment: (NOTE) The Xpert Xpress SARS-CoV-2/FLU/RSV plus assay is intended as an aid in the diagnosis of influenza from Nasopharyngeal swab specimens and should not be used as a sole basis for treatment. Nasal washings and aspirates are unacceptable for Xpert Xpress SARS-CoV-2/FLU/RSV testing.  Fact Sheet for Patients: BloggerCourse.com  Fact Sheet for Healthcare Providers: SeriousBroker.it  This test is not yet approved or cleared by the [place] FDA and has been authorized for detection and/or diagnosis of SARS-CoV-2 by FDA under an Emergency Use Authorization  (EUA). This EUA will remain in effect (meaning this test can be used) for the duration of the COVID-19 declaration under Section 564(b)(1) of the Act, 21 U.S.C. section 360bbb-3(b)(1), unless the authorization is terminated or revoked.  Performed at [facility], [address], [place], [place] [address]   Culture, blood (routine x 2)     Status: None (Preliminary result)   Collection Time: 12/28/20 10:30 PM   Specimen: BLOOD  Result Value Ref Range Status   Specimen  Description   Final    BLOOD RIGHT ANTECUBITAL Performed at [facility], [address] [place], [place] [address]    Special Requests   Final    BOTTLES DRAWN AEROBIC AND ANAEROBIC Blood Culture adequate volume Performed at [facility], [address] [place], [place] [address]    Culture  Setup Time   Final    GRAM POSITIVE COCCI IN BOTH AEROBIC AND ANAEROBIC BOTTLES CRITICAL VALUE NOTED.  VALUE IS CONSISTENT WITH PREVIOUSLY REPORTED AND CALLED VALUE. Performed at [facility], [address], [place], [place] [address]    Culture GRAM POSITIVE COCCI  Final   Report Status PENDING  Incomplete  MRSA Next Gen by PCR, Nasal     Status: None   Collection Time: 12/29/20 12:20 AM   Specimen: Nasal Mucosa; Nasal Swab  Result Value Ref Range Status   MRSA by PCR Next Gen NOT DETECTED NOT DETECTED Final    Comment: (NOTE) The GeneXpert MRSA Assay (FDA approved for NASAL specimens only), is one component of a comprehensive MRSA colonization surveillance program. It is not intended to diagnose MRSA infection nor to guide or monitor treatment for MRSA infections. Test performance is not FDA approved in patients less than 54 years old. Performed at [facility], [address], [place], [place] [address]      Marcos Eke, NP [facility]  12/30/2020  10:08 AM

## 2020-12-30 NOTE — Progress Notes (Signed)
Pharmacy Antibiotic Note  Tonya Baker is a 70 y.o. female admitted on 12/28/2020 with  possible osteomyelitis (lumbosacral spine) .  Pharmacy has been consulted for vancomycin dosing  Patient is currently receiving vancomycin 1500 mg IV every 36 hours and ceftriaxone 2 grams IV every 24 hours. To optimize therapy based on clinical picture, we will adjust vancomycin to 750 mg IV every 24 hours based on Scr 1.22 mg/dL and Vd coeff 0.5 L/kg for an est AUC of 457.1.  Plan: Change vancomycin to 750 mg every 24 hours Continue ceftriaxone 2 g IV every 24 hours Monitor renal function, WBC, fever curve, culture data  Height: 5\' 4"  (162.6 cm) Weight: 92.2 kg (203 lb 4.2 oz) IBW/kg (Calculated) : 54.7  Temp (24hrs), Avg:98.3 F (36.8 C), Min:97.8 F (36.6 C), Max:98.6 F (37 C)  Recent Labs  Lab 12/28/20 1613 12/28/20 1707 12/28/20 1803 12/28/20 2330 12/28/20 2331 12/29/20 0130 12/29/20 0132 12/29/20 0534 12/29/20 0937 12/29/20 1557 12/30/20 1121  WBC 13.9*  --   --   --  16.4*  --  14.9*  --   --   --  9.8  CREATININE  --  1.30*  --   --  1.00  --  1.28*  --   --   --  1.22*  LATICACIDVEN  --   --    < > 1.2  --  2.3*  --  2.6* 1.2 0.9  --    < > = values in this interval not displayed.    Estimated Creatinine Clearance: 47.9 mL/min (A) (by C-G formula based on SCr of 1.22 mg/dL (H)).    Allergies  Allergen Reactions   Cortisone Other (See Comments)    Unknown reaction   Hydrocortisone Other (See Comments)    Unknown reaction   Latex Other (See Comments)    Unknown reaction    Antimicrobials this admission: Cefepime 9/24 >> 9/25 Flagyl 9/24 >> 9/25 Vancomycin 9/24 >> Ceftriaxone 9/25 >>  Microbiology results: 9/24 BCx: 2/4 GPC pairs, chains; BCID: strep species 9/25 MRSA PCR: negative  Thank you for allowing pharmacy to be a part of this patient's care.  10/25, PharmD PGY1 Acute Care Pharmacy Resident  Phone: [phone] 12/30/2020  4:08 PM  Please check  [email or web address] for unit-specific pharmacy phone numbers.

## 2020-12-30 NOTE — Consult Note (Signed)
Neurosurgery Consultation  Reason for Consult: Lumbar pseudoarthrosis Referring Physician: Merrily Pew  CC: Back pain  HPI: This is a 70 y.o. woman that presents with concern for post-op infection and hardware loosening. Per my review of her OSH records, she had L3-4/4-5 discitis in 2019 then recurrent discitis in 2021 with back / hip / right L3 symptoms. MRI report states she had fairly extensive disease with L2-3 discitis, epidural abscess, and some paraspinal abscesses. Op notes report that she had L2-S1 laminectomies (although on CT much of these lamina are intact), evacuation of abscess, PLF L2-S1 with BMP along with an open L5-S1 TLIF followed by an L4-5 ALIF in a staged fashion. She now presents with 4d of severe low back pain. She has some pain in the LLE but this is localized around the left knee and does not travel down the leg in a dermatomal pattern. No Pyka weakness, pt is an [employer] fund[employer], states she has some neuropathy in the LLE that started after surgery, so she has stable L foot weakness / numbness but no Roan weakness or numbness. Of note, after her post-op pain resolved, she reports that she did not have any back pain until 4d ago, but she does appear to chronically take oxycodone and MSIR. No change in bowel/bladder control, no upper extremity symptoms. The LBP is sharp in quality, severe in intensity, located in the caudal region of the lumbar spine, no significant radiation into the legs.    ROS: A 14 point ROS was performed and is negative except as noted in the HPI.   PMHx:  Past Medical History:  Diagnosis Date   Back pain    Obstructive sleep apnea    Type 2 diabetes mellitus with diabetic autonomic neuropathy, with long-term current use of insulin (HCC)    FamHx: No family history on file. SocHx:  has no history on file for tobacco use, alcohol use, and drug use.  Exam: Vital signs in last 24 hours: Temp:  [98.1 F (36.7 C)-99.2 F (37.3 C)] 98.1 F (36.7 C) (09/26 1128) Pulse  Rate:  [67-85] 73 (09/26 1015) Resp:  [14-24] 22 (09/26 1015) BP: (68-159)/(39-75) 142/70 (09/26 1015) SpO2:  [87 %-100 %] 96 % (09/26 1015) Weight:  [92.2 kg] 92.2 kg (09/26 0334) General: Awake, alert, cooperative, lying in bed in NAD Head: Normocephalic and atruamatic HEENT: Neck supple Pulmonary: breathing room air comfortably, no evidence of increased work of breathing Cardiac: RRR Abdomen: S NT ND Extremities: Warm and well perfused x4, some deformity and skin changes in the bilateral feet Neuro: Strength 5/5 x4 except 4/5 in L EHL/DF and 4+/5 in L PF, SILTx4 except L>R stocking distribution numbness   Assessment and Plan: 70 y.o. woman w/ recurrent discitis s/p L4-5 ALIF, L2-S1 PLF w/ L5-S1 TLIF. CT L-spine personally reviewed, which shows good fusion at L1-2, L2-3, L3-4, L4-5, w/ L5-S1 pseudoarthrosis. MRI is essentially non-diagnostic due to artifact, but does show some sacral edema c/w the pseudoarthrosis.   -no acute neurosurgical intervention indicated at this time. Discussed w/ the pt, explained that she has a pseudoarthrosis. It has been present longer than 4 days and does not usually cause acute severe pain like this, would not cause her other systemic issues. If her pain doesn't improve after treatment of her systemic condition, can f/u with me in the office after discharge to discuss revision of the pseudoarthrosis. Her left foot drop, if only present post-op, could be due to interbody insertion at L5-S1, radiographically it appears to be  a left sided approach. No change in management for this, not Alarid. The T2 changes in the disc space and sacrum are more likely inflammatory than infectious, in my opinion, given the appearance of the bone on CT. Would also be atypical to have such a delayed recurrent infection with a different organism. If concerns regarding sterility, could have IR do a biopsy of that disc space.  -please call with any concerns or questions  [name],  MD 12/30/20 12:46 PM [facility]

## 2020-12-30 NOTE — Progress Notes (Addendum)
NAME:  Tonya Baker MRN:  [id number] DOB:  1950/07/10 LOS: 2 ADMISSION DATE:  12/28/2020 DATE OF SERVICE:  12/28/2020 CHIEF COMPLAINT:  back pain, hypotension  HISTORY & PHYSICAL  History of Present Illness  This 70 y.o. female nursing home reisdent presented to the [facility] Emergency Department via EMS with complaints of back pain. She apparently recently moved to [name] from [place].  She reports that she had back surgery in March 2021 that was complication with posoperative infection, requiring a 2nd and 3rd surgery in the weeks following the original surgery.  She developed [place] onset low back pain with pain radiating to the left hip about 2 days ago.  No trauma.  In the ER, the patient presented with a normal blood pressure.  She was given a Percocet, after which her blood pressure started trending downward.  As the patient presented with fever, the ER staff interpreted this to mean that the patient was progressing to septic shock, and the patient got 1 L IV fluid bolus and started a 2nd L, at which time norepinephrine infusion was also ordered by the ER provider.  PCCM asked to admit.  Lactate 1.1-->1.8.  Of note, the patient takes midodrine 10 mg PO TID, which was not administered in the ER.  After administration of the midodrine dose, the patient has maintained MAP > 65, albeit with labile SBP ranging from 78-108 with a narrow pulse pressure  Past Medical/Surgical/Social/Family History   Past Medical History:  Diagnosis Date   Back pain    Obstructive sleep apnea    Type 2 diabetes mellitus with diabetic autonomic neuropathy, with long-term current use of insulin [facility])    Past Surgical History:  Procedure Laterality Date   BACK SURGERY N/A 06/2020   Social History   Tobacco Use   Smoking status: Not on file   Smokeless tobacco: Not on file  Substance Use Topics   Alcohol use: Not on file   No family history on file.  Procedures:  N/A  Significant Diagnostic Tests:   MRI  L-spine (9/24): Reactive marrow edema @ L5-S1 interspace. Hardware loosening vs infection.   Micro Data:   9/24: 4/4 bottles with Strep species   Antimicrobials:  Cefepime/vancomycin (9/24>>)   Interim history/subjective:   No overnight events. This AM, Ms. Tonya Baker states she continues to have pain in her lower back and left hip that radiates down her left leg. This is [name] as of 1 week ago and started suddenly. She denies any fever. No other acute complaints at this time.   Objective   BP (!) 78/51 (BP Location: Right Arm)   Pulse 76   Temp 98.5 F (36.9 C) (Oral)   Resp 17   Ht 5\' 4"  (1.626 m)   Wt 92.2 kg   SpO2 93%   BMI 34.89 kg/m     Filed Weights   12/28/20 1821 12/29/20 0650 12/30/20 0334  Weight: 108.9 kg 93.9 kg 92.2 kg    Intake/Output Summary (Last 24 hours) at 12/30/2020 01/01/2021 Last data filed at 12/30/2020 0900 Gross per 24 hour  Intake 1051.74 ml  Output 1725 ml  Net -673.26 ml    Examination: General appearance: 69 y.o., female, NAD, conversant  Eyes: anicteric sclerae, moist conjunctivae HENT: NCAT; dry mm Lungs: Clear to auscultation throughout with no increased respiratory effort CV: RRR, no murmurs, rubs, gallops Abdomen: Soft, non-tender; non-distended, BS present  Extremities: No peripheral edema, warm Skin: Normal temperature, turgor and texture; diffuse patchy erythema throughout  all extremities with some abrasions on left upper extremity.  No area that appears infected at this time. Neuro: Alert and oriented to person and place. Able to move all extremities.   Resolved Hospital Problem list   N/A  Assessment & Plan:   Streptococcus Bacteremia Hx of MRSA Osteomyelitis (2021) 4 out of 4 bottles growing undifferentiated streptococcal species; speciation is still pending.  Potential source is lumbar osteomyelitis given abnormal imaging with acute onset back pain.   - ID following; appreciate their recommendations - Continue Ceftriaxone and  Vancomycin  - Holding home suppressive Doxycycline  - Repeat blood cultures - Follow up previous cultures speciation results  - Follow up urine cultures  - NSG consult placed  - Tylenol, Flexeril, Tramadol for back pain  Acute on Chronic Hypotension On midodrine 10 mg TID at home. No evidence of shock at this time, however high risk for developing given bacteremia.   - Increase Midodrine to 20 mg TID - Continue Levophed PRN to maintain SBP > 90.  - Minimize sedating medications  - PT/mobilize  OSA - CPAP at night  Type 2 Diabetes Mellitus  Gastroparesis  Diabetic Neuropathy  - SSI, resistant - Add Levemir 18 units QD  - Continue home Reglan - Continue home Gabapentin   Hx of CAD s/p MI  - Continue home Aspirin and Atorvastatin   HFpEF  - No evidence of hypervolemia at this time - Holding home Lasix 40 mg daily   Best practice:  Diet: Carb modified  Pain/Anxiety/Delirium protocol (if indicated): N/A VAP protocol (if indicated): N/A DVT prophylaxis: heparin GI prophylaxis: Protonix Glucose control: sliding scale insulin Mobility/Activity: bedrest   Code Status: Full Code Family Communication:   no family at bedside Disposition: ICU   Dr. Verdene Lennert Internal Medicine PGY-3 12/30/2020, 11:35 AM

## 2020-12-30 NOTE — Progress Notes (Signed)
Inpatient Diabetes Program Recommendations  AACE/ADA: [place] Consensus Statement on Inpatient Glycemic Control (2015)  Target Ranges:  Prepandial:   less than 140 mg/dL      Peak postprandial:   less than 180 mg/dL (1-2 hours)      Critically ill patients:  140 - 180 mg/dL   Lab Results  Component Value Date   GLUCAP 201 (H) 12/30/2020   HGBA1C 7.8 (H) 12/28/2020    Review of Glycemic Control Results for Tonya Baker, Tonya Baker (MRN [id number]) as of 12/30/2020 13:38  Ref. Range 12/29/2020 07:25 12/29/2020 11:33 12/29/2020 15:20 12/29/2020 19:35 12/29/2020 23:39 12/30/2020 03:21 12/30/2020 07:42 12/30/2020 11:27  Glucose-Capillary Latest Ref Range: 70 - 99 mg/dL 956 (H)  Novlog 3 units 206 (H)  Novolog 7 units 227 (H)  Novolog 7 units 185 (H)  Novolog 4 units 192 (H)  Novolog 4 units 262 (H)  Novolog 11 units 155 (H)  Novolog 4 units 201 (H)  Novolog 7 uits   Diabetes history: DM 2 Outpatient Diabetes medications: Trulicity 0.75 mg QMonday, Novolog 0-12 units tid Current orders for Inpatient glycemic control:  Levemir 18 units Novolog 0-20 units Q4 hours  Inpatient Diabetes Program Recommendations:    Glucose trends increase after meal intake  -  Add Novolog 3 units tid meal coverage if eating >50% of meals  Thanks,  Christena Deem RN, MSN, BC-ADM Inpatient Diabetes Coordinator Team Pager [phone] (8a-5p)

## 2020-12-31 DIAGNOSIS — R7881 Bacteremia: Secondary | ICD-10-CM | POA: Diagnosis not present

## 2020-12-31 DIAGNOSIS — N1831 Chronic kidney disease, stage 3a: Secondary | ICD-10-CM | POA: Diagnosis not present

## 2020-12-31 DIAGNOSIS — M4626 Osteomyelitis of vertebra, lumbar region: Secondary | ICD-10-CM | POA: Diagnosis not present

## 2020-12-31 DIAGNOSIS — A419 Sepsis, unspecified organism: Secondary | ICD-10-CM | POA: Diagnosis not present

## 2020-12-31 DIAGNOSIS — G4733 Obstructive sleep apnea (adult) (pediatric): Secondary | ICD-10-CM | POA: Diagnosis not present

## 2020-12-31 LAB — CULTURE, BLOOD (SINGLE): Special Requests: ADEQUATE

## 2020-12-31 LAB — CBC WITH DIFFERENTIAL/PLATELET
Abs Immature Granulocytes: 0.14 10*3/uL — ABNORMAL HIGH (ref 0.00–0.07)
Basophils Absolute: 0.1 10*3/uL (ref 0.0–0.1)
Basophils Relative: 1 %
Eosinophils Absolute: 0.4 10*3/uL (ref 0.0–0.5)
Eosinophils Relative: 3 %
HCT: 37.3 % (ref 36.0–46.0)
Hemoglobin: 12.1 g/dL (ref 12.0–15.0)
Immature Granulocytes: 1 %
Lymphocytes Relative: 8 %
Lymphs Abs: 1.2 10*3/uL (ref 0.7–4.0)
MCH: 29.6 pg (ref 26.0–34.0)
MCHC: 32.4 g/dL (ref 30.0–36.0)
MCV: 91.2 fL (ref 80.0–100.0)
Monocytes Absolute: 1 10*3/uL (ref 0.1–1.0)
Monocytes Relative: 7 %
Neutro Abs: 11.2 10*3/uL — ABNORMAL HIGH (ref 1.7–7.7)
Neutrophils Relative %: 80 %
Platelets: 268 10*3/uL (ref 150–400)
RBC: 4.09 MIL/uL (ref 3.87–5.11)
RDW: 13.2 % (ref 11.5–15.5)
WBC: 13.9 10*3/uL — ABNORMAL HIGH (ref 4.0–10.5)
nRBC: 0 % (ref 0.0–0.2)

## 2020-12-31 LAB — CULTURE, BLOOD (ROUTINE X 2): Special Requests: ADEQUATE

## 2020-12-31 LAB — BASIC METABOLIC PANEL
Anion gap: 7 (ref 5–15)
BUN: 9 mg/dL (ref 8–23)
CO2: 28 mmol/L (ref 22–32)
Calcium: 8.7 mg/dL — ABNORMAL LOW (ref 8.9–10.3)
Chloride: 101 mmol/L (ref 98–111)
Creatinine, Ser: 0.9 mg/dL (ref 0.44–1.00)
GFR, Estimated: >60 mL/min (ref >60)
Glucose, Bld: 112 mg/dL — ABNORMAL HIGH (ref 70–99)
Potassium: 3.4 mmol/L — ABNORMAL LOW (ref 3.5–5.1)
Sodium: 136 mmol/L (ref 135–145)

## 2020-12-31 LAB — GLUCOSE, CAPILLARY
Glucose-Capillary: 134 mg/dL — ABNORMAL HIGH (ref 70–99)
Glucose-Capillary: 164 mg/dL — ABNORMAL HIGH (ref 70–99)
Glucose-Capillary: 143 mg/dL — ABNORMAL HIGH (ref 70–99)
Glucose-Capillary: 80 mg/dL (ref 70–99)
Glucose-Capillary: 191 mg/dL — ABNORMAL HIGH (ref 70–99)

## 2020-12-31 MED ORDER — DOXYCYCLINE HYCLATE 100 MG PO TABS
100.0000 mg | ORAL_TABLET | Freq: Two times a day (BID) | ORAL | Status: DC
  Administered 2020-12-31 – 2021-01-03 (×8): 100 mg via ORAL
  Filled 2020-12-31 (×8): qty 1

## 2020-12-31 MED ORDER — LACTATED RINGERS IV BOLUS
500.0000 mL | Freq: Once | INTRAVENOUS | Status: AC
  Administered 2020-12-31: 500 mL via INTRAVENOUS

## 2020-12-31 MED ORDER — PENICILLIN G POTASSIUM 20000000 UNITS IJ SOLR
12.0000 10*6.[IU] | Freq: Two times a day (BID) | INTRAVENOUS | Status: DC
  Administered 2020-12-31 – 2021-01-03 (×6): 12 10*6.[IU] via INTRAVENOUS
  Filled 2020-12-31 (×9): qty 12

## 2020-12-31 MED ORDER — POLYETHYLENE GLYCOL 3350 17 G PO PACK: 17.0000 g | PACK | Freq: Every day | ORAL | Status: DC

## 2020-12-31 MED ORDER — INSULIN DETEMIR 100 UNIT/ML ~~LOC~~ SOLN
22.0000 [IU] | Freq: Every day | SUBCUTANEOUS | Status: DC
  Administered 2021-01-01 – 2021-01-03 (×3): 22 [IU] via SUBCUTANEOUS
  Filled 2020-12-31 (×4): qty 0.22

## 2020-12-31 MED ORDER — DOCUSATE SODIUM 100 MG PO CAPS
100.0000 mg | ORAL_CAPSULE | Freq: Two times a day (BID) | ORAL | Status: DC
  Administered 2020-12-31 – 2021-01-03 (×5): 100 mg via ORAL
  Filled 2020-12-31 (×6): qty 1

## 2020-12-31 MED ORDER — MIDODRINE HCL 5 MG PO TABS
30.0000 mg | ORAL_TABLET | Freq: Three times a day (TID) | ORAL | Status: DC
  Administered 2020-12-31 – 2021-01-03 (×11): 30 mg via ORAL
  Filled 2020-12-31 (×11): qty 6

## 2020-12-31 NOTE — Progress Notes (Signed)
eLink Physician-Brief Progress Note Patient Name: Marlenne Ridge DOB: Oct 21, 1950 MRN: [id number]   Date of Service  12/31/2020  HPI/Events of Note  BP soft 82/45(58)  HR 73   -2660, no current fluids, received fluid bolus today for hypotension.  Echo on 9/25 with EF 60-65% Seen comfortable flat on bed  eICU Interventions  Ordered 500 cc LR bolus     Intervention Category Intermediate Interventions: Hypotension - evaluation and management  Rosalie Gums Bolivar Koranda 12/31/2020, 12:01 AM

## 2020-12-31 NOTE — Progress Notes (Signed)
[facility]  Date of Admission:  12/28/2020     Total days of antibiotics 4         ASSESSMENT:  Ms. Purtle continues to have back pain. Blood cultures growing pan-sensitive Streptococcus dysgalactiae. Previous records reviewed with previous cultures positive for MRSA and on suppression with doxycycline. Neurosurgery has recommended no surgical intervention. Will narrow antibiotics down to Penicillin G for Strep bacteremia and to doxycycline for suppression of previous MRSA infection. Her back remains a concern as potential for source of infection. Repeat blood cultures are without growth in <12 hours. ID will continue to follow.   PLAN:  Narrow antibiotics to Penicillin G and doxycycline.  Monitor repeat cultures for clearance of bacteremia. Continue pain management and supportive care per primary team.  ID will continue to follow.   Principal Problem:   Hypotension Active Problems:   Back pain   CKD (chronic kidney disease) stage 3, GFR 30-59 ml/min (HCC)   Spinal stenosis   Obstructive sleep apnea   Sepsis (HCC)   Type 2 diabetes mellitus with diabetic neuropathy, with long-term current use of insulin (HCC)   Volume overload   Hardware complicating wound infection (HCC)   Vertebral osteomyelitis (HCC)    aspirin  81 mg Oral q morning   atorvastatin  40 mg Oral QHS   Chlorhexidine Gluconate Cloth  6 each Topical Daily   doxycycline  100 mg Oral Q12H   gabapentin  100 mg Oral TID   heparin  5,000 Units Subcutaneous Q8H   hydrocerin  1 application Topical Daily   insulin aspart  0-20 Units Subcutaneous Q4H   insulin detemir  18 Units Subcutaneous Daily   midodrine  30 mg Oral TID with meals   multivitamin with minerals  1 tablet Oral q morning   pantoprazole  40 mg Oral QHS   sertraline  12.5 mg Oral Daily    SUBJECTIVE:  Afebrile overnight with no acute events. Continues to have back pain with radiculopathy on the left. Has concerns about  ability to care for herself.   Allergies  Allergen Reactions   Cortisone Other (See Comments)    Unknown reaction   Hydrocortisone Other (See Comments)    Unknown reaction   Latex Other (See Comments)    Unknown reaction     Review of Systems: Review of Systems  Constitutional:  Negative for chills, fever and weight loss.  Respiratory:  Negative for cough, shortness of breath and wheezing.   Cardiovascular:  Negative for chest pain and leg swelling.  Gastrointestinal:  Negative for abdominal pain, constipation, diarrhea, nausea and vomiting.  Musculoskeletal:  Positive for back pain.  Skin:  Negative for rash.     OBJECTIVE: Vitals:   12/31/20 1015 12/31/20 1030 12/31/20 1045 12/31/20 1103  BP: 122/63 123/65 122/68   Pulse: 72 72 75   Resp: 18 19 20    Temp:    98.1 F (36.7 C)  TempSrc:    Oral  SpO2: (!) 87% 96% 93%   Weight:      Height:       Body mass index is 34.93 kg/m.  Physical Exam Constitutional:      General: She is not in acute distress.    Appearance: She is well-developed.  Cardiovascular:     Rate and Rhythm: Normal rate and regular rhythm.     Heart sounds: Normal heart sounds.  Pulmonary:     Effort: Pulmonary effort is normal.  Breath sounds: Normal breath sounds.  Skin:    General: Skin is warm and dry.  Neurological:     Mental Status: She is alert and oriented to person, place, and time.  Psychiatric:        Mood and Affect: Mood normal.    Lab Results Lab Results  Component Value Date   WBC 9.8 12/30/2020   HGB 10.6 (L) 12/30/2020   HCT 32.8 (L) 12/30/2020   MCV 91.9 12/30/2020   PLT 187 12/30/2020    Lab Results  Component Value Date   CREATININE 1.22 (H) 12/30/2020   BUN 15 12/30/2020   NA 135 12/30/2020   K 3.0 (L) 12/30/2020   CL 99 12/30/2020   CO2 26 12/30/2020    Lab Results  Component Value Date   ALT 12 12/30/2020   AST 15 12/30/2020   ALKPHOS 129 (H) 12/30/2020   BILITOT 0.8 12/30/2020      Microbiology: Recent Results (from the past 240 hour(s))  Blood culture (routine single)     Status: Abnormal   Collection Time: 12/28/20  6:00 PM   Specimen: BLOOD  Result Value Ref Range Status   Specimen Description   Final    BLOOD RIGHT ANTECUBITAL Performed at [facility], [address], [place], [place] [address]    Special Requests   Final    BOTTLES DRAWN AEROBIC AND ANAEROBIC Blood Culture adequate volume Performed at [facility], [address] [place], [place] [address]    Culture  Setup Time   Final    GRAM POSITIVE COCCI IN CHAINS IN PAIRS IN BOTH AEROBIC AND ANAEROBIC BOTTLES CRITICAL RESULT CALLED TO, READ BACK BY AND VERIFIED WITH: J,MILLEN PHARMD @1305  12/29/20 EB Performed at [facility], [address] [place], [place]    Culture STREPTOCOCCUS DYSGALACTIAE (A)  Final   Report Status 12/31/2020 FINAL  Final   Organism ID, Bacteria STREPTOCOCCUS DYSGALACTIAE  Final      Susceptibility   Streptococcus dysgalactiae - MIC*    PENICILLIN <=0.06 SENSITIVE Sensitive     CEFTRIAXONE <=0.12 SENSITIVE Sensitive     ERYTHROMYCIN <=0.12 SENSITIVE Sensitive     LEVOFLOXACIN 0.5 SENSITIVE Sensitive     VANCOMYCIN 0.5 SENSITIVE Sensitive     * STREPTOCOCCUS DYSGALACTIAE  Blood Culture ID Panel (Reflexed)     Status: Abnormal   Collection Time: 12/28/20  6:00 PM  Result Value Ref Range Status   Enterococcus faecalis NOT DETECTED NOT DETECTED Final   Enterococcus Faecium NOT DETECTED NOT DETECTED Final   Listeria monocytogenes NOT DETECTED NOT DETECTED Final   Staphylococcus species NOT DETECTED NOT DETECTED Final   Staphylococcus aureus (BCID) NOT DETECTED NOT DETECTED Final   Staphylococcus epidermidis NOT DETECTED NOT DETECTED Final   Staphylococcus lugdunensis NOT DETECTED NOT DETECTED Final   Streptococcus species DETECTED (A) NOT DETECTED Final    Comment: Not Enterococcus species, Streptococcus  agalactiae, Streptococcus pyogenes, or Streptococcus pneumoniae. CRITICAL RESULT CALLED TO, READ BACK BY AND VERIFIED WITH: J,MILLEN PHARMD @1305  12/29/20 EB    Streptococcus agalactiae NOT DETECTED NOT DETECTED Final   Streptococcus pneumoniae NOT DETECTED NOT DETECTED Final   Streptococcus pyogenes NOT DETECTED NOT DETECTED Final   A.calcoaceticus-baumannii NOT DETECTED NOT DETECTED Final   Bacteroides fragilis NOT DETECTED NOT DETECTED Final   Enterobacterales NOT DETECTED NOT DETECTED Final   Enterobacter cloacae complex NOT DETECTED NOT DETECTED Final   Escherichia coli NOT DETECTED NOT DETECTED Final   Klebsiella  aerogenes NOT DETECTED NOT DETECTED Final   Klebsiella oxytoca NOT DETECTED NOT DETECTED Final   Klebsiella pneumoniae NOT DETECTED NOT DETECTED Final   Proteus species NOT DETECTED NOT DETECTED Final   Salmonella species NOT DETECTED NOT DETECTED Final   Serratia marcescens NOT DETECTED NOT DETECTED Final   Haemophilus influenzae NOT DETECTED NOT DETECTED Final   Neisseria meningitidis NOT DETECTED NOT DETECTED Final   Pseudomonas aeruginosa NOT DETECTED NOT DETECTED Final   Stenotrophomonas maltophilia NOT DETECTED NOT DETECTED Final   Candida albicans NOT DETECTED NOT DETECTED Final   Candida auris NOT DETECTED NOT DETECTED Final   Candida glabrata NOT DETECTED NOT DETECTED Final   Candida krusei NOT DETECTED NOT DETECTED Final   Candida parapsilosis NOT DETECTED NOT DETECTED Final   Candida tropicalis NOT DETECTED NOT DETECTED Final   Cryptococcus neoformans/gattii NOT DETECTED NOT DETECTED Final    Comment: Performed at [facility], [address], [place], [place] [address]  Resp Panel by RT-PCR (Flu A&B, Covid) Nasopharyngeal Swab     Status: None   Collection Time: 12/28/20  6:08 PM   Specimen: Nasopharyngeal Swab; Nasopharyngeal(NP) swabs in vial transport medium  Result Value Ref Range Status   SARS Coronavirus 2 by RT PCR NEGATIVE NEGATIVE Final     Comment: (NOTE) SARS-CoV-2 target nucleic acids are NOT DETECTED.  The SARS-CoV-2 RNA is generally detectable in upper respiratory specimens during the acute phase of infection. The lowest concentration of SARS-CoV-2 viral copies this assay can detect is 138 copies/mL. A negative result does not preclude SARS-Cov-2 infection and should not be used as the sole basis for treatment or other patient management decisions. A negative result may occur with  improper specimen collection/handling, submission of specimen other than nasopharyngeal swab, presence of viral mutation(s) within the areas targeted by this assay, and inadequate number of viral copies(<138 copies/mL). A negative result must be combined with clinical observations, patient history, and epidemiological information. The expected result is Negative.  Fact Sheet for Patients:  BloggerCourse.com  Fact Sheet for Healthcare Providers:  SeriousBroker.it  This test is no t yet approved or cleared by the [place] FDA and  has been authorized for detection and/or diagnosis of SARS-CoV-2 by FDA under an Emergency Use Authorization (EUA). This EUA will remain  in effect (meaning this test can be used) for the duration of the COVID-19 declaration under Section 564(b)(1) of the Act, 21 U.S.C.section 360bbb-3(b)(1), unless the authorization is terminated  or revoked sooner.       Influenza A by PCR NEGATIVE NEGATIVE Final   Influenza B by PCR NEGATIVE NEGATIVE Final    Comment: (NOTE) The Xpert Xpress SARS-CoV-2/FLU/RSV plus assay is intended as an aid in the diagnosis of influenza from Nasopharyngeal swab specimens and should not be used as a sole basis for treatment. Nasal washings and aspirates are unacceptable for Xpert Xpress SARS-CoV-2/FLU/RSV testing.  Fact Sheet for Patients: BloggerCourse.com  Fact Sheet for Healthcare  Providers: SeriousBroker.it  This test is not yet approved or cleared by the [place] FDA and has been authorized for detection and/or diagnosis of SARS-CoV-2 by FDA under an Emergency Use Authorization (EUA). This EUA will remain in effect (meaning this test can be used) for the duration of the COVID-19 declaration under Section 564(b)(1) of the Act, 21 U.S.C. section 360bbb-3(b)(1), unless the authorization is terminated or revoked.  Performed at [facility], [address] [place], [place] [address]   Culture, blood (routine x 2)  Status: Abnormal   Collection Time: 12/28/20 10:30 PM   Specimen: BLOOD  Result Value Ref Range Status   Specimen Description   Final    BLOOD RIGHT ANTECUBITAL Performed at [facility], [address] [place], [place] [address]    Special Requests   Final    BOTTLES DRAWN AEROBIC AND ANAEROBIC Blood Culture adequate volume Performed at [facility], [address] [place], [place] [address]    Culture  Setup Time   Final    GRAM POSITIVE COCCI IN BOTH AEROBIC AND ANAEROBIC BOTTLES CRITICAL VALUE NOTED.  VALUE IS CONSISTENT WITH PREVIOUSLY REPORTED AND CALLED VALUE.    Culture (A)  Final    STREPTOCOCCUS DYSGALACTIAE SUSCEPTIBILITIES PERFORMED ON PREVIOUS CULTURE WITHIN THE LAST 5 DAYS. Performed at [facility], [address] [place], [place] [address]    Report Status 12/31/2020 FINAL  Final  MRSA Next Gen by PCR, Nasal     Status: None   Collection Time: 12/29/20 12:20 AM   Specimen: Nasal Mucosa; Nasal Swab  Result Value Ref Range Status   MRSA by PCR Next Gen NOT DETECTED NOT DETECTED Final    Comment: (NOTE) The GeneXpert MRSA Assay (FDA approved for NASAL specimens only), is one component of a comprehensive MRSA colonization surveillance program. It is not intended to diagnose MRSA infection nor to guide or monitor treatment for MRSA  infections. Test performance is not FDA approved in patients less than 82 years old. Performed at [facility], [address], [place], [place] [address]   Culture, blood (routine x 2)     Status: None (Preliminary result)   Collection Time: 12/30/20 11:21 AM   Specimen: BLOOD RIGHT HAND  Result Value Ref Range Status   Specimen Description BLOOD RIGHT HAND  Final   Special Requests   Final    BOTTLES DRAWN AEROBIC AND ANAEROBIC Blood Culture adequate volume   Culture   Final    NO GROWTH < 12 HOURS Performed at [facility], [address], [place], [place] [address]    Report Status PENDING  Incomplete  Culture, blood (routine x 2)     Status: None (Preliminary result)   Collection Time: 12/30/20 11:21 AM   Specimen: BLOOD LEFT HAND  Result Value Ref Range Status   Specimen Description BLOOD LEFT HAND  Final   Special Requests   Final    AEROBIC BOTTLE ONLY Blood Culture results may not be optimal due to an inadequate volume of blood received in culture bottles   Culture   Final    NO GROWTH < 12 HOURS Performed at [facility], [address] [place], [place] [address]    Report Status PENDING  Incomplete     Marcos Eke, NP [facility]  12/31/2020  11:10 AM

## 2020-12-31 NOTE — Progress Notes (Signed)
Initial Nutrition Assessment  DOCUMENTATION CODES:   Not applicable  INTERVENTION:   Magic cup TID with meals, each supplement provides 290 kcal and 9 grams of protein. MVI with minerals daily.  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 PMH includes OSA, DM-2, back surgery March 2022, diabetic autonomic neuropathy, gastroparesis.  Patient reports that she has been eating poorly for the past week due to decreased appetite associated with back pain. She is able to sit up to eat, but cannot eat much d/t pain. She requested that RD not perform nutrition focused physical exam because moving around makes the pain worse. She does not like milky supplements. Declines all liquid PO supplements, but agreed to receive magic cups with meals to increase protein and calorie intake.   Currently on a Carbohydrate modified diet. Meal intakes: 20-30% Per RN, patient did not eat breakfast today.  No weight history available for review.  Patient reports usual weight 255 lbs 1 year ago. Current weight 203 lbs. 20.3% weight loss within one year is significant.   NUTRITION - FOCUSED PHYSICAL EXAM:  Deferred per patient request  Diet Order:   Diet Order             Diet Carb Modified Fluid consistency: Thin; Room service appropriate? Yes  Diet effective now                   EDUCATION NEEDS:   Not appropriate for education at this time  Skin:  Skin Assessment: Reviewed RN Assessment  Last BM:  9/25  Height:   Ht Readings from Last 1 Encounters:  12/28/20 5\' 4"   (1.626 m)    Weight:   Wt Readings from Last 1 Encounters:  12/31/20 92.3 kg    BMI:  Body mass index is 34.93 kg/m.  Estimated Nutritional Needs:   Kcal:  1900-2100  Protein:  130-150 gm  Fluid:  >/= 2 L    01/02/21, RD, LDN, CNSC Please refer to Amion for contact information.

## 2020-12-31 NOTE — Progress Notes (Signed)
NAME:  Tonya Baker MRN:  [id number] DOB:  [birth year] LOS: 3 ADMISSION DATE:  12/28/2020 DATE OF SERVICE:  12/28/2020 CHIEF COMPLAINT:  back pain, hypotension  HISTORY & PHYSICAL  History of Present Illness  This 70 y.o. female nursing home reisdent presented to the [facility] Emergency Department via EMS with complaints of back pain. She apparently recently moved to [facility] from [place].  She reports that she had back surgery in March 2021 that was complication with posoperative infection, requiring a 2nd and 3rd surgery in the weeks following the original surgery.  She developed Vandenbos onset low back pain with pain radiating to the left hip about 2 days ago.  No trauma.  In the ER, the patient presented with a normal blood pressure.  She was given a Percocet, after which her blood pressure started trending downward.  As the patient presented with fever, the ER staff interpreted this to mean that the patient was progressing to septic shock, and the patient got 1 L IV fluid bolus and started a 2nd L, at which time norepinephrine infusion was also ordered by the ER provider.  PCCM asked to admit.  Lactate 1.1-->1.8.  Of note, the patient takes midodrine 10 mg PO TID, which was not administered in the ER.  After administration of the midodrine dose, the patient has maintained MAP > 65, albeit with labile SBP ranging from 78-108 with a narrow pulse pressure  Past Medical/Surgical/Social/Family History   Past Medical History:  Diagnosis Date   Back pain    Obstructive sleep apnea    Type 2 diabetes mellitus with diabetic autonomic neuropathy, with long-term current use of insulin [facility])    Past Surgical History:  Procedure Laterality Date   BACK SURGERY N/A 06/2020   Social History   Tobacco Use   Smoking status: Not on file   Smokeless tobacco: Not on file  Substance Use Topics   Alcohol use: Not on file   No family history on file.  Procedures:  N/A  Significant Diagnostic Tests:   MRI  L-spine (9/24): Reactive marrow edema @ L5-S1 interspace. Hardware loosening vs infection.   Micro Data:   9/24: 4/4 bottles with Strep Dysgalactiae 9/26: NGTD @ 12 hours   Antimicrobials:   Cefepime 9/24 - 9/25  Ceftriaxone 9/25 - 9/26  Vancomycin 9/24 - 9/27   Penicillin 9/27 >>  Doxycycline 9/27 >>   Interim history/subjective:   No overnight events. This AM, [name] reports her back is still severely hurting and making it difficult for her to rest. She denies any skin wounds or urinary symptoms.   Objective   BP 119/62   Pulse 74   Temp 98.1 F (36.7 C) (Oral)   Resp (!) 22   Ht 5\' 4"  (1.626 m)   Wt 92.3 kg   SpO2 97%   BMI 34.93 kg/m     Filed Weights   12/29/20 0650 12/30/20 0334 12/31/20 0500  Weight: 93.9 kg 92.2 kg 92.3 kg    Intake/Output Summary (Last 24 hours) at 12/31/2020 1202 Last data filed at 12/31/2020 1000 Gross per 24 hour  Intake 1855.21 ml  Output 1600 ml  Net 255.21 ml    Examination: General appearance: 70 y.o., female, NAD, conversant  Eyes: anicteric sclerae, moist conjunctivae HENT: NCAT; dry mm Lungs: Clear to auscultation throughout with no increased respiratory effort CV: RRR, no murmurs, rubs, gallops Abdomen: Soft, non-tender; non-distended, BS present  Extremities: No peripheral edema, warm Skin: Normal temperature, turgor and  texture; diffuse patchy erythema throughout all extremities (patient states it is a sunburn). No area that appears infected at this time. Neuro: Alert and oriented to person and place. Able to move all extremities.   Resolved Hospital Problem list   N/A  Assessment & Plan:   Streptococcus Dysgalactiae Bacteremia Hx of MRSA Osteomyelitis (2021) Source identified at this time. Patient continues to have severe back pain concerning for hardware source. NSG recommending IR sample if high clinical suspicion remains. No skin or urinary s/s at this time.   - ID following; appreciate their recommendations -  Discontinue Ceftriaxone and Vancomycin  - Start Penicillin G  - Restart home suppressive Doxycycline  - Repeat blood cultures pending - NGTD - Follow up urine cultures  - Tylenol, Flexeril, Tramadol for back pain  Shock Chronic Hypotension On midodrine 10 mg TID at home.   - Increase Midodrine to 30 mg TID - Continue Levophed PRN to maintain SBP > 90. Wean as tolerated - Minimize sedating medications  - PT/mobilize  OSA - CPAP at night  AKI Hx of CKD Stage 3 Creatinine improving.   - Trend renal indexes  - Strict in/out   Type 2 Diabetes Mellitus  Gastroparesis  Diabetic Neuropathy  - SSI, resistant - Increase Levemir to 22 units QD  - Continue home Reglan - Continue home Gabapentin   Hx of CAD s/p MI  - Continue home Aspirin and Atorvastatin   HFpEF  - No evidence of hypervolemia at this time - Holding home Lasix 40 mg daily   Best practice:  Diet: Carb modified  Pain/Anxiety/Delirium protocol (if indicated): N/A VAP protocol (if indicated): N/A DVT prophylaxis: Prophylactic Heparin GI prophylaxis: home Protonix  Glucose control: SSI + Levemir  Mobility/Activity: bedrest   Code Status: Full Code Disposition: ICU   Dr. Verdene Lennert Internal Medicine PGY-3 12/31/2020, 12:02 PM

## 2020-12-31 NOTE — Progress Notes (Signed)
eLink Physician-Brief Progress Note Patient Name: Tonya Baker DOB: [birth year] MRN: [id number]   Date of Service  12/31/2020  HPI/Events of Note  Patient c/o 0/10 surgical back pain, has taken tylenol po and its not giving her relief,   PTA med included oxycodone 10mg  every 8 hours  eICU Interventions  Seen asleep at this time. Given recent hypotensive episode with no indication for stronger pain medication at this time, would hold off for now     Intervention Category Minor Interventions: Routine modifications to care plan (e.g. PRN medications for pain, fever)  T Treyshaun Keatts 12/31/2020, 6:00 AM

## 2021-01-01 DIAGNOSIS — B955 Unspecified streptococcus as the cause of diseases classified elsewhere: Secondary | ICD-10-CM | POA: Diagnosis not present

## 2021-01-01 DIAGNOSIS — M4620 Osteomyelitis of vertebra, site unspecified: Secondary | ICD-10-CM | POA: Diagnosis not present

## 2021-01-01 DIAGNOSIS — M4626 Osteomyelitis of vertebra, lumbar region: Secondary | ICD-10-CM | POA: Diagnosis not present

## 2021-01-01 DIAGNOSIS — A419 Sepsis, unspecified organism: Secondary | ICD-10-CM | POA: Diagnosis not present

## 2021-01-01 DIAGNOSIS — N1831 Chronic kidney disease, stage 3a: Secondary | ICD-10-CM | POA: Diagnosis not present

## 2021-01-01 DIAGNOSIS — R7881 Bacteremia: Secondary | ICD-10-CM | POA: Diagnosis not present

## 2021-01-01 LAB — CBC WITH DIFFERENTIAL/PLATELET
Abs Immature Granulocytes: 0.07 10*3/uL (ref 0.00–0.07)
Basophils Absolute: 0.1 10*3/uL (ref 0.0–0.1)
Basophils Relative: 1 %
Eosinophils Absolute: 0.4 10*3/uL (ref 0.0–0.5)
Eosinophils Relative: 4 %
HCT: 31.8 % — ABNORMAL LOW (ref 36.0–46.0)
Hemoglobin: 10.4 g/dL — ABNORMAL LOW (ref 12.0–15.0)
Immature Granulocytes: 1 %
Lymphocytes Relative: 18 %
Lymphs Abs: 1.8 10*3/uL (ref 0.7–4.0)
MCH: 29.8 pg (ref 26.0–34.0)
MCHC: 32.7 g/dL (ref 30.0–36.0)
MCV: 91.1 fL (ref 80.0–100.0)
Monocytes Absolute: 0.8 10*3/uL (ref 0.1–1.0)
Monocytes Relative: 8 %
Neutro Abs: 6.9 10*3/uL (ref 1.7–7.7)
Neutrophils Relative %: 68 %
Platelets: 222 10*3/uL (ref 150–400)
RBC: 3.49 MIL/uL — ABNORMAL LOW (ref 3.87–5.11)
RDW: 13.2 % (ref 11.5–15.5)
WBC: 10.1 10*3/uL (ref 4.0–10.5)
nRBC: 0 % (ref 0.0–0.2)

## 2021-01-01 LAB — BASIC METABOLIC PANEL
Anion gap: 7 (ref 5–15)
BUN: 9 mg/dL (ref 8–23)
CO2: 24 mmol/L (ref 22–32)
Calcium: 8.2 mg/dL — ABNORMAL LOW (ref 8.9–10.3)
Chloride: 102 mmol/L (ref 98–111)
Creatinine, Ser: 0.83 mg/dL (ref 0.44–1.00)
GFR, Estimated: >60 mL/min (ref >60)
Glucose, Bld: 163 mg/dL — ABNORMAL HIGH (ref 70–99)
Potassium: 3.4 mmol/L — ABNORMAL LOW (ref 3.5–5.1)
Sodium: 133 mmol/L — ABNORMAL LOW (ref 135–145)

## 2021-01-01 LAB — GLUCOSE, CAPILLARY
Glucose-Capillary: 116 mg/dL — ABNORMAL HIGH (ref 70–99)
Glucose-Capillary: 140 mg/dL — ABNORMAL HIGH (ref 70–99)
Glucose-Capillary: 142 mg/dL — ABNORMAL HIGH (ref 70–99)
Glucose-Capillary: 175 mg/dL — ABNORMAL HIGH (ref 70–99)
Glucose-Capillary: 180 mg/dL — ABNORMAL HIGH (ref 70–99)
Glucose-Capillary: 185 mg/dL — ABNORMAL HIGH (ref 70–99)
Glucose-Capillary: 185 mg/dL — ABNORMAL HIGH (ref 70–99)
Glucose-Capillary: 98 mg/dL (ref 70–99)
Glucose-Capillary: 99 mg/dL (ref 70–99)

## 2021-01-01 MED ORDER — INSULIN ASPART 100 UNIT/ML IJ SOLN
0.0000 [IU] | Freq: Three times a day (TID) | INTRAMUSCULAR | Status: DC
Start: 2021-01-01 — End: 2021-01-04
  Administered 2021-01-02: 11 [IU] via SUBCUTANEOUS
  Administered 2021-01-02: 4 [IU] via SUBCUTANEOUS
  Administered 2021-01-02 – 2021-01-03 (×2): 7 [IU] via SUBCUTANEOUS
  Administered 2021-01-03: 11 [IU] via SUBCUTANEOUS
  Administered 2021-01-03: 4 [IU] via SUBCUTANEOUS
  Administered 2021-01-03: 11 [IU] via SUBCUTANEOUS

## 2021-01-01 MED ORDER — GABAPENTIN 300 MG PO CAPS
600.0000 mg | ORAL_CAPSULE | Freq: Three times a day (TID) | ORAL | Status: DC
  Administered 2021-01-01 – 2021-01-03 (×8): 600 mg via ORAL
  Filled 2021-01-01 (×8): qty 2

## 2021-01-01 MED ORDER — LACTATED RINGERS IV BOLUS
500.0000 mL | Freq: Once | INTRAVENOUS | Status: AC
  Administered 2021-01-01: 500 mL via INTRAVENOUS

## 2021-01-01 MED ORDER — ENOXAPARIN SODIUM 40 MG/0.4ML IJ SOSY
40.0000 mg | PREFILLED_SYRINGE | INTRAMUSCULAR | Status: DC
  Administered 2021-01-01 – 2021-01-03 (×3): 40 mg via SUBCUTANEOUS
  Filled 2021-01-01 (×3): qty 0.4

## 2021-01-01 MED ORDER — POTASSIUM CHLORIDE 20 MEQ PO PACK
40.0000 meq | PACK | Freq: Once | ORAL | Status: AC
  Administered 2021-01-01: 40 meq via ORAL
  Filled 2021-01-01: qty 2

## 2021-01-01 MED ORDER — POTASSIUM CHLORIDE CRYS ER 20 MEQ PO TBCR
40.0000 meq | EXTENDED_RELEASE_TABLET | Freq: Once | ORAL | Status: DC
  Filled 2021-01-01: qty 2

## 2021-01-01 NOTE — Progress Notes (Signed)
eLink Physician-Brief Progress Note Patient Name: Tonya Baker DOB: 19-Jun-1950 MRN: [id number]   Date of Service  01/01/2021  HPI/Events of Note  Notified by RN that she had to restart levophed due to hypotension.  Pt on 27mcg/min. BP in the 120s.  eICU Interventions  Give LR 500cc bolus and try to titrate levophed to off.     Intervention Category Intermediate Interventions: Hypotension - evaluation and management  Larinda Buttery 01/01/2021, 1:24 AM

## 2021-01-01 NOTE — Progress Notes (Signed)
[name] ADULT ICU REPLACEMENT PROTOCOL   The patient does apply for the [facility] Adult ICU Electrolyte Replacment Protocol based on the criteria listed below:   1.Exclusion criteria: TCTS patients, ECMO patients and Hypothermia Protocol, and   Dialysis patients 2. Is GFR >/= 30 ml/min? Yes.    Patient's GFR today is >60 3. Is SCr </= 2? No. Patient's SCr is 0.83 mg/dL 4. Did SCr increase >/= 0.5 in 24 hours? No. 5.Pt's weight >40kg  Yes.   6. Abnormal electrolyte(s): K+ 3.4  7. Electrolytes replaced per protocol 8.  Call MD STAT for K+ </= 2.5, Phos </= 1, or Mag </= 1 Physician:  n/a  Tonya Baker 01/01/2021 4:21 AM

## 2021-01-01 NOTE — Progress Notes (Signed)
NAME:  Tonya Baker MRN:  [id number] DOB:  Feb 22, 1951 LOS: 4 ADMISSION DATE:  12/28/2020 DATE OF SERVICE:  12/28/2020 CHIEF COMPLAINT:  back pain, hypotension  HISTORY & PHYSICAL  History of Present Illness  This 70 y.o. female nursing home reisdent presented to the [facility] Emergency Department via EMS with complaints of back pain. She apparently recently moved to [facility] from [place].  She reports that she had back surgery in March 2021 that was complication with posoperative infection, requiring a 2nd and 3rd surgery in the weeks following the original surgery.  She developed Fouch onset low back pain with pain radiating to the left hip about 2 days ago.  No trauma.  In the ER, the patient presented with a normal blood pressure.  She was given a Percocet, after which her blood pressure started trending downward.  As the patient presented with fever, the ER staff interpreted this to mean that the patient was progressing to septic shock, and the patient got 1 L IV fluid bolus and started a 2nd L, at which time norepinephrine infusion was also ordered by the ER provider.  PCCM asked to admit.  Lactate 1.1-->1.8.  Of note, the patient takes midodrine 10 mg PO TID, which was not administered in the ER.  After administration of the midodrine dose, the patient has maintained MAP > 65, albeit with labile SBP ranging from 78-108 with a narrow pulse pressure  Past Medical/Surgical/Social/Family History   Past Medical History:  Diagnosis Date   Back pain    Obstructive sleep apnea    Type 2 diabetes mellitus with diabetic autonomic neuropathy, with long-term current use of insulin [facility])    Past Surgical History:  Procedure Laterality Date   BACK SURGERY N/A 06/2020   Social History   Tobacco Use   Smoking status: Not on file   Smokeless tobacco: Not on file  Substance Use Topics   Alcohol use: Not on file   No family history on file.  Procedures:  N/A  Significant Diagnostic Tests:   MRI  L-spine (9/24): Reactive marrow edema @ L5-S1 interspace. Hardware loosening vs infection.   Micro Data:   9/24: 4/4 bottles with Strep Dysgalactiae 9/26: NGTD @ 12 hours   Antimicrobials:   Cefepime 9/24 - 9/25  Ceftriaxone 9/25 - 9/26  Vancomycin 9/24 - 9/27   Penicillin 9/27 >>  Doxycycline 9/27 >>   Interim history/subjective:   Overnight, patient's blood pressure decreased to 77/39 and Levophed was restarted for approximately 3-4 hours only.   This AM, [name] states she continues to have low back pain with radiation to left hip. Otherwise, she denies any acute complaints.   Objective   BP (!) 99/59 (BP Location: Right Arm)   Pulse 82   Temp 97.6 F (36.4 C) (Oral)   Resp 17   Ht 5\' 4"  (1.626 m)   Wt 92.1 kg   SpO2 96%   BMI 34.85 kg/m     Filed Weights   12/30/20 0334 12/31/20 0500 01/01/21 0500  Weight: 92.2 kg 92.3 kg 92.1 kg    Intake/Output Summary (Last 24 hours) at 01/01/2021 1003 Last data filed at 01/01/2021 0700 Gross per 24 hour  Intake 1742.54 ml  Output 2950 ml  Net -1207.46 ml    Examination: General appearance: 70 y.o., female, NAD, conversant  Eyes: anicteric sclerae, moist conjunctivae HENT: NCAT; dry mm Lungs: Clear to auscultation throughout with no increased respiratory effort CV: RRR, no murmurs, rubs, gallops Abdomen: Soft,  non-tender; non-distended, BS present  Extremities: No peripheral edema, warm Skin: Normal temperature, turgor and texture. Neuro: Alert and oriented to person and place. Able to move all extremities.   Resolved Hospital Problem list   N/A  Assessment & Plan:   Streptococcus Dysgalactiae Bacteremia Hx of MRSA Osteomyelitis (2021) Source un-identified at this time. Patient continues to have severe back pain concerning for hardware source however NSG has low suspicion.   - ID following; appreciate their recommendations - Continue Penicillin G  - Continue home suppressive Doxycycline  - Continue to follow  blood cultures - NGTD - Tylenol, Flexeril, Tramadol for back pain  Shock: Resolved Chronic Hypotension On midodrine 10 mg TID at home. Continues to intermittently require Levophed, however low suspicion that she remains in a shock state. I suspect her recurrent hypotension during the last 24-48 hours is her chronic hypotension with exacerbation in the setting of poor PO intake and sedating medications.   - Continue Midodrine to 30 mg TID - Discontinue Levophed - IVF as needed to maintain hydration - Encourage PO intake - Dietician consulted - Minimize sedating medications  - PT/mobilize  OSA - CPAP at night  AKI: Resolved Hx of CKD Stage 3 - Trend renal indexes  - Strict in/out   Type 2 Diabetes Mellitus  Gastroparesis  Diabetic Neuropathy  - SSI, resistant - Continue Levemir to 22 units QD  - Continue home Reglan - Continue home Gabapentin   Hx of CAD s/p MI  - Continue home Aspirin and Atorvastatin   HFpEF  - Holding home Lasix 40 mg daily   Best practice:  Diet: Carb modified  Pain/Anxiety/Delirium protocol (if indicated): N/A VAP protocol (if indicated): N/A DVT prophylaxis: Prophylactic Heparin GI prophylaxis: home Protonix  Glucose control: SSI + Levemir  Mobility/Activity: bedrest   Code Status: Full Code  Disposition: Stable for transfer to Progressive Floor  Dr. Verdene Lennert Internal Medicine PGY-3 01/01/2021, 10:03 AM

## 2021-01-01 NOTE — Progress Notes (Signed)
eLink Physician-Brief Progress Note Patient Name: Tonya Baker DOB: 1950-07-21 MRN: [id number]   Date of Service  01/01/2021  HPI/Events of Note  Nursing request for AM lab orders.   eICU Interventions  Plan: CBD with Diff/Platelets and BMP at 5 AM.     Intervention Category Major Interventions: Other:  Lenell Antu 01/01/2021, 8:27 PM

## 2021-01-01 NOTE — Progress Notes (Signed)
[facility]   Reason for visit: Follow up on bacteremia  Interval History: repeat blood cultures remain ngtd.  Strep dysgalactiae pansensitive.   Day 5 total antibiotics  Physical Exam: Constitutional:  Vitals:   01/01/21 1100 01/01/21 1209  BP: 127/65   Pulse: 70 65  Resp: 15 14  Temp:  98.1 F (36.7 C)  SpO2: 96% 96%   patient appears in NAD Respiratory: Normal respiratory effort; CTA B Cardiovascular: RRR GI: soft, nt, nd  Review of Systems: Constitutional: negative for fevers and chills Gastrointestinal: negative for nausea and diarrhea  Lab Results  Component Value Date   WBC 10.1 01/01/2021   HGB 10.4 (L) 01/01/2021   HCT 31.8 (L) 01/01/2021   MCV 91.1 01/01/2021   PLT 222 01/01/2021    Lab Results  Component Value Date   CREATININE 0.83 01/01/2021   BUN 9 01/01/2021   NA 133 (L) 01/01/2021   K 3.4 (L) 01/01/2021   CL 102 01/01/2021   CO2 24 01/01/2021    Lab Results  Component Value Date   ALT 12 12/30/2020   AST 15 12/30/2020   ALKPHOS 129 (H) 12/30/2020     Microbiology: Recent Results (from the past 240 hour(s))  Blood culture (routine single)     Status: Abnormal   Collection Time: 12/28/20  6:00 PM   Specimen: BLOOD  Result Value Ref Range Status   Specimen Description   Final    BLOOD RIGHT ANTECUBITAL Performed at [facility], [address] [place], [place] [address]    Special Requests   Final    BOTTLES DRAWN AEROBIC AND ANAEROBIC Blood Culture adequate volume Performed at [facility], [address], [place], [place] [address]    Culture  Setup Time   Final    GRAM POSITIVE COCCI IN CHAINS IN PAIRS IN BOTH AEROBIC AND ANAEROBIC BOTTLES CRITICAL RESULT CALLED TO, READ BACK BY AND VERIFIED WITH: J,MILLEN PHARMD @1305  12/29/20 EB Performed at [facility], [address], [place], [place]    Culture STREPTOCOCCUS DYSGALACTIAE (A)  Final    Report Status 12/31/2020 FINAL  Final   Organism ID, Bacteria STREPTOCOCCUS DYSGALACTIAE  Final      Susceptibility   Streptococcus dysgalactiae - MIC*    PENICILLIN <=0.06 SENSITIVE Sensitive     CEFTRIAXONE <=0.12 SENSITIVE Sensitive     ERYTHROMYCIN <=0.12 SENSITIVE Sensitive     LEVOFLOXACIN 0.5 SENSITIVE Sensitive     VANCOMYCIN 0.5 SENSITIVE Sensitive     * STREPTOCOCCUS DYSGALACTIAE  Blood Culture ID Panel (Reflexed)     Status: Abnormal   Collection Time: 12/28/20  6:00 PM  Result Value Ref Range Status   Enterococcus faecalis NOT DETECTED NOT DETECTED Final   Enterococcus Faecium NOT DETECTED NOT DETECTED Final   Listeria monocytogenes NOT DETECTED NOT DETECTED Final   Staphylococcus species NOT DETECTED NOT DETECTED Final   Staphylococcus aureus (BCID) NOT DETECTED NOT DETECTED Final   Staphylococcus epidermidis NOT DETECTED NOT DETECTED Final   Staphylococcus lugdunensis NOT DETECTED NOT DETECTED Final   Streptococcus species DETECTED (A) NOT DETECTED Final    Comment: Not Enterococcus species, Streptococcus agalactiae, Streptococcus pyogenes, or Streptococcus pneumoniae. CRITICAL RESULT CALLED TO, READ BACK BY AND VERIFIED WITH: J,MILLEN PHARMD @1305  12/29/20 EB    Streptococcus agalactiae NOT DETECTED NOT DETECTED Final   Streptococcus pneumoniae NOT DETECTED NOT DETECTED Final   Streptococcus pyogenes NOT DETECTED NOT DETECTED Final  A.calcoaceticus-baumannii NOT DETECTED NOT DETECTED Final   Bacteroides fragilis NOT DETECTED NOT DETECTED Final   Enterobacterales NOT DETECTED NOT DETECTED Final   Enterobacter cloacae complex NOT DETECTED NOT DETECTED Final   Escherichia coli NOT DETECTED NOT DETECTED Final   Klebsiella aerogenes NOT DETECTED NOT DETECTED Final   Klebsiella oxytoca NOT DETECTED NOT DETECTED Final   Klebsiella pneumoniae NOT DETECTED NOT DETECTED Final   Proteus species NOT DETECTED NOT DETECTED Final   Salmonella species NOT DETECTED NOT DETECTED  Final   Serratia marcescens NOT DETECTED NOT DETECTED Final   Haemophilus influenzae NOT DETECTED NOT DETECTED Final   Neisseria meningitidis NOT DETECTED NOT DETECTED Final   Pseudomonas aeruginosa NOT DETECTED NOT DETECTED Final   Stenotrophomonas maltophilia NOT DETECTED NOT DETECTED Final   Candida albicans NOT DETECTED NOT DETECTED Final   Candida auris NOT DETECTED NOT DETECTED Final   Candida glabrata NOT DETECTED NOT DETECTED Final   Candida krusei NOT DETECTED NOT DETECTED Final   Candida parapsilosis NOT DETECTED NOT DETECTED Final   Candida tropicalis NOT DETECTED NOT DETECTED Final   Cryptococcus neoformans/gattii NOT DETECTED NOT DETECTED Final    Comment: Performed at [facility], [address] [place], [place] [address]  Resp Panel by RT-PCR (Flu A&B, Covid) Nasopharyngeal Swab     Status: None   Collection Time: 12/28/20  6:08 PM   Specimen: Nasopharyngeal Swab; Nasopharyngeal(NP) swabs in vial transport medium  Result Value Ref Range Status   SARS Coronavirus 2 by RT PCR NEGATIVE NEGATIVE Final    Comment: (NOTE) SARS-CoV-2 target nucleic acids are NOT DETECTED.  The SARS-CoV-2 RNA is generally detectable in upper respiratory specimens during the acute phase of infection. The lowest concentration of SARS-CoV-2 viral copies this assay can detect is 138 copies/mL. A negative result does not preclude SARS-Cov-2 infection and should not be used as the sole basis for treatment or other patient management decisions. A negative result may occur with  improper specimen collection/handling, submission of specimen other than nasopharyngeal swab, presence of viral mutation(s) within the areas targeted by this assay, and inadequate number of viral copies(<138 copies/mL). A negative result must be combined with clinical observations, patient history, and epidemiological information. The expected result is Negative.  Fact Sheet for Patients:   BloggerCourse.com  Fact Sheet for Healthcare Providers:  SeriousBroker.it  This test is no t yet approved or cleared by the [place] FDA and  has been authorized for detection and/or diagnosis of SARS-CoV-2 by FDA under an Emergency Use Authorization (EUA). This EUA will remain  in effect (meaning this test can be used) for the duration of the COVID-19 declaration under Section 564(b)(1) of the Act, 21 U.S.C.section 360bbb-3(b)(1), unless the authorization is terminated  or revoked sooner.       Influenza A by PCR NEGATIVE NEGATIVE Final   Influenza B by PCR NEGATIVE NEGATIVE Final    Comment: (NOTE) The Xpert Xpress SARS-CoV-2/FLU/RSV plus assay is intended as an aid in the diagnosis of influenza from Nasopharyngeal swab specimens and should not be used as a sole basis for treatment. Nasal washings and aspirates are unacceptable for Xpert Xpress SARS-CoV-2/FLU/RSV testing.  Fact Sheet for Patients: BloggerCourse.com  Fact Sheet for Healthcare Providers: SeriousBroker.it  This test is not yet approved or cleared by the [place] FDA and has been authorized for detection and/or diagnosis of SARS-CoV-2 by FDA under an Emergency Use Authorization (EUA). This EUA will remain in effect (meaning this test can be used)  for the duration of the COVID-19 declaration under Section 564(b)(1) of the Act, 21 U.S.C. section 360bbb-3(b)(1), unless the authorization is terminated or revoked.  Performed at [facility], [address], [place], [place] [address]   Culture, blood (routine x 2)     Status: Abnormal   Collection Time: 12/28/20 10:30 PM   Specimen: BLOOD  Result Value Ref Range Status   Specimen Description   Final    BLOOD RIGHT ANTECUBITAL Performed at [facility], [address] [place], [place] [address]    Special  Requests   Final    BOTTLES DRAWN AEROBIC AND ANAEROBIC Blood Culture adequate volume Performed at [facility], [address], [place], [place] [address]    Culture  Setup Time   Final    GRAM POSITIVE COCCI IN BOTH AEROBIC AND ANAEROBIC BOTTLES CRITICAL VALUE NOTED.  VALUE IS CONSISTENT WITH PREVIOUSLY REPORTED AND CALLED VALUE.    Culture (A)  Final    STREPTOCOCCUS DYSGALACTIAE SUSCEPTIBILITIES PERFORMED ON PREVIOUS CULTURE WITHIN THE LAST 5 DAYS. Performed at [facility], [address] [place], [place] [address]    Report Status 12/31/2020 FINAL  Final  MRSA Next Gen by PCR, Nasal     Status: None   Collection Time: 12/29/20 12:20 AM   Specimen: Nasal Mucosa; Nasal Swab  Result Value Ref Range Status   MRSA by PCR Next Gen NOT DETECTED NOT DETECTED Final    Comment: (NOTE) The GeneXpert MRSA Assay (FDA approved for NASAL specimens only), is one component of a comprehensive MRSA colonization surveillance program. It is not intended to diagnose MRSA infection nor to guide or monitor treatment for MRSA infections. Test performance is not FDA approved in patients less than 74 years old. Performed at [facility], [address], [place], [place] [address]   Culture, blood (routine x 2)     Status: None (Preliminary result)   Collection Time: 12/30/20 11:21 AM   Specimen: BLOOD RIGHT HAND  Result Value Ref Range Status   Specimen Description BLOOD RIGHT HAND  Final   Special Requests   Final    BOTTLES DRAWN AEROBIC AND ANAEROBIC Blood Culture adequate volume   Culture   Final    NO GROWTH 2 DAYS Performed at [facility], [address] [place], [place] [address]    Report Status PENDING  Incomplete  Culture, blood (routine x 2)     Status: None (Preliminary result)   Collection Time: 12/30/20 11:21 AM   Specimen: BLOOD LEFT HAND  Result Value Ref Range Status   Specimen Description BLOOD LEFT HAND  Final   Special Requests    Final    AEROBIC BOTTLE ONLY Blood Culture results may not be optimal due to an inadequate volume of blood received in culture bottles   Culture   Final    NO GROWTH 2 DAYS Performed at [facility], [address], [place], [place] [address]    Report Status PENDING  Incomplete    Impression/Plan:  Bacteremia - unclear source.  Culture with Streptococcus.  No Rison signs of infection anywhere.  She has continued back pain but neurosurgery reviewed and not thought to be c/w infection.  She will follow up with neurosurgery as an outpatient.  Will narrow to penicillin She can continue with oral amoxicillin at discharge through October 9th, 2 weeks from negative blood culture  2. 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  This has been restarted and will continue this indefinitely.   I will sign off, call with any Jaco questions

## 2021-01-02 DIAGNOSIS — T847XXD Infection and inflammatory reaction due to other internal orthopedic prosthetic devices, implants and grafts, subsequent encounter: Secondary | ICD-10-CM

## 2021-01-02 LAB — CBC WITH DIFFERENTIAL/PLATELET
Abs Immature Granulocytes: 0.18 10*3/uL — ABNORMAL HIGH (ref 0.00–0.07)
Basophils Absolute: 0.2 10*3/uL — ABNORMAL HIGH (ref 0.0–0.1)
Basophils Relative: 1 %
Eosinophils Absolute: 0.4 10*3/uL (ref 0.0–0.5)
Eosinophils Relative: 3 %
HCT: 35.2 % — ABNORMAL LOW (ref 36.0–46.0)
Hemoglobin: 11.5 g/dL — ABNORMAL LOW (ref 12.0–15.0)
Immature Granulocytes: 1 %
Lymphocytes Relative: 17 %
Lymphs Abs: 2.2 10*3/uL (ref 0.7–4.0)
MCH: 29.9 pg (ref 26.0–34.0)
MCHC: 32.7 g/dL (ref 30.0–36.0)
MCV: 91.4 fL (ref 80.0–100.0)
Monocytes Absolute: 0.8 10*3/uL (ref 0.1–1.0)
Monocytes Relative: 6 %
Neutro Abs: 9.6 10*3/uL — ABNORMAL HIGH (ref 1.7–7.7)
Neutrophils Relative %: 72 %
Platelets: 318 10*3/uL (ref 150–400)
RBC: 3.85 MIL/uL — ABNORMAL LOW (ref 3.87–5.11)
RDW: 13.2 % (ref 11.5–15.5)
WBC: 13.4 10*3/uL — ABNORMAL HIGH (ref 4.0–10.5)
nRBC: 0 % (ref 0.0–0.2)

## 2021-01-02 LAB — BASIC METABOLIC PANEL
Anion gap: 9 (ref 5–15)
BUN: 8 mg/dL (ref 8–23)
CO2: 24 mmol/L (ref 22–32)
Calcium: 8.9 mg/dL (ref 8.9–10.3)
Chloride: 103 mmol/L (ref 98–111)
Creatinine, Ser: 0.84 mg/dL (ref 0.44–1.00)
GFR, Estimated: >60 mL/min (ref >60)
Glucose, Bld: 141 mg/dL — ABNORMAL HIGH (ref 70–99)
Potassium: 4 mmol/L (ref 3.5–5.1)
Sodium: 136 mmol/L (ref 135–145)

## 2021-01-02 LAB — RESP PANEL BY RT-PCR (FLU A&B, COVID) ARPGX2
Influenza A by PCR: NEGATIVE
Influenza B by PCR: NEGATIVE
SARS Coronavirus 2 by RT PCR: NEGATIVE

## 2021-01-02 LAB — GLUCOSE, CAPILLARY
Glucose-Capillary: 195 mg/dL — ABNORMAL HIGH (ref 70–99)
Glucose-Capillary: 257 mg/dL — ABNORMAL HIGH (ref 70–99)
Glucose-Capillary: 209 mg/dL — ABNORMAL HIGH (ref 70–99)
Glucose-Capillary: 170 mg/dL — ABNORMAL HIGH (ref 70–99)
Glucose-Capillary: 151 mg/dL — ABNORMAL HIGH (ref 70–99)
Glucose-Capillary: 162 mg/dL — ABNORMAL HIGH (ref 70–99)

## 2021-01-02 MED ORDER — MIDODRINE HCL 10 MG PO TABS: 30.0000 mg | ORAL_TABLET | Freq: Three times a day (TID) | ORAL | Status: AC

## 2021-01-02 MED ORDER — TRAMADOL HCL 50 MG PO TABS: 25.0000 mg | ORAL_TABLET | Freq: Four times a day (QID) | ORAL | 0 refills | Status: AC | PRN

## 2021-01-02 MED ORDER — AMOXICILLIN 500 MG PO TABS: 500.0000 mg | ORAL_TABLET | Freq: Three times a day (TID) | ORAL | 0 refills | Status: DC

## 2021-01-02 MED ORDER — AMOXICILLIN 500 MG PO TABS: 1000.0000 mg | ORAL_TABLET | Freq: Three times a day (TID) | ORAL | 0 refills | Status: AC

## 2021-01-02 NOTE — NC FL2 (Signed)
[place] MEDICAID FL2 LEVEL OF CARE SCREENING TOOL     IDENTIFICATION  Patient Name: Tonya Baker Birthdate: 1950-10-25 Sex: female Admission Date (Current Location): 12/28/2020  [facility] and [place] Number:  Producer, television/film/video and Address:         Provider Number: [id number]  Attending Physician Name and Address:  Dorcas Carrow, MD  Relative Name and Phone Number:       Current Level of Care: Hospital Recommended Level of Care: Skilled Nursing Facility Prior Approval Number:    Date Approved/Denied:   PASRR Number: [id number]  Discharge Plan: SNF    Current Diagnoses: Patient Active Problem List   Diagnosis Date Noted   Bacteremia due to Streptococcus    Volume overload 12/29/2020   Hardware complicating wound infection (HCC)    Vertebral osteomyelitis (HCC)    Back pain 12/28/2020   Hypotension 12/28/2020   CKD (chronic kidney disease) stage 3, GFR 30-59 ml/min (HCC) 12/28/2020   Spinal stenosis 12/28/2020   Obstructive sleep apnea 12/28/2020   Sepsis (HCC) 12/28/2020   Type 2 diabetes mellitus with diabetic neuropathy, with long-term current use of insulin (HCC) 12/28/2020    Orientation RESPIRATION BLADDER Height & Weight     Self, Time, Situation, Place  Normal External catheter Weight: 92.1 kg Height:  5\' 4"  (162.6 cm)  BEHAVIORAL SYMPTOMS/MOOD NEUROLOGICAL BOWEL NUTRITION STATUS      Continent Diet (refer to d/c summry)  AMBULATORY STATUS COMMUNICATION OF NEEDS Skin   Extensive Assist Verbally Normal                       Personal Care Assistance Level of Assistance  Bathing, Feeding, Dressing Bathing Assistance: Maximum assistance Feeding assistance: Independent Dressing Assistance: Maximum assistance     Functional Limitations Info  Sight, Hearing, Speech Sight Info: Adequate Hearing Info: Adequate Speech Info: Adequate    SPECIAL CARE FACTORS FREQUENCY  PT (By licensed PT), OT (By licensed OT)     PT Frequency: 5x/week  evaluate and treat OT Frequency: 5x/week evaluate and treat            Contractures Contractures Info: Not present    Additional Factors Info  Code Status, Allergies Code Status Info: Full Code             Current Medications (01/02/2021):  This is the current hospital active medication list Current Facility-Administered Medications  Medication Dose Route Frequency Provider Last Rate Last Admin   0.9 %  sodium chloride infusion   Intravenous PRN 01/04/2021, MD   Stopped at 12/30/20 1505   acetaminophen (TYLENOL) tablet 650 mg  650 mg Oral Q4H PRN 01/01/21, MD   650 mg at 01/01/21 0550   albuterol (PROVENTIL) (2.5 MG/3ML) 0.083% nebulizer solution 2.5 mg  2.5 mg Nebulization Q8H PRN 01/03/21, MD   2.5 mg at 12/30/20 0054   aspirin chewable tablet 81 mg  81 mg Oral q morning 01/01/21, MD   81 mg at 01/02/21 0907   atorvastatin (LIPITOR) tablet 40 mg  40 mg Oral QHS 01/04/21, MD   40 mg at 01/01/21 2248   Chlorhexidine Gluconate Cloth 2 % PADS 6 each  6 each Topical Daily 2249, MD   6 each at 01/02/21 0852   cyclobenzaprine (FLEXERIL) tablet 5 mg  5 mg Oral TID PRN 01/04/21, MD   5 mg at 01/01/21 01/03/21   dextromethorphan-guaiFENesin (MUCINEX DM) 30-600 MG per 12 hr tablet 1 tablet  1 tablet Oral BID PRN Darl Pikes, MD   1 tablet at 12/29/20 2208   docusate sodium (COLACE) capsule 100 mg  100 mg Oral BID Verdene Lennert, MD   100 mg at 01/01/21 2247   doxycycline (VIBRA-TABS) tablet 100 mg  100 mg Oral Q12H Gardiner Barefoot, MD   100 mg at 01/02/21 0908   enoxaparin (LOVENOX) injection 40 mg  40 mg Subcutaneous Q24H Verdene Lennert, MD   40 mg at 01/01/21 1444   gabapentin (NEURONTIN) capsule 600 mg  600 mg Oral TID Verdene Lennert, MD   600 mg at 01/02/21 0272   hydrocerin (EUCERIN) cream 1 application  1 application Topical Daily Marcelle Smiling, MD   1 application at 01/01/21 0927   insulin aspart (novoLOG) injection  0-20 Units  0-20 Units Subcutaneous TID WC Verdene Lennert, MD   7 Units at 01/02/21 1258   insulin detemir (LEVEMIR) injection 22 Units  22 Units Subcutaneous Daily Verdene Lennert, MD   22 Units at 01/02/21 0901   midodrine (PROAMATINE) tablet 30 mg  30 mg Oral TID with meals Cheri Fowler, MD   30 mg at 01/02/21 1259   multivitamin with minerals tablet 1 tablet  1 tablet Oral q morning Marcelle Smiling, MD   1 tablet at 01/02/21 0905   ondansetron (ZOFRAN) injection 4 mg  4 mg Intravenous Q6H PRN Marcelle Smiling, MD   4 mg at 01/01/21 0647   pantoprazole (PROTONIX) EC tablet 40 mg  40 mg Oral QHS Link Snuffer B, RPH   40 mg at 01/01/21 2247   penicillin G potassium 12 Million Units in dextrose 5 % 500 mL continuous infusion  12 Million Units Intravenous Q12H Gardiner Barefoot, MD 41.7 mL/hr at 01/02/21 1259 12 Million Units at 01/02/21 1259   polyethylene glycol (MIRALAX / GLYCOLAX) packet 17 g  17 g Oral Daily Verdene Lennert, MD       sertraline (ZOLOFT) tablet 12.5 mg  12.5 mg Oral Daily Marcelle Smiling, MD   12.5 mg at 01/02/21 5366   traMADol (ULTRAM) tablet 25 mg  25 mg Oral Q8H PRN Verdene Lennert, MD   25 mg at 01/01/21 1832     Discharge Medications: Please see discharge summary for a list of discharge medications.  Relevant Imaging Results:  Relevant Lab Results:   Additional Information SS# [id number]  Epifanio Lesches, RN

## 2021-01-02 NOTE — Evaluation (Signed)
Physical Therapy Evaluation/ Discharge Patient Details Name: Tonya Baker MRN: [id number] DOB: 02-Apr-1951 Today's Date: 01/02/2021  History of Present Illness  70 yo admitted 9/24 from [facility] with low back pain and strep bactermia as well as hypotension. Pt with acute osteomyelitis. PMhx: pt with 3 back sx in [place] in March of this year, chronic hypotension, HFpEF, DM, CAD, OSA, neuropathy  Clinical Impression  Pt reports being at [facility] for 5 months, non ambulatory and performing tranfers to WC. Pt currently able to transition to sitting and pivot to chair without physical assist which pt reports is baseline. Pt states she is receiving PT at facility and will defer further needs back to SNF. REcommend OOB to chair with nursing daily for meals. Pt denied HEP at this time and encouraged to perform later as she states she typically does. Will sign off.    Supine 78/53 (62) Chair 87/68 (76)  HR 81, 98% RA     Recommendations for follow up therapy are one component of a multi-disciplinary discharge planning process, led by the attending physician.  Recommendations may be updated based on patient status, additional functional criteria and insurance authorization.  Follow Up Recommendations SNF (return to SNF)    Equipment Recommendations  None recommended by PT    Recommendations for Other Services       Precautions / Restrictions Precautions Precautions: Other (comment) Precaution Comments: watch BP Restrictions Weight Bearing Restrictions: No      Mobility  Bed Mobility Overal bed mobility: Needs Assistance Bed Mobility: Rolling;Supine to Sit;Sit to Supine Rolling: Modified independent (Device/Increase time) Sidelying to sit: Supervision Supine to sit: Mod assist Sit to supine: Mod assist   General bed mobility comments: low back pain that radiates to L thigh.    Transfers Overall transfer level: Needs assistance Equipment used: 1 person hand held  assist Transfers: Sit to/from [facility] Sit to Stand: Min guard Stand pivot transfers: Min guard       General transfer comment: declined OOB to recliner.  Ambulation/Gait             General Gait Details: pt reports non ambulatory since back sx  Stairs            Wheelchair Mobility    Modified Rankin (Stroke Patients Only)       Balance Overall balance assessment: Needs assistance   Sitting balance-Leahy Scale: Good Sitting balance - Comments: EOB without UE support     Standing balance-Leahy Scale: Fair Standing balance comment: pt able to stand without UE support but reaching out for furniture for pivot and weight shift                             Pertinent Vitals/Pain Pain Assessment: Faces Pain Score: 4  Faces Pain Scale: Hurts little more Pain Location: low back pain Pain Descriptors / Indicators: Aching Pain Intervention(s): Limited activity within patient's tolerance;Monitored during session;Repositioned    Home Living Family/patient expects to be discharged to:: Skilled nursing facility                      Prior Function Level of Independence: Needs assistance   Gait / Transfers Assistance Needed: SPT from surface to surface without AD.  Has a RW for mobility.  Uses w/c for longer distances.  ADL's / Homemaking Assistance Needed: Mod I with ADL completion wheelchair level.  Showers up to 3x/wk with supervision.  Hand Dominance   Dominant Hand: Right    Extremity/Trunk Assessment   Upper Extremity Assessment Upper Extremity Assessment: Overall WFL for tasks assessed    Lower Extremity Assessment Lower Extremity Assessment: Defer to PT evaluation    Cervical / Trunk Assessment Cervical / Trunk Assessment: Kyphotic  Communication   Communication: No difficulties  Cognition Arousal/Alertness: Awake/alert Behavior During Therapy: WFL for tasks assessed/performed Overall Cognitive  Status: Within Functional Limits for tasks assessed                                        General Comments      Exercises     Assessment/Plan    PT Assessment All further PT needs can be met in the next venue of care  PT Problem List Decreased mobility;Decreased activity tolerance;Decreased balance       PT Treatment Interventions      PT Goals (Current goals can be found in the Care Plan section)  Acute Rehab PT Goals Patient Stated Goal: be able to move more PT Goal Formulation: All assessment and education complete, DC therapy    Frequency     Barriers to discharge        Co-evaluation               AM-PAC PT "6 Clicks" Mobility  Outcome Measure Help needed turning from your back to your side while in a flat bed without using bedrails?: None Help needed moving from lying on your back to sitting on the side of a flat bed without using bedrails?: None Help needed moving to and from a bed to a chair (including a wheelchair)?: A Little Help needed standing up from a chair using your arms (e.g., wheelchair or bedside chair)?: A Little Help needed to walk in hospital room?: A Lot Help needed climbing 3-5 steps with a railing? : Total 6 Click Score: 17    End of Session Equipment Utilized During Treatment: Gait belt Activity Tolerance: Patient tolerated treatment well Patient left: in chair;with call bell/phone within reach;with chair alarm set Nurse Communication: Mobility status PT Visit Diagnosis: Other abnormalities of gait and mobility (R26.89);Muscle weakness (generalized) (M62.81)    Time: 5465-6812 PT Time Calculation (min) (ACUTE ONLY): 21 min   Charges:   PT Evaluation $PT Eval Moderate Complexity: 1 Mod          Rishav Rockefeller P, PT Acute Rehabilitation Services Pager: [phone] Office: [phone]   Sandy Salaam Valerie Fredin 01/02/2021, 10:41 AM

## 2021-01-02 NOTE — Progress Notes (Signed)
PROGRESS NOTE    Tonya Baker  RCV:[id number] DOB: Mar 26, 1951 DOA: 12/28/2020 PCP: Eloisa Northern, MD    Brief Narrative:  70 year old female with history of chronic low back pain status post fusion surgery in March 2022 with MRSA infection and subsequent multiple spine surgeries, complicated with 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  Apparently just moved from [place] to [place].  In the emergency room mentating well, was given Percocet in the ER, blood pressures trended down so admitted to ICU with IV fluid boluses and started on Levophed.  She does have chronic hypertension and on midodrine, this was resumed with improvement in blood pressures.  Blood cultures from 9/24 positive for Streptococcus Dysgalactiae 9/26, no growth so far.  Primary source unknown. MRI of the lumbar spine with reactive marrow edema at L5-S1 interspace.  Suspected hardware loosening versus infection.   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  Neurosurgery recommended no surgical treatment.  Adequate medications for pain relief. Treated with penicillin G, ID recommended changing to oral amoxicillin for 2 weeks from negative blood cultures.  Sepsis present on admission, resolved.  Chronic hypotension: On midodrine 10 mg 3 times a day at nursing  home.  Transiently required Levophed. Midodrine increased to 30 mg 3 times a day.  Maintain hydration.  Encourage oral intake.  Work with therapies today.  Obstructive sleep apnea: Using CPAP at night.  Acute kidney injury with history of chronic kidney disease stage IIIa: Improved and normalized.  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  On Reglan and gabapentin that is continued.  History of coronary disease: Stable on aspirin and Lipitor.  Mobilize with therapy.  Transfer back to skilled nursing facility to manage her chronic mobility issues.   DVT prophylaxis: enoxaparin (LOVENOX) injection 40 mg Start: 01/01/21 1500 SCDs Start: 12/28/20 2228   Code Status: Full code Family Communication: None Disposition Plan: Status is: Inpatient  Remains inpatient appropriate because:Unsafe d/c plan  Dispo: The patient is from: SNF              Anticipated d/c is to: SNF              Patient currently is medically stable to d/c.   Difficult to place patient No         Consultants:  PCCM Infectious disease Neurosurgery  Procedures:  None  Antimicrobials:  Antibiotics Given (last 72 hours)     Date/Time Action Medication Dose Rate   12/30/20 1954 Bedwell Bag/Given   cefTRIAXone (ROCEPHIN) 2 g in sodium chloride 0.9 % 100 mL IVPB 2 g 200 mL/hr   12/31/20 0828 Ohanian Bag/Given   vancomycin (VANCOREADY) IVPB 750 mg/150 mL 750 mg 150 mL/hr   12/31/20 1150 Given   doxycycline (VIBRA-TABS) tablet 100 mg 100 mg    12/31/20 1154 Sherrod Bag/Given   penicillin G  potassium 12 Million Units in dextrose 5 % 500 mL continuous infusion 12 Million Units 41.7 mL/hr   12/31/20 2122 Given   doxycycline (VIBRA-TABS) tablet 100 mg 100 mg    12/31/20 2358 Mcgruder Bag/Given   penicillin G potassium 12 Million Units in dextrose 5 % 500 mL continuous infusion 12 Million Units 41.7 mL/hr   01/01/21 0920 Given   doxycycline (VIBRA-TABS) tablet 100 mg 100 mg    01/01/21  1445 Polidori Bag/Given   penicillin G potassium 12 Million Units in dextrose 5 % 500 mL continuous infusion 12 Million Units 41.7 mL/hr   01/01/21 2247 Given   doxycycline (VIBRA-TABS) tablet 100 mg 100 mg    01/02/21 0015 Savitt Bag/Given   penicillin G potassium 12 Million Units in dextrose 5 % 500 mL continuous infusion 12 Million Units 41.7 mL/hr   01/02/21 0908 Given   doxycycline (VIBRA-TABS) tablet 100 mg 100 mg    01/02/21 1259 Segreto Bag/Given   penicillin G potassium 12 Million Units in dextrose 5 % 500 mL continuous infusion 12 Million Units 41.7 mL/hr          Subjective: Patient seen and examined.  Complains of some low back pain otherwise no other complaints.  Afebrile.  Overnight events noted.  Noted to have low blood pressures but they were asymptomatic.  Current blood pressure is 107/58.  She tells me that she does not walk for at least 5 months.  Does not use any chronic pain medications.  Objective: Vitals:   01/02/21 1000 01/02/21 1100 01/02/21 1200 01/02/21 1300  BP: 139/65 111/64 (!) 99/57 (!) 107/58  Pulse: 71 68 71 72  Resp: (!) 25 19 18 19   Temp:      TempSrc:      SpO2: 96% 95% 94% 95%  Weight:      Height:        Intake/Output Summary (Last 24 hours) at 01/02/2021 1346 Last data filed at 01/02/2021 0900 Gross per 24 hour  Intake 844.44 ml  Output 2700 ml  Net -1855.56 ml   Filed Weights   12/30/20 0334 12/31/20 0500 01/01/21 0500  Weight: 92.2 kg 92.3 kg 92.1 kg    Examination:  General exam: Appears calm and comfortable  Looks comfortable and resting on room air.  Laying in bed. Respiratory system: Clear to auscultation. Respiratory effort normal.  No added sounds. Cardiovascular system: S1 & S2 heard, RRR.  Gastrointestinal system: Soft.  Nontender.   Central nervous system: Alert and oriented. No focal neurological deficits. No evidence of extremities weakness.  Symmetrical.   Data Reviewed: I have personally reviewed following labs and imaging  studies  CBC: Recent Labs  Lab 12/28/20 1613 12/28/20 2331 12/29/20 0132 12/30/20 1121 12/31/20 1332 01/01/21 0233 01/02/21 0209  WBC 13.9*   < > 14.9* 9.8 13.9* 10.1 13.4*  NEUTROABS 12.2*  --   --  8.0* 11.2* 6.9 9.6*  HGB 12.6   < > 12.5 10.6* 12.1 10.4* 11.5*  HCT 39.2   < > 38.2 32.8* 37.3 31.8* 35.2*  MCV 93.1   < > 92.0 91.9 91.2 91.1 91.4  PLT 155   < > 121* 187 268 222 318   < > = values in this interval not displayed.   Basic Metabolic Panel: Recent Labs  Lab 12/29/20 0132 12/30/20 1121 12/31/20 1332 01/01/21 0233 01/02/21 0209  NA 135 135 136 133* 136  K 4.1 3.0* 3.4* 3.4* 4.0  CL 100 99 101 102 103  CO2  21* GLUCOSE 94 242* 112* 163* 141*  BUN CREATININE 1.28* 1.22* 0.90 0.83 0.84  CALCIUM 8.6* 8.3* 8.7* 8.2* 8.9  MG 1.5* 2.1  --   --   --    GFR: Estimated Creatinine Clearance: 69.6 mL/min (by C-G formula based on SCr of 0.84 mg/dL). Liver Function Tests: Recent Labs  Lab 12/28/20 1707 12/30/20 1121  AST 19 15  ALT 14 12  ALKPHOS 137* 129*  BILITOT 1.3* 0.8  PROT 6.2* 6.1*  ALBUMIN 2.3* 1.9*   No results for input(s): LIPASE, AMYLASE in the last 168 hours. No results for input(s): AMMONIA in the last 168 hours. Coagulation Profile: Recent Labs  Lab 12/28/20 1803 12/30/20 1121  INR 1.3* 1.3*   Cardiac Enzymes: No results for input(s): CKTOTAL, CKMB, CKMBINDEX, TROPONINI in the last 168 hours. BNP (last 3 results) No results for input(s): PROBNP in the last 8760 hours. HbA1C: No results for input(s): HGBA1C in the last 72 hours. CBG: Recent Labs  Lab 01/01/21 1936 01/01/21 2342 01/02/21 0347 01/02/21 0721 01/02/21 1149  GLUCAP 99 98 195* 257* 209*   Lipid Profile: No results for input(s): CHOL, HDL, LDLCALC, TRIG, CHOLHDL, LDLDIRECT in the last 72 hours. Thyroid Function Tests: No results for input(s): TSH, T4TOTAL, FREET4, T3FREE, THYROIDAB in the last 72 hours. Anemia Panel: No results for  input(s): VITAMINB12, FOLATE, FERRITIN, TIBC, IRON, RETICCTPCT in the last 72 hours. Sepsis Labs: Recent Labs  Lab 12/28/20 2331 12/29/20 0130 12/29/20 0534 12/29/20 0937 12/29/20 1557  PROCALCITON 0.90  --   --   --   --   LATICACIDVEN  --  2.3* 2.6* 1.2 0.9    Recent Results (from the past 240 hour(s))  Blood culture (routine single)     Status: Abnormal   Collection Time: 12/28/20  6:00 PM   Specimen: BLOOD  Result Value Ref Range Status   Specimen Description   Final    BLOOD RIGHT ANTECUBITAL Performed at [facility], [address] [place], [place] [address]    Special Requests   Final    BOTTLES DRAWN AEROBIC AND ANAEROBIC Blood Culture adequate volume Performed at [facility], [address], [place], [place] [address]    Culture  Setup Time   Final    GRAM POSITIVE COCCI IN CHAINS IN PAIRS IN BOTH AEROBIC AND ANAEROBIC BOTTLES CRITICAL RESULT CALLED TO, READ BACK BY AND VERIFIED WITH: J,MILLEN PHARMD  12/29/20 EB Performed at [facility], [address] [place], [place] [address]    Culture STREPTOCOCCUS DYSGALACTIAE (A)  Final   Report Status 12/31/2020 FINAL  Final   Organism ID, Bacteria STREPTOCOCCUS DYSGALACTIAE  Final      Susceptibility   Streptococcus dysgalactiae - MIC*    PENICILLIN <=0.06 SENSITIVE Sensitive     CEFTRIAXONE <=0.12 SENSITIVE Sensitive     ERYTHROMYCIN <=0.12 SENSITIVE Sensitive     LEVOFLOXACIN 0.5 SENSITIVE Sensitive     VANCOMYCIN 0.5 SENSITIVE Sensitive     * STREPTOCOCCUS DYSGALACTIAE  Blood Culture ID Panel (Reflexed)     Status: Abnormal   Collection Time: 12/28/20  6:00 PM  Result Value Ref Range Status   Enterococcus faecalis NOT DETECTED NOT DETECTED Final   Enterococcus Faecium NOT DETECTED NOT DETECTED Final   Listeria monocytogenes NOT DETECTED NOT DETECTED Final   Staphylococcus species NOT DETECTED NOT DETECTED Final   Staphylococcus aureus (BCID) NOT DETECTED NOT  DETECTED Final   Staphylococcus epidermidis NOT DETECTED NOT DETECTED Final   Staphylococcus lugdunensis NOT DETECTED NOT DETECTED Final   Streptococcus species DETECTED (A) NOT DETECTED Final    Comment: Not Enterococcus species, Streptococcus agalactiae, Streptococcus pyogenes, or Streptococcus pneumoniae. CRITICAL RESULT CALLED TO, READ BACK BY AND VERIFIED WITH: J,MILLEN PHARMD @1305  12/29/20 EB    Streptococcus agalactiae NOT DETECTED NOT DETECTED Final   Streptococcus pneumoniae NOT DETECTED NOT DETECTED Final   Streptococcus pyogenes NOT DETECTED NOT DETECTED Final   A.calcoaceticus-baumannii NOT DETECTED NOT DETECTED Final   Bacteroides fragilis NOT DETECTED NOT DETECTED Final   Enterobacterales NOT DETECTED NOT DETECTED Final   Enterobacter cloacae complex NOT DETECTED NOT DETECTED Final   Escherichia coli NOT DETECTED NOT DETECTED Final   Klebsiella aerogenes NOT DETECTED NOT DETECTED Final   Klebsiella oxytoca NOT DETECTED NOT DETECTED Final   Klebsiella pneumoniae NOT DETECTED NOT DETECTED Final   Proteus species NOT DETECTED NOT DETECTED Final   Salmonella species NOT DETECTED NOT DETECTED Final   Serratia marcescens NOT DETECTED NOT DETECTED Final   Haemophilus influenzae NOT DETECTED NOT DETECTED Final   Neisseria meningitidis NOT DETECTED NOT DETECTED Final   Pseudomonas aeruginosa NOT DETECTED NOT DETECTED Final   Stenotrophomonas maltophilia NOT DETECTED NOT DETECTED Final   Candida albicans NOT DETECTED NOT DETECTED Final   Candida auris NOT DETECTED NOT DETECTED Final   Candida glabrata NOT DETECTED NOT DETECTED Final   Candida krusei NOT DETECTED NOT DETECTED Final   Candida parapsilosis NOT DETECTED NOT DETECTED Final   Candida tropicalis NOT DETECTED NOT DETECTED Final   Cryptococcus neoformans/gattii NOT DETECTED NOT DETECTED Final    Comment: Performed at [facility], [address], [place], [place]  Resp Panel by RT-PCR (Flu A&B, Covid)  Nasopharyngeal Swab     Status: None   Collection Time: 12/28/20  6:08 PM   Specimen: Nasopharyngeal Swab; Nasopharyngeal(NP) swabs in vial transport medium  Result Value Ref Range Status   SARS Coronavirus 2 by RT PCR NEGATIVE NEGATIVE Final    Comment: (NOTE) SARS-CoV-2 target nucleic acids are NOT DETECTED.  The SARS-CoV-2 RNA is generally detectable in upper respiratory specimens during the acute phase of infection. The lowest concentration of SARS-CoV-2 viral copies this assay can detect is 138 copies/mL. A negative result does not preclude SARS-Cov-2 infection and should not be used as the sole basis for treatment or other patient management decisions. A negative result may occur with  improper specimen collection/handling, submission of specimen other than nasopharyngeal swab, presence of viral mutation(s) within the areas targeted by this assay, and inadequate number of viral copies(<138 copies/mL). A negative result must be combined with clinical observations, patient history, and epidemiological information. The expected result is Negative.  Fact Sheet for Patients:  12/30/20  Fact Sheet for Healthcare Providers:  BloggerCourse.com  This test is no t yet approved or cleared by the [place] FDA and  has been authorized for detection and/or diagnosis of SARS-CoV-2 by FDA under an Emergency Use Authorization (EUA). This EUA will remain  in effect (meaning this test can be used) for the duration of the COVID-19 declaration under Section 564(b)(1) of the Act, 21 U.S.C.section 360bbb-3(b)(1), unless the authorization is terminated  or revoked sooner.       Influenza A by PCR NEGATIVE NEGATIVE Final   Influenza B by PCR NEGATIVE NEGATIVE Final    Comment: (NOTE) The Xpert Xpress SARS-CoV-2/FLU/RSV plus assay is intended as an aid in  the diagnosis of influenza from Nasopharyngeal swab specimens and should not be  used as a sole basis for treatment. Nasal washings and aspirates are unacceptable for Xpert Xpress SARS-CoV-2/FLU/RSV testing.  Fact Sheet for Patients: BloggerCourse.com  Fact Sheet for Healthcare Providers: SeriousBroker.it  This test is not yet approved or cleared by the [place] FDA and has been authorized for detection and/or diagnosis of SARS-CoV-2 by FDA under an Emergency Use Authorization (EUA). This EUA will remain in effect (meaning this test can be used) for the duration of the COVID-19 declaration under Section 564(b)(1) of the Act, 21 U.S.C. section 360bbb-3(b)(1), unless the authorization is terminated or revoked.  Performed at [facility], [address] [place], [place] [address]   Culture, blood (routine x 2)     Status: Abnormal   Collection Time: 12/28/20 10:30 PM   Specimen: BLOOD  Result Value Ref Range Status   Specimen Description   Final    BLOOD RIGHT ANTECUBITAL Performed at [facility], [address], [place], [place] [address]    Special Requests   Final    BOTTLES DRAWN AEROBIC AND ANAEROBIC Blood Culture adequate volume Performed at [facility], [address], [place], [place] [address]    Culture  Setup Time   Final    GRAM POSITIVE COCCI IN BOTH AEROBIC AND ANAEROBIC BOTTLES CRITICAL VALUE NOTED.  VALUE IS CONSISTENT WITH PREVIOUSLY REPORTED AND CALLED VALUE.    Culture (A)  Final    STREPTOCOCCUS DYSGALACTIAE SUSCEPTIBILITIES PERFORMED ON PREVIOUS CULTURE WITHIN THE LAST 5 DAYS. Performed at [facility], [address], [place], [place] [address]    Report Status 12/31/2020 FINAL  Final  MRSA Next Gen by PCR, Nasal     Status: None   Collection Time: 12/29/20 12:20 AM   Specimen: Nasal Mucosa; Nasal Swab  Result Value Ref Range Status   MRSA by PCR Next Gen NOT DETECTED NOT DETECTED Final    Comment: (NOTE) The  GeneXpert MRSA Assay (FDA approved for NASAL specimens only), is one component of a comprehensive MRSA colonization surveillance program. It is not intended to diagnose MRSA infection nor to guide or monitor treatment for MRSA infections. Test performance is not FDA approved in patients less than 36 years old. Performed at [facility], [address], [place], [place] [address]   Culture, blood (routine x 2)     Status: None (Preliminary result)   Collection Time: 12/30/20 11:21 AM   Specimen: BLOOD RIGHT HAND  Result Value Ref Range Status   Specimen Description BLOOD RIGHT HAND  Final   Special Requests   Final    BOTTLES DRAWN AEROBIC AND ANAEROBIC Blood Culture adequate volume   Culture   Final    NO GROWTH 3 DAYS Performed at [facility], [address] [place], [place] [address]    Report Status PENDING  Incomplete  Culture, blood (routine x 2)     Status: None (Preliminary result)   Collection Time: 12/30/20 11:21 AM   Specimen: BLOOD LEFT HAND  Result Value Ref Range Status   Specimen Description BLOOD LEFT HAND  Final   Special Requests   Final    AEROBIC BOTTLE ONLY Blood Culture results may not be optimal due to an inadequate volume of blood received in culture bottles   Culture   Final    NO GROWTH 3 DAYS Performed at [facility], [address],  [place], [place] [address]    Report Status PENDING  Incomplete         Radiology Studies: No results found.      Scheduled Meds:  aspirin  81 mg Oral q morning   atorvastatin  40 mg Oral QHS   Chlorhexidine Gluconate Cloth  6 each Topical Daily   docusate sodium  100 mg Oral BID   doxycycline  100 mg Oral Q12H   enoxaparin (LOVENOX) injection  40 mg Subcutaneous Q24H   gabapentin  600 mg Oral TID   hydrocerin  1 application Topical Daily   insulin aspart  0-20 Units Subcutaneous TID WC   insulin detemir  22 Units Subcutaneous Daily   midodrine  30 mg Oral TID with meals    multivitamin with minerals  1 tablet Oral q morning   pantoprazole  40 mg Oral QHS   polyethylene glycol  17 g Oral Daily   sertraline  12.5 mg Oral Daily   Continuous Infusions:  sodium chloride Stopped (12/30/20 1505)   penicillin g continuous IV infusion 12 Million Units (01/02/21 1259)     LOS: 5 days    Time spent: 35 minutes    Dorcas Carrow, MD [facility] Pager [phone]

## 2021-01-02 NOTE — TOC Initial Note (Signed)
Transition of Care [facility]) - Initial/Assessment Note    Patient Details  Name: Tonya Baker MRN: [id number] Date of Birth: 1951/03/29  [facility]) CM/SW Contact:    Epifanio Lesches, RN Phone Number: 01/02/2021, 1:34 PM  Clinical Narrative:                 Admitted 9/24 with low back pain, strep bactermia and hypotension/ acute osteomyelitis. From [facility].   Per MD patient ready for d/c today. NCM spoke with pt @ bedside regarding d/c plan. Pt stated would like to return to [facility]. NCM made [place] @ [facility] aware.Olegario Messier to f/u with NCM regarding bed availability for today.     01/02/2021 @ 1410 Call received from [facility] admissions. Olegario Messier informed NCM  SNF bed will be available on tomorrow, NCM made nurse and MD aware.  TOC  team will continue to monitor and assist with TOC needs ....  Expected Discharge Plan: Skilled Nursing Facility     Patient Goals and CMS Choice        Expected Discharge Plan and Services Expected Discharge Plan: Skilled Nursing Facility       Living arrangements for the past 2 months: Skilled Nursing Facility [facility]) Expected Discharge Date: 01/02/21                                    Prior Living Arrangements/Services Living arrangements for the past 2 months: Skilled Nursing Facility [facility])                     Activities of Daily Living      Permission Sought/Granted   Permission granted to share information with : Yes, Verbal Permission Granted              Emotional Assessment              Admission diagnosis:  Acute osteomyelitis of lumbar spine (HCC) [M46.26] Hypotension [I95.9] Septic shock (HCC) [A41.9, R65.21] Sepsis (HCC) [A41.9] Patient Active Problem List   Diagnosis Date Noted   Bacteremia due to Streptococcus    Volume overload 12/29/2020   Hardware complicating wound infection (HCC)    Vertebral osteomyelitis (HCC)    Back pain 12/28/2020   Hypotension 12/28/2020   CKD (chronic kidney disease)  stage 3, GFR 30-59 ml/min (HCC) 12/28/2020   Spinal stenosis 12/28/2020   Obstructive sleep apnea 12/28/2020   Sepsis (HCC) 12/28/2020   Type 2 diabetes mellitus with diabetic neuropathy, with long-term current use of insulin (HCC) 12/28/2020   PCP:  Eloisa Northern, MD Pharmacy:  No Pharmacies Listed    Social Determinants of Health (SDOH) Interventions    Readmission Risk Interventions No flowsheet data found.

## 2021-01-02 NOTE — Evaluation (Signed)
Occupational Therapy Evaluation Patient Details Name: Tonya Baker MRN: [id number] DOB: 1950/08/10 Today's Date: 01/02/2021   History of Present Illness 70 yo admitted 9/24 from [facility] with low back pain and strep bactermia as well as hypotension. Pt with acute osteomyelitis. PMhx: pt with 3 back sx in [place] in March of this year, chronic hypotension, HFpEF, DM, CAD, OSA, neuropathy   Clinical Impression   Patient admitted for the diagnosis above.  Admitted from a SNF, with the hopes of retuning to SNF.  No acute OT needs identified, discharge pending to SNF.  Recommend OT screen upon return, and Eval/treat as needed.       Recommendations for follow up therapy are one component of a multi-disciplinary discharge planning process, led by the attending physician.  Recommendations may be updated based on patient status, additional functional criteria and insurance authorization.   Follow Up Recommendations  SNF    Equipment Recommendations  None recommended by OT    Recommendations for Other Services       Precautions / Restrictions Precautions Precautions: Other (comment) Precaution Comments: watch BP Restrictions Weight Bearing Restrictions: No      Mobility Bed Mobility Overal bed mobility: Needs Assistance Bed Mobility: Rolling;Supine to Sit;Sit to Supine Rolling: Modified independent (Device/Increase time) Sidelying to sit: Supervision Supine to sit: Mod assist Sit to supine: Mod assist   General bed mobility comments: low back pain that radiates to L thigh. Patient Response: Cooperative  Transfers Overall transfer level: Needs assistance Equipment used: 1 person hand held assist Transfers: Sit to/from [employer] Sit to Stand: Min guard Stand pivot transfers: Min guard       General transfer comment: declined OOB to recliner.    Balance Overall balance assessment: Needs assistance   Sitting balance-Leahy Scale: Good Sitting  balance - Comments: EOB without UE support     Standing balance-Leahy Scale: Fair Standing balance comment: pt able to stand without UE support but reaching out for furniture for pivot and weight shift                           ADL either performed or assessed with clinical judgement   ADL Overall ADL's : At baseline                                             Vision Patient Visual Report: No change from baseline                  Pertinent Vitals/Pain Pain Assessment: Faces Pain Score: 4  Faces Pain Scale: Hurts little more Pain Location: low back pain Pain Descriptors / Indicators: Aching Pain Intervention(s): Limited activity within patient's tolerance;Monitored during session;Repositioned     Hand Dominance Right   Extremity/Trunk Assessment Upper Extremity Assessment Upper Extremity Assessment: Overall WFL for tasks assessed   Lower Extremity Assessment Lower Extremity Assessment: Defer to PT evaluation   Cervical / Trunk Assessment Cervical / Trunk Assessment: Kyphotic   Communication Communication Communication: No difficulties   Cognition Arousal/Alertness: Awake/alert Behavior During Therapy: WFL for tasks assessed/performed Overall Cognitive Status: Within Functional Limits for tasks assessed  Home Living Family/patient expects to be discharged to:: Skilled nursing facility                                        Prior Functioning/Environment Level of Independence: Needs assistance  Gait / Transfers Assistance Needed: SPT from surface to surface without AD.  Has a RW for mobility.  Uses w/c for longer distances. ADL's / Homemaking Assistance Needed: Mod I with ADL completion wheelchair level.  Showers up to 3x/wk with supervision.            OT Problem List: Decreased strength;Impaired balance (sitting and/or standing);Pain       OT Treatment/Interventions:      OT Goals(Current goals can be found in the care plan section) Acute Rehab OT Goals Patient Stated Goal: Looking for an ALF OT Goal Formulation: With patient Time For Goal Achievement: 01/02/21 Potential to Achieve Goals: Fair  OT Frequency:     Barriers to D/C:    None noted       Co-evaluation              AM-PAC OT "6 Clicks" Daily Activity     Outcome Measure Help from another person eating meals?: None Help from another person taking care of personal grooming?: None Help from another person toileting, which includes using toliet, bedpan, or urinal?: A Little Help from another person bathing (including washing, rinsing, drying)?: A Little Help from another person to put on and taking off regular upper body clothing?: None Help from another person to put on and taking off regular lower body clothing?: A Little 6 Click Score: 21   End of Session Nurse Communication: Mobility status  Activity Tolerance: Patient limited by pain Patient left: in bed;with call bell/phone within reach  OT Visit Diagnosis: Unsteadiness on feet (R26.81);Other abnormalities of gait and mobility (R26.89);Muscle weakness (generalized) (M62.81);Pain Pain - Right/Left: Left (low back) Pain - part of body: Leg                Time: 1607-3710 OT Time Calculation (min): 22 min Charges:  OT General Charges $OT Visit: 1 Visit OT Evaluation $OT Eval Moderate Complexity: 1 Mod  01/02/2021  RP, OTR/L  Acute Rehabilitation Services  Office:  [phone]   Suzanna Obey 01/02/2021, 10:42 AM

## 2021-01-02 NOTE — Discharge Summary (Signed)
Physician Discharge Summary  Tonya Baker ZOX:[id number] DOB: 08-03-1950 DOA: 12/28/2020  PCP: Eloisa Northern, MD  Admit date: 12/28/2020 Discharge date: 01/03/2021  Admitted From: Long-term nursing home Disposition: Long-term nursing home  Recommendations for Outpatient Follow-up:  Follow up with PCP in 1-2 weeks   Home Health: Not applicable Equipment/Devices: Not applicable  Discharge Condition: Fair CODE STATUS: Full code Diet recommendation: Low-salt, low-carb diet  Discharge summary: 70 year old female with history of chronic low back pain status post fusion surgery in March 2022 with MRSA infection and subsequent multiple spine surgeries, complicated with 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  Apparently just moved from [place] to [place].  In the emergency room mentating well, was given Percocet in the ER, blood pressures trended down so admitted to ICU with IV fluid boluses and started on Levophed.  She does have chronic hypotension and on midodrine, this was resumed with improvement in blood pressures.   Blood cultures from 9/24 positive for Streptococcus Dysgalactiae 9/26, no growth so far.  Primary source unknown. MRI of the lumbar spine with reactive marrow edema at L5-S1 interspace.  Suspected hardware loosening versus infection however neurosurgery recommended against any procedure.     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  Neurosurgery recommended no surgical treatment.  Adequate medications for pain relief. Treated with penicillin G, ID recommended changing to oral amoxicillin for 2 weeks from negative blood cultures.  Amoxicillin 1 g 3 times a day for 12 more days. 2D echocardiogram with no evidence of endocarditis.  Repeat cultures improved.   Sepsis present on admission, resolved.  Chronic hypotension: On midodrine 10 mg 3 times a day at nursing home.  Transiently required Levophed. Midodrine increased to 30 mg 3 times a day.  Maintain hydration.  Encourage oral intake.  Work with therapies today. Continue to titrate up midodrine at nursing home.   Obstructive sleep apnea: Using CPAP at night.   Acute kidney injury with history of chronic kidney disease stage IIIa: Improved and at about her baseline.   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  On Reglan and gabapentin that is continued.  History of coronary disease: Stable on aspirin and Lipitor.   Mobilize with therapy.  Transfer back to skilled nursing facility to manage her chronic mobility issues. Will prescribe a short course of tramadol for pain relief.  With her intractable back pain, may need prolonged pain medication regimen.   Discharge Diagnoses:  Principal Problem:   Hypotension Active Problems:   Back pain   CKD (chronic kidney disease) stage 3, GFR 30-59 ml/min (HCC)   Spinal stenosis   Obstructive sleep apnea   Sepsis (HCC)   Type 2 diabetes mellitus with diabetic neuropathy, with long-term current use of insulin (HCC)   Volume overload   Hardware complicating wound infection (HCC)   Vertebral osteomyelitis (  HCC)   Bacteremia due to Streptococcus    Discharge Instructions  Discharge Instructions     Call MD for:  severe uncontrolled pain   Complete by: As  directed    Call MD for:  temperature >100.4   Complete by: As directed    Diet Carb Modified   Complete by: As directed    Increase activity slowly   Complete by: As directed       Allergies as of 01/03/2021       Reactions   Cortisone Other (See Comments)   Unknown reaction   Hydrocortisone Other (See Comments)   Unknown reaction   Latex Other (See Comments)   Unknown reaction        Medication List     STOP taking these medications    Oxycodone HCl 10 MG Tabs       TAKE these medications    acetaminophen 325 MG tablet Commonly known as: TYLENOL Take 650 mg by mouth every 6 (six) hours as needed (pain).   albuterol (2.5 MG/3ML) 0.083% nebulizer solution Commonly known as: PROVENTIL Take 2.5 mg by nebulization every 8 (eight) hours as needed (congestion).   amoxicillin 500 MG tablet Commonly known as: AMOXIL Take 2 tablets (1,000 mg total) by mouth in the morning, at noon, and at bedtime for 12 days.   aspirin 81 MG chewable tablet Chew 81 mg by mouth every morning.   atorvastatin 40 MG tablet Commonly known as: LIPITOR Take 40 mg by mouth at bedtime.   bisacodyl 10 MG suppository Commonly known as: DULCOLAX Place 10 mg rectally daily as needed (constipation).   cholecalciferol 25 MCG (1000 UNIT) tablet Commonly known as: VITAMIN D3 Take 1,000 Units by mouth every morning.   cyclobenzaprine 10 MG tablet Commonly known as: FLEXERIL Take 10 mg by mouth 3 (three) times daily.   docusate sodium 100 MG capsule Commonly known as: COLACE Take 100 mg by mouth every morning.   doxycycline 100 MG capsule Commonly known as: VIBRAMYCIN Take 100 mg by mouth every 12 (twelve) hours. Stat date: 10/03/20   eucerin cream Apply 1 application topically daily.   ferrous sulfate 325 (65 FE) MG tablet Take 325 mg by mouth every morning.   Fleet Oil enema Generic drug: mineral oil Place 1 enema rectally daily as needed (impaction).   furosemide 40 MG  tablet Commonly known as: LASIX Take 40 mg by mouth every morning.   gabapentin 300 MG capsule Commonly known as: NEURONTIN Take 600 mg by mouth 3 (three) times daily.   insulin detemir 100 UNIT/ML injection Commonly known as: LEVEMIR Inject 60 Units into the skin every morning.   magnesium citrate Soln Take 30 mLs by mouth every 12 (twelve) hours as needed (constipation).   metoCLOPramide 5 MG tablet Commonly known as: REGLAN Take 5 mg by mouth 4 (four) times daily -  before meals and at bedtime.   midodrine 10 MG tablet Commonly known as: PROAMATINE Take 3 tablets (30 mg total) by mouth 3 (three) times daily. What changed: how much to take   multivitamin with minerals Tabs tablet Take 1 tablet by mouth every morning.   NovoLOG FlexPen 100 UNIT/ML FlexPen Generic drug: insulin aspart Inject 0-12 Units into the skin See admin instructions. Inject 0-12 units subcutaneously three times daily per sliding scale:  CBG 70-150 0 units, 151-200 2 units, 201-250 4 units, 251-300 6 units, 301-350 8 units, 351-400 10 units, >400 12 units and call MD   pantoprazole 40  MG tablet Commonly known as: PROTONIX Take 40 mg by mouth 2 (two) times daily.   polyethylene glycol 17 g packet Commonly known as: MIRALAX / GLYCOLAX Take 17 g by mouth every morning.   potassium chloride SA 20 MEQ tablet Commonly known as: KLOR-CON Take 20 mEq by mouth every morning.   QUEtiapine 25 MG tablet Commonly known as: SEROQUEL Take 25 mg by mouth at bedtime.   sertraline 100 MG tablet Commonly known as: ZOLOFT Take 100 mg by mouth every morning. What changed: Another medication with the same name was removed. Continue taking this medication, and follow the directions you see here.   traMADol 50 MG tablet Commonly known as: ULTRAM Take 0.5 tablets (25 mg total) by mouth every 6 (six) hours as needed for up to 5 days for severe pain.   Trulicity 0.75 MG/0.5ML Sopn Generic drug: Dulaglutide Inject  0.75 mg into the skin every Monday.        Follow-up Information     Eloisa Northern, MD Follow up in 2 week(s).   Specialty: Internal Medicine Contact information: [address] [place] [address] [phone]                Allergies  Allergen Reactions   Cortisone Other (See Comments)    Unknown reaction   Hydrocortisone Other (See Comments)    Unknown reaction   Latex Other (See Comments)    Unknown reaction    Consultations: PCCM Neurosurgery Infectious disease   Procedures/Studies: CT Lumbar Spine Wo Contrast  Result Date: 12/28/2020 CLINICAL DATA:  Low back pain, no red flags; lumbar pain; no recent injury; recent surgery in March. EXAM: CT LUMBAR SPINE WITHOUT CONTRAST TECHNIQUE: Multidetector CT imaging of the lumbar spine was performed without intravenous contrast administration. Multiplanar CT image reconstructions were also generated. COMPARISON:  No pertinent prior exams available for comparison. FINDINGS: Segmentation: 5 lumbar vertebrae. The caudal most well-formed intervertebral disc space is designated L5-S1. Alignment: Trace L3-L4 grade 1 retrolisthesis. Trace L5-S1 grade 1 retrolisthesis. Vertebrae: Mild age-indeterminate anterior wedge deformity of the T11 vertebral body. Mild age-indeterminate L2 inferior endplate compression deformity. Age-indeterminate L3 anterior wedge vertebral compression deformity (40 % height loss). A posterior spinal fusion construct extends from the L1 level to the S1 level. Interbody devices at L4-L5 and L5-S1. No appreciable osseous fusion across the L4-L5 and L5-S1 disc spaces. Ventral fusion hardware at L4-L5. There is lucency surrounding the screws within the bilateral sacral ala compatible with hardware loosening. Paraspinal and other soft tissues: Aortic atherosclerosis. Postsurgical changes to the lumbar paraspinal soft tissues. Disc levels: Multilevel disc space narrowing. Multilevel disc space narrowing. Most notably,  there is advanced disc space narrowing on the right at L1-L2 and moderate disc space narrowing at L2-L3 and L3-L4. Multilevel bridging ventrolateral osteophytes. Additionally, there is apparent fusion across the right L1-L2 disc space, and apparent fusion across the bilateral disc spaces at L2-L3 and L3-L4. Streak and beam hardening artifact arising from the posterior spinal fusion construct significantly limits evaluation of the spinal canal and neural foramina at multiple levels. L1-L2: No appreciable significant spinal canal or foraminal stenosis. L2-L3: Suspected post laminotomy changes. Streak and beam hardening artifact precludes adequate evaluation of the spinal canal. No appreciable high-grade neural foraminal narrowing. L3-L4: Post-laminectomy changes. Disc bulge with endplate spurring. Streak and beam hardening artifact precludes adequate evaluation of the spinal canal. Bilateral neural foraminal narrowing (moderate right, mild left). L4-L5: Post-laminectomy changes. Streak and beam hardening artifact precludes adequate evaluation  of the spinal canal. No appreciable significant foraminal stenosis. L5-S1: Streak and beam hardening artifact precludes adequate evaluation of the spinal canal. A disc bulge results in apparent bilateral neural foraminal narrowing (mild right, moderate left). IMPRESSION: Mild age-indeterminate anterior wedge vertebral compression deformity of the T11 vertebral body. Mild age-indeterminate L2 inferior endplate vertebral compression deformity. Age-indeterminate L3 anterior wedge vertebral compression deformity (40% height loss). Lumbar spondylosis and postoperative changes, with multilevel spinal fusion, as detailed. Streak and beam hardening artifact arising from spinal fusion hardware precludes evaluation of the spinal canal at multiple levels. Sites of mild and moderate foraminal stenosis, as detailed. Lucency surrounding the screws within the bilateral sacral ala compatible  with hardware loosening. Electronically Signed   By: Jackey Loge D.O.   On: 12/28/2020 16:21   MR THORACIC SPINE WO CONTRAST  Result Date: 12/28/2020 CLINICAL DATA:  Initial evaluation for acute mid back pain. EXAM: MRI THORACIC SPINE WITHOUT CONTRAST TECHNIQUE: Multiplanar, multisequence MR imaging of the thoracic spine was performed. No intravenous contrast was administered. COMPARISON:  None available. FINDINGS: Alignment: Examination technically limited by extensive motion artifact. Additionally, no sagittal T1 weighted sequence available for review at time of this dictation. Trace retrolisthesis of T11 on T12. Straightening of the normal thoracic kyphosis elsewhere. Vertebrae: Vertebral body height maintained without acute or chronic fracture. Susceptibility artifact related to prior fusion partially visualized at the upper lumbar spine. Underlying bone marrow signal intensity within normal limits. Benign hemangiomata noted within the T7 and T12 vertebral bodies. No worrisome osseous lesions. No abnormal marrow edema. Cord: Grossly normal signal and morphology on this motion degraded exam. Paraspinal and other soft tissues: Chronic postoperative scarring noted within the posterior paraspinous soft tissues at the partially visualized thoracolumbar junction. Paraspinous soft tissues demonstrate no acute finding. Disc levels: Degenerative spondylosis noted within the partially visualized cervical spine, incompletely assessed on this exam. T1-2:  Unremarkable. T2-3: Negative interspace.  Mild facet hypertrophy.  No stenosis. T3-4:  Negative interspace.  Mild facet hypertrophy.  No stenosis. T4-5: Negative interspace. Left-sided facet hypertrophy. No significant stenosis. T5-6: Negative interspace. Mild to moderate facet hypertrophy. No significant spinal stenosis. Moderate bilateral foraminal narrowing. T6-7: Minimal disc bulge with reactive endplate spurring. Bilateral facet hypertrophy. No spinal stenosis.  Mild bilateral foraminal narrowing. T7-8: Minimal reactive endplate change without significant disc bulge. Bilateral facet arthrosis. No spinal stenosis. Mild bilateral foraminal narrowing. T8-9: Negative interspace. Bilateral facet arthrosis. No spinal stenosis. Mild-to-moderate bilateral foraminal narrowing. T9-10: Negative interspace. Bilateral facet hypertrophy. No significant stenosis. T10-11: Mild disc bulge with reactive endplate change. Bilateral facet hypertrophy. No spinal stenosis. Foramina remain patent. T11-12: Minimal disc bulge with bilateral facet hypertrophy. No spinal stenosis. Foramina remain grossly patent. T12-L1: Mild disc bulge with bilateral facet hypertrophy. No significant stenosis. IMPRESSION: 1. Technically limited exam due to extensive motion artifact. 2. No acute abnormality within the thoracic spine. No significant disc pathology or spinal stenosis. 3. Multilevel facet hypertrophy throughout the thoracic spine as detailed above. Findings could contribute to underlying back pain. Associated mild to moderate bilateral foraminal narrowing at T5-6 through T8-9. Electronically Signed   By: Rise Mu M.D.   On: 12/28/2020 20:20   MR LUMBAR SPINE WO CONTRAST  Result Date: 12/28/2020 CLINICAL DATA:  Initial evaluation for acute lower back pain, bilateral leg weakness for 2 days. EXAM: MRI LUMBAR SPINE WITHOUT CONTRAST TECHNIQUE: Multiplanar, multisequence MR imaging of the lumbar spine was performed. No intravenous contrast was administered. COMPARISON:  Prior CT from earlier the same day. FINDINGS:  Segmentation: Examination moderately to severely degraded by motion artifact. Standard segmentation. Lowest well-formed disc space labeled the L5-S1 level. Alignment: Straightening with reversal of the normal cervical lordosis. Trace chronic retrolisthesis of L1 onto through L3 on L4, with additional trace retrolisthesis of T11 on T12 and T12 on L1. Vertebrae: Extensive  susceptibility artifact from prior posterior fusion at L1 through the sacrum, with bilateral transpedicular screws in place at all levels. Interbody with anterior fusion in place at L4-5, with additional interbody fusion at L5-S1. Hardware better evaluated on prior CT. Chronic wedging deformity of the L3 vertebral body noted. Vertebral body height otherwise grossly maintained without definite acute fracture. Bone marrow signal intensity diffusely heterogeneous. Benign hemangioma noted within the T12 vertebral body. No worrisome osseous lesions on this motion degraded exam. There is reactive marrow edema about the L5-S1 interspace, favored to be related to prior surgery and/or the hardware loosening as seen on prior CT. Possible infection/osteomyelitis difficult to exclude. Otherwise, no other convincing abnormal marrow edema. Conus medullaris and cauda equina: Conus extends to the T12-L1 level. Conus medullaris grossly within normal limits on this motion degraded exam. Nerve roots of the cauda equina grossly unremarkable, although evaluation limited by motion. Paraspinal and other soft tissues: Extensive postoperative changes present throughout the posterior paraspinous soft tissues. No visible collections. Disc levels: L1-2: Degenerative intervertebral disc space narrowing with diffuse disc bulge and disc desiccation. Prior posterior fusion. Residual facet hypertrophy. No spinal stenosis. Foramina appear patent. L2-3: Advanced degenerative intervertebral disc space narrowing with mild diffuse disc bulge and reactive endplate spurring. Prior posterior fusion and decompression. Moderate facet hypertrophy. Thecal sac remains patent. Residual moderate left L2 foraminal narrowing. Right neural foramen appears patent. L3-4: Advanced degenerative intervertebral disc space narrowing with partial ankylosis across the L3-4 interspace. Chronic disc bulge with reactive endplate spurring. Prior posterior fusion and  decompression. Moderate right worse than left facet arthrosis. Residual mild to moderate left lateral recess stenosis. Central canal remains patent. Mild bilateral L3 foraminal narrowing. L4-5: Prior posterior and interbody fusion with posterior decompression. No residual spinal stenosis. Foramina appear grossly patent. L5-S1: Prior posterior and interbody fusion with posterior decompression. No residual spinal stenosis. Foramina appear grossly patent. IMPRESSION: 1. Limited exam due to extensive motion artifact. 2. Postoperative changes from prior posterior fusion with decompression at L1 through the sacrum. Residual mild to moderate left lateral recess narrowing at the L3-4 level. No other significant residual spinal stenosis. 3. Reactive marrow edema about the L5-S1 interspace, favored to be related to prior surgery and/or hardware loosening as seen on prior CT. Possible infection/osteomyelitis difficult to exclude, and could be considered in the correct clinical setting. Correlation with symptomatology and laboratory values recommended. No visible collections. No other convincing evidence for acute infection within the lumbar spine. 4. Residual mild-to-moderate left L2 and bilateral L3 foraminal stenosis as above. Electronically Signed   By: Rise Mu M.D.   On: 12/28/2020 20:01   DG Chest Port 1 View  Result Date: 12/28/2020 CLINICAL DATA:  Back pain EXAM: PORTABLE CHEST 1 VIEW COMPARISON:  12/28/2020 FINDINGS: Low lung volumes. Normal cardiac silhouette. No focal opacity, pleural effusion or pneumothorax. IMPRESSION: No active disease. Electronically Signed   By: Jasmine Pang M.D.   On: 12/28/2020 21:08   DG Chest Port 1 View  Result Date: 12/28/2020 CLINICAL DATA:  Questionable sepsis. EXAM: PORTABLE CHEST 1 VIEW COMPARISON:  Chest x-ray 10/19/2020 FINDINGS: Slightly more prominent cardiomediastinal silhouette likely due to low lung volumes and patient rotation. Prior hilar  vasculature  likely due to low lung volumes. Low lung volumes. No focal consolidation. No pulmonary edema. No pleural effusion. No pneumothorax. No acute osseous abnormality. IMPRESSION: 1. Slightly more prominent cardiomediastinal silhouette likely due to low lung volumes and patient rotation. Recommend repeat frontal view. 2. Otherwise no acute cardiopulmonary abnormality. Electronically Signed   By: Tish Frederickson M.D.   On: 12/28/2020 20:31   ECHOCARDIOGRAM COMPLETE  Result Date: 12/29/2020    ECHOCARDIOGRAM REPORT   Patient Name:   [facility]  Date of Exam: 12/29/2020 Medical Rec #:  [id number]  Height:       64.0 in Accession #:    [id number] Weight:       207.0 lb Date of Birth:  [birth year]  BSA:          1.985 m Patient Age:    70 years   BP:           78/50 mmHg Patient Gender: F          HR:           79 bpm. Exam Location:  Inpatient Procedure: 2D Echo, Intracardiac Opacification Agent, Cardiac Doppler and Color            Doppler Indications:    Hypotension.; R06.02 SOB  History:        Patient has no prior history of Echocardiogram examinations.                 Risk Factors:Sleep Apnea and Dyslipidemia.  Sonographer:    Sheralyn Boatman RDCS Referring Phys: [id number] Ivor Costa [facility]  Sonographer Comments: Technically difficult study due to poor echo windows and patient is morbidly obese. Image acquisition challenging due to patient body habitus. IMPRESSIONS  1. Left ventricular ejection fraction, by estimation, is 60 to 65%. The left ventricle has normal function. The left ventricle has no regional wall motion abnormalities. The left ventricular internal cavity size was mildly dilated. There is mild left ventricular hypertrophy. Left ventricular diastolic parameters are consistent with Grade II diastolic dysfunction (pseudonormalization).  2. Right ventricular systolic function is normal. The right ventricular size is normal. There is normal pulmonary artery systolic pressure. The estimated right ventricular systolic  pressure is 35.9 mmHg.  3. The mitral valve is normal in structure. No evidence of mitral valve regurgitation. No evidence of mitral stenosis.  4. The aortic valve is normal in structure. Aortic valve regurgitation is not visualized. No aortic stenosis is present.  5. The inferior vena cava is dilated in size with >50% respiratory variability, suggesting right atrial pressure of 8 mmHg. FINDINGS  Left Ventricle: Left ventricular ejection fraction, by estimation, is 60 to 65%. The left ventricle has normal function. The left ventricle has no regional wall motion abnormalities. The left ventricular internal cavity size was mildly dilated. There is  mild left ventricular hypertrophy. Left ventricular diastolic parameters are consistent with Grade II diastolic dysfunction (pseudonormalization). Right Ventricle: The right ventricular size is normal. No increase in right ventricular wall thickness. Right ventricular systolic function is normal. There is normal pulmonary artery systolic pressure. The tricuspid regurgitant velocity is 2.64 m/s, and  with an assumed right atrial pressure of 8 mmHg, the estimated right ventricular systolic pressure is 35.9 mmHg. Left Atrium: Left atrial size was normal in size. Right Atrium: Right atrial size was normal in size. Pericardium: There is no evidence of pericardial effusion. Mitral Valve: The mitral valve is normal in structure. No evidence of mitral valve regurgitation. No evidence of mitral  valve stenosis. Tricuspid Valve: The tricuspid valve is normal in structure. Tricuspid valve regurgitation is mild . No evidence of tricuspid stenosis. Aortic Valve: The aortic valve is normal in structure. Aortic valve regurgitation is not visualized. No aortic stenosis is present. Pulmonic Valve: The pulmonic valve was normal in structure. Pulmonic valve regurgitation is not visualized. No evidence of pulmonic stenosis. Aorta: The aortic root is normal in size and structure. Venous: The  inferior vena cava is dilated in size with greater than 50% respiratory variability, suggesting right atrial pressure of 8 mmHg. IAS/Shunts: No atrial level shunt detected by color flow Doppler.  LEFT VENTRICLE PLAX 2D LVIDd:         5.60 cm     Diastology LVIDs:         3.50 cm     LV e' medial:    6.09 cm/s LV PW:         1.50 cm     LV E/e' medial:  18.7 LV IVS:        1.00 cm     LV e' lateral:   7.18 cm/s LVOT diam:     2.00 cm     LV E/e' lateral: 15.9 LV SV:         66 LV SV Index:   33 LVOT Area:     3.14 cm  LV Volumes (MOD) LV vol d, MOD A2C: 49.4 ml LV vol d, MOD A4C: 59.3 ml LV vol s, MOD A2C: 15.1 ml LV vol s, MOD A4C: 26.8 ml LV SV MOD A2C:     34.3 ml LV SV MOD A4C:     59.3 ml LV SV MOD BP:      36.5 ml RIGHT VENTRICLE             IVC RV S prime:     11.90 cm/s  IVC diam: 2.10 cm TAPSE (M-mode): 2.1 cm LEFT ATRIUM             Index       RIGHT ATRIUM           Index LA diam:        4.60 cm 2.32 cm/m  RA Area:     11.80 cm LA Vol (A2C):   32.6 ml 16.42 ml/m RA Volume:   24.70 ml  12.44 ml/m LA Vol (A4C):   35.4 ml 17.83 ml/m LA Biplane Vol: 35.4 ml 17.83 ml/m  AORTIC VALVE LVOT Vmax:   120.00 cm/s LVOT Vmean:  79.500 cm/s LVOT VTI:    0.209 m  AORTA Ao Root diam: 3.20 cm Ao Asc diam:  3.80 cm MITRAL VALVE                TRICUSPID VALVE MV Area (PHT): 3.85 cm     TR Peak grad:   27.9 mmHg MV Decel Time: 197 msec     TR Vmax:        264.00 cm/s MV E velocity: 114.00 cm/s MV A velocity: 96.00 cm/s   SHUNTS MV E/A ratio:  1.19         Systemic VTI:  0.21 m                             Systemic Diam: 2.00 cm Donato Schultz MD Electronically signed by Donato Schultz MD Signature Date/Time: 12/29/2020/11:19:41 AM    Final    (Echo, Carotid, EGD, Colonoscopy, ERCP)    Subjective:  seen and examined . No overnight events . Has low back pain persistent . Ready to go to SNF.  Discharge Exam: Vitals:   01/03/21 0740 01/03/21 0800  BP:  (!) 89/58  Pulse:  77  Resp:  (!) 24  Temp: (!) 97.5 F (36.4 C)    SpO2:  97%   Vitals:   01/03/21 0500 01/03/21 0600 01/03/21 0740 01/03/21 0800  BP:  (!) 93/56  (!) 89/58  Pulse:  72  77  Resp:  14  (!) 24  Temp:   (!) 97.5 F (36.4 C)   TempSrc:   Oral   SpO2:  95%  97%  Weight: 85.3 kg     Height:        General: Pt is alert, awake, not in acute distress Chronically sick looking and debilitated.  Not in any distress. She has deformed ankle joints, left ankle joint with deformity.decreased sensation left leg.  Cardiovascular: RRR, S1/S2 +, no rubs, no gallops Respiratory: CTA bilaterally, no wheezing, no rhonchi Abdominal: Soft, NT, ND, bowel sounds + Extremities: no edema, no cyanosis    The results of significant diagnostics from this hospitalization (including imaging, microbiology, ancillary and laboratory) are listed below for reference.     Microbiology: Recent Results (from the past 240 hour(s))  Blood culture (routine single)     Status: Abnormal   Collection Time: 12/28/20  6:00 PM   Specimen: BLOOD  Result Value Ref Range Status   Specimen Description   Final    BLOOD RIGHT ANTECUBITAL Performed at [facility], [address] [place], [place] [address]    Special Requests   Final    BOTTLES DRAWN AEROBIC AND ANAEROBIC Blood Culture adequate volume Performed at [facility], [address], [place], [place] [address]    Culture  Setup Time   Final    GRAM POSITIVE COCCI IN CHAINS IN PAIRS IN BOTH AEROBIC AND ANAEROBIC BOTTLES CRITICAL RESULT CALLED TO, READ BACK BY AND VERIFIED WITH: J,MILLEN PHARMD @1305  12/29/20 EB Performed at [facility], [address], [place], [place]    Culture STREPTOCOCCUS DYSGALACTIAE (A)  Final   Report Status 12/31/2020 FINAL  Final   Organism ID, Bacteria STREPTOCOCCUS DYSGALACTIAE  Final      Susceptibility   Streptococcus dysgalactiae - MIC*    PENICILLIN <=0.06 SENSITIVE Sensitive     CEFTRIAXONE <=0.12 SENSITIVE Sensitive      ERYTHROMYCIN <=0.12 SENSITIVE Sensitive     LEVOFLOXACIN 0.5 SENSITIVE Sensitive     VANCOMYCIN 0.5 SENSITIVE Sensitive     * STREPTOCOCCUS DYSGALACTIAE  Blood Culture ID Panel (Reflexed)     Status: Abnormal   Collection Time: 12/28/20  6:00 PM  Result Value Ref Range Status   Enterococcus faecalis NOT DETECTED NOT DETECTED Final   Enterococcus Faecium NOT DETECTED NOT DETECTED Final   Listeria monocytogenes NOT DETECTED NOT DETECTED Final   Staphylococcus species NOT DETECTED NOT DETECTED Final   Staphylococcus aureus (BCID) NOT DETECTED NOT DETECTED Final   Staphylococcus epidermidis NOT DETECTED NOT DETECTED Final   Staphylococcus lugdunensis NOT DETECTED NOT DETECTED Final   Streptococcus species DETECTED (A) NOT DETECTED Final    Comment: Not Enterococcus species, Streptococcus agalactiae, Streptococcus pyogenes, or Streptococcus pneumoniae. CRITICAL RESULT CALLED TO, READ BACK BY AND VERIFIED WITH: J,MILLEN PHARMD @1305  12/29/20 EB    Streptococcus agalactiae NOT DETECTED NOT DETECTED Final   Streptococcus pneumoniae NOT DETECTED NOT DETECTED Final   Streptococcus pyogenes  NOT DETECTED NOT DETECTED Final   A.calcoaceticus-baumannii NOT DETECTED NOT DETECTED Final   Bacteroides fragilis NOT DETECTED NOT DETECTED Final   Enterobacterales NOT DETECTED NOT DETECTED Final   Enterobacter cloacae complex NOT DETECTED NOT DETECTED Final   Escherichia coli NOT DETECTED NOT DETECTED Final   Klebsiella aerogenes NOT DETECTED NOT DETECTED Final   Klebsiella oxytoca NOT DETECTED NOT DETECTED Final   Klebsiella pneumoniae NOT DETECTED NOT DETECTED Final   Proteus species NOT DETECTED NOT DETECTED Final   Salmonella species NOT DETECTED NOT DETECTED Final   Serratia marcescens NOT DETECTED NOT DETECTED Final   Haemophilus influenzae NOT DETECTED NOT DETECTED Final   Neisseria meningitidis NOT DETECTED NOT DETECTED Final   Pseudomonas aeruginosa NOT DETECTED NOT DETECTED Final    Stenotrophomonas maltophilia NOT DETECTED NOT DETECTED Final   Candida albicans NOT DETECTED NOT DETECTED Final   Candida auris NOT DETECTED NOT DETECTED Final   Candida glabrata NOT DETECTED NOT DETECTED Final   Candida krusei NOT DETECTED NOT DETECTED Final   Candida parapsilosis NOT DETECTED NOT DETECTED Final   Candida tropicalis NOT DETECTED NOT DETECTED Final   Cryptococcus neoformans/gattii NOT DETECTED NOT DETECTED Final    Comment: Performed at [facility], [address] [place], [place] [address]  Resp Panel by RT-PCR (Flu A&B, Covid) Nasopharyngeal Swab     Status: None   Collection Time: 12/28/20  6:08 PM   Specimen: Nasopharyngeal Swab; Nasopharyngeal(NP) swabs in vial transport medium  Result Value Ref Range Status   SARS Coronavirus 2 by RT PCR NEGATIVE NEGATIVE Final    Comment: (NOTE) SARS-CoV-2 target nucleic acids are NOT DETECTED.  The SARS-CoV-2 RNA is generally detectable in upper respiratory specimens during the acute phase of infection. The lowest concentration of SARS-CoV-2 viral copies this assay can detect is 138 copies/mL. A negative result does not preclude SARS-Cov-2 infection and should not be used as the sole basis for treatment or other patient management decisions. A negative result may occur with  improper specimen collection/handling, submission of specimen other than nasopharyngeal swab, presence of viral mutation(s) within the areas targeted by this assay, and inadequate number of viral copies(<138 copies/mL). A negative result must be combined with clinical observations, patient history, and epidemiological information. The expected result is Negative.  Fact Sheet for Patients:  BloggerCourse.com  Fact Sheet for Healthcare Providers:  SeriousBroker.it  This test is no t yet approved or cleared by the [place] FDA and  has been authorized for detection and/or diagnosis of  SARS-CoV-2 by FDA under an Emergency Use Authorization (EUA). This EUA will remain  in effect (meaning this test can be used) for the duration of the COVID-19 declaration under Section 564(b)(1) of the Act, 21 U.S.C.section 360bbb-3(b)(1), unless the authorization is terminated  or revoked sooner.       Influenza A by PCR NEGATIVE NEGATIVE Final   Influenza B by PCR NEGATIVE NEGATIVE Final    Comment: (NOTE) The Xpert Xpress SARS-CoV-2/FLU/RSV plus assay is intended as an aid in the diagnosis of influenza from Nasopharyngeal swab specimens and should not be used as a sole basis for treatment. Nasal washings and aspirates are unacceptable for Xpert Xpress SARS-CoV-2/FLU/RSV testing.  Fact Sheet for Patients: BloggerCourse.com  Fact Sheet for Healthcare Providers: SeriousBroker.it  This test is not yet approved or cleared by the [place] FDA and has been authorized for detection and/or diagnosis of SARS-CoV-2 by FDA under an Emergency Use Authorization (EUA). This EUA will remain in  effect (meaning this test can be used) for the duration of the COVID-19 declaration under Section 564(b)(1) of the Act, 21 U.S.C. section 360bbb-3(b)(1), unless the authorization is terminated or revoked.  Performed at [facility], [address] [place], [place] [address]   Culture, blood (routine x 2)     Status: Abnormal   Collection Time: 12/28/20 10:30 PM   Specimen: BLOOD  Result Value Ref Range Status   Specimen Description   Final    BLOOD RIGHT ANTECUBITAL Performed at [facility], [address], [place], [place] [address]    Special Requests   Final    BOTTLES DRAWN AEROBIC AND ANAEROBIC Blood Culture adequate volume Performed at [facility], [address] [place], [place] [address]    Culture  Setup Time   Final    GRAM POSITIVE COCCI IN BOTH AEROBIC AND ANAEROBIC  BOTTLES CRITICAL VALUE NOTED.  VALUE IS CONSISTENT WITH PREVIOUSLY REPORTED AND CALLED VALUE.    Culture (A)  Final    STREPTOCOCCUS DYSGALACTIAE SUSCEPTIBILITIES PERFORMED ON PREVIOUS CULTURE WITHIN THE LAST 5 DAYS. Performed at [facility], [address], [place], [place] [address]    Report Status 12/31/2020 FINAL  Final  MRSA Next Gen by PCR, Nasal     Status: None   Collection Time: 12/29/20 12:20 AM   Specimen: Nasal Mucosa; Nasal Swab  Result Value Ref Range Status   MRSA by PCR Next Gen NOT DETECTED NOT DETECTED Final    Comment: (NOTE) The GeneXpert MRSA Assay (FDA approved for NASAL specimens only), is one component of a comprehensive MRSA colonization surveillance program. It is not intended to diagnose MRSA infection nor to guide or monitor treatment for MRSA infections. Test performance is not FDA approved in patients less than 9 years old. Performed at [facility], [address], [place], [place] [address]   Culture, blood (routine x 2)     Status: None (Preliminary result)   Collection Time: 12/30/20 11:21 AM   Specimen: BLOOD RIGHT HAND  Result Value Ref Range Status   Specimen Description BLOOD RIGHT HAND  Final   Special Requests   Final    BOTTLES DRAWN AEROBIC AND ANAEROBIC Blood Culture adequate volume   Culture   Final    NO GROWTH 4 DAYS Performed at [facility], [address] [place], [place] [address]    Report Status PENDING  Incomplete  Culture, blood (routine x 2)     Status: None (Preliminary result)   Collection Time: 12/30/20 11:21 AM   Specimen: BLOOD LEFT HAND  Result Value Ref Range Status   Specimen Description BLOOD LEFT HAND  Final   Special Requests   Final    AEROBIC BOTTLE ONLY Blood Culture results may not be optimal due to an inadequate volume of blood received in culture bottles   Culture   Final    NO GROWTH 4 DAYS Performed at [facility], [address] [place], [place] [address]    Report  Status PENDING  Incomplete  Resp Panel by RT-PCR (Flu A&B, Covid) Nasopharyngeal Swab     Status: None   Collection Time: 01/02/21  6:28 PM   Specimen: Nasopharyngeal Swab; Nasopharyngeal(NP) swabs in vial transport medium  Result Value Ref Range Status   SARS Coronavirus 2 by RT PCR NEGATIVE NEGATIVE Final    Comment: (NOTE) SARS-CoV-2 target nucleic acids are NOT DETECTED.  The SARS-CoV-2 RNA is  generally detectable in upper respiratory specimens during the acute phase of infection. The lowest concentration of SARS-CoV-2 viral copies this assay can detect is 138 copies/mL. A negative result does not preclude SARS-Cov-2 infection and should not be used as the sole basis for treatment or other patient management decisions. A negative result may occur with  improper specimen collection/handling, submission of specimen other than nasopharyngeal swab, presence of viral mutation(s) within the areas targeted by this assay, and inadequate number of viral copies(<138 copies/mL). A negative result must be combined with clinical observations, patient history, and epidemiological information. The expected result is Negative.  Fact Sheet for Patients:  BloggerCourse.com  Fact Sheet for Healthcare Providers:  SeriousBroker.it  This test is no t yet approved or cleared by the [place] FDA and  has been authorized for detection and/or diagnosis of SARS-CoV-2 by FDA under an Emergency Use Authorization (EUA). This EUA will remain  in effect (meaning this test can be used) for the duration of the COVID-19 declaration under Section 564(b)(1) of the Act, 21 U.S.C.section 360bbb-3(b)(1), unless the authorization is terminated  or revoked sooner.       Influenza A by PCR NEGATIVE NEGATIVE Final   Influenza B by PCR NEGATIVE NEGATIVE Final    Comment: (NOTE) The Xpert Xpress SARS-CoV-2/FLU/RSV plus assay is intended as an aid in the diagnosis  of influenza from Nasopharyngeal swab specimens and should not be used as a sole basis for treatment. Nasal washings and aspirates are unacceptable for Xpert Xpress SARS-CoV-2/FLU/RSV testing.  Fact Sheet for Patients: BloggerCourse.com  Fact Sheet for Healthcare Providers: SeriousBroker.it  This test is not yet approved or cleared by the [place] FDA and has been authorized for detection and/or diagnosis of SARS-CoV-2 by FDA under an Emergency Use Authorization (EUA). This EUA will remain in effect (meaning this test can be used) for the duration of the COVID-19 declaration under Section 564(b)(1) of the Act, 21 U.S.C. section 360bbb-3(b)(1), unless the authorization is terminated or revoked.  Performed at [facility], [address] [place], [place] [address]      Labs: BNP (last 3 results) Recent Labs    12/28/20 2331  BNP 577.5*   Basic Metabolic Panel: Recent Labs  Lab 12/29/20 0132 12/30/20 1121 12/31/20 1332 01/01/21 0233 01/02/21 0209  NA 135 135 136 133* 136  K 4.1 3.0* 3.4* 3.4* 4.0  CL 100 99 101 102 103  CO2 21* 26 28 24 24   GLUCOSE 94 242* 112* 163* 141*  BUN 20 15 9 9 8   CREATININE 1.28* 1.22* 0.90 0.83 0.84  CALCIUM 8.6* 8.3* 8.7* 8.2* 8.9  MG 1.5* 2.1  --   --   --    Liver Function Tests: Recent Labs  Lab 12/28/20 1707 12/30/20 1121  AST 19 15  ALT 14 12  ALKPHOS 137* 129*  BILITOT 1.3* 0.8  PROT 6.2* 6.1*  ALBUMIN 2.3* 1.9*   No results for input(s): LIPASE, AMYLASE in the last 168 hours. No results for input(s): AMMONIA in the last 168 hours. CBC: Recent Labs  Lab 12/28/20 1613 12/28/20 2331 12/29/20 0132 12/30/20 1121 12/31/20 1332 01/01/21 0233 01/02/21 0209  WBC 13.9*   < > 14.9* 9.8 13.9* 10.1 13.4*  NEUTROABS 12.2*  --   --  8.0* 11.2* 6.9 9.6*  HGB 12.6   < > 12.5 10.6* 12.1 10.4* 11.5*  HCT 39.2   < > 38.2 32.8* 37.3 31.8* 35.2*  MCV 93.1   < > 92.0  91.9  91.2 91.1 91.4  PLT 155   < > 121* 187 268 222 318   < > = values in this interval not displayed.   Cardiac Enzymes: No results for input(s): CKTOTAL, CKMB, CKMBINDEX, TROPONINI in the last 168 hours. BNP: Invalid input(s): POCBNP CBG: Recent Labs  Lab 01/02/21 1513 01/02/21 1930 01/02/21 2318 01/03/21 0340 01/03/21 0738  GLUCAP 170* 151* 162* 234* 252*   D-Dimer No results for input(s): DDIMER in the last 72 hours. Hgb A1c No results for input(s): HGBA1C in the last 72 hours. Lipid Profile No results for input(s): CHOL, HDL, LDLCALC, TRIG, CHOLHDL, LDLDIRECT in the last 72 hours. Thyroid function studies No results for input(s): TSH, T4TOTAL, T3FREE, THYROIDAB in the last 72 hours.  Invalid input(s): FREET3 Anemia work up No results for input(s): VITAMINB12, FOLATE, FERRITIN, TIBC, IRON, RETICCTPCT in the last 72 hours. Urinalysis    Component Value Date/Time   COLORURINE YELLOW 12/29/2020 0019   APPEARANCEUR HAZY (A) 12/29/2020 0019   LABSPEC 1.014 12/29/2020 0019   PHURINE 6.0 12/29/2020 0019   GLUCOSEU NEGATIVE 12/29/2020 0019   HGBUR SMALL (A) 12/29/2020 0019   BILIRUBINUR NEGATIVE 12/29/2020 0019   KETONESUR NEGATIVE 12/29/2020 0019   PROTEINUR 30 (A) 12/29/2020 0019   NITRITE POSITIVE (A) 12/29/2020 0019   LEUKOCYTESUR SMALL (A) 12/29/2020 0019   Sepsis Labs Invalid input(s): PROCALCITONIN,  WBC,  LACTICIDVEN Microbiology Recent Results (from the past 240 hour(s))  Blood culture (routine single)     Status: Abnormal   Collection Time: 12/28/20  6:00 PM   Specimen: BLOOD  Result Value Ref Range Status   Specimen Description   Final    BLOOD RIGHT ANTECUBITAL Performed at [facility], [address] [place], [place] [address]    Special Requests   Final    BOTTLES DRAWN AEROBIC AND ANAEROBIC Blood Culture adequate volume Performed at [facility], [address] [place], [place] [address]    Culture  Setup Time    Final    GRAM POSITIVE COCCI IN CHAINS IN PAIRS IN BOTH AEROBIC AND ANAEROBIC BOTTLES CRITICAL RESULT CALLED TO, READ BACK BY AND VERIFIED WITH: J,MILLEN PHARMD @1305  12/29/20 EB Performed at [facility], [address], [place], [place]    Culture STREPTOCOCCUS DYSGALACTIAE (A)  Final   Report Status 12/31/2020 FINAL  Final   Organism ID, Bacteria STREPTOCOCCUS DYSGALACTIAE  Final      Susceptibility   Streptococcus dysgalactiae - MIC*    PENICILLIN <=0.06 SENSITIVE Sensitive     CEFTRIAXONE <=0.12 SENSITIVE Sensitive     ERYTHROMYCIN <=0.12 SENSITIVE Sensitive     LEVOFLOXACIN 0.5 SENSITIVE Sensitive     VANCOMYCIN 0.5 SENSITIVE Sensitive     * STREPTOCOCCUS DYSGALACTIAE  Blood Culture ID Panel (Reflexed)     Status: Abnormal   Collection Time: 12/28/20  6:00 PM  Result Value Ref Range Status   Enterococcus faecalis NOT DETECTED NOT DETECTED Final   Enterococcus Faecium NOT DETECTED NOT DETECTED Final   Listeria monocytogenes NOT DETECTED NOT DETECTED Final   Staphylococcus species NOT DETECTED NOT DETECTED Final   Staphylococcus aureus (BCID) NOT DETECTED NOT DETECTED Final   Staphylococcus epidermidis NOT DETECTED NOT DETECTED Final   Staphylococcus lugdunensis NOT DETECTED NOT DETECTED Final   Streptococcus species DETECTED (A) NOT DETECTED Final    Comment: Not Enterococcus species, Streptococcus agalactiae, Streptococcus pyogenes, or Streptococcus pneumoniae. CRITICAL RESULT CALLED TO, READ BACK BY AND VERIFIED WITH: J,MILLEN PHARMD @  1305 12/29/20 EB    Streptococcus agalactiae NOT DETECTED NOT DETECTED Final   Streptococcus pneumoniae NOT DETECTED NOT DETECTED Final   Streptococcus pyogenes NOT DETECTED NOT DETECTED Final   A.calcoaceticus-baumannii NOT DETECTED NOT DETECTED Final   Bacteroides fragilis NOT DETECTED NOT DETECTED Final   Enterobacterales NOT DETECTED NOT DETECTED Final   Enterobacter cloacae complex NOT DETECTED NOT DETECTED Final    Escherichia coli NOT DETECTED NOT DETECTED Final   Klebsiella aerogenes NOT DETECTED NOT DETECTED Final   Klebsiella oxytoca NOT DETECTED NOT DETECTED Final   Klebsiella pneumoniae NOT DETECTED NOT DETECTED Final   Proteus species NOT DETECTED NOT DETECTED Final   Salmonella species NOT DETECTED NOT DETECTED Final   Serratia marcescens NOT DETECTED NOT DETECTED Final   Haemophilus influenzae NOT DETECTED NOT DETECTED Final   Neisseria meningitidis NOT DETECTED NOT DETECTED Final   Pseudomonas aeruginosa NOT DETECTED NOT DETECTED Final   Stenotrophomonas maltophilia NOT DETECTED NOT DETECTED Final   Candida albicans NOT DETECTED NOT DETECTED Final   Candida auris NOT DETECTED NOT DETECTED Final   Candida glabrata NOT DETECTED NOT DETECTED Final   Candida krusei NOT DETECTED NOT DETECTED Final   Candida parapsilosis NOT DETECTED NOT DETECTED Final   Candida tropicalis NOT DETECTED NOT DETECTED Final   Cryptococcus neoformans/gattii NOT DETECTED NOT DETECTED Final    Comment: Performed at [facility], [address] [place], [place] [address]  Resp Panel by RT-PCR (Flu A&B, Covid) Nasopharyngeal Swab     Status: None   Collection Time: 12/28/20  6:08 PM   Specimen: Nasopharyngeal Swab; Nasopharyngeal(NP) swabs in vial transport medium  Result Value Ref Range Status   SARS Coronavirus 2 by RT PCR NEGATIVE NEGATIVE Final    Comment: (NOTE) SARS-CoV-2 target nucleic acids are NOT DETECTED.  The SARS-CoV-2 RNA is generally detectable in upper respiratory specimens during the acute phase of infection. The lowest concentration of SARS-CoV-2 viral copies this assay can detect is 138 copies/mL. A negative result does not preclude SARS-Cov-2 infection and should not be used as the sole basis for treatment or other patient management decisions. A negative result may occur with  improper specimen collection/handling, submission of specimen other than nasopharyngeal swab, presence of viral  mutation(s) within the areas targeted by this assay, and inadequate number of viral copies(<138 copies/mL). A negative result must be combined with clinical observations, patient history, and epidemiological information. The expected result is Negative.  Fact Sheet for Patients:  BloggerCourse.com  Fact Sheet for Healthcare Providers:  SeriousBroker.it  This test is no t yet approved or cleared by the [place] FDA and  has been authorized for detection and/or diagnosis of SARS-CoV-2 by FDA under an Emergency Use Authorization (EUA). This EUA will remain  in effect (meaning this test can be used) for the duration of the COVID-19 declaration under Section 564(b)(1) of the Act, 21 U.S.C.section 360bbb-3(b)(1), unless the authorization is terminated  or revoked sooner.       Influenza A by PCR NEGATIVE NEGATIVE Final   Influenza B by PCR NEGATIVE NEGATIVE Final    Comment: (NOTE) The Xpert Xpress SARS-CoV-2/FLU/RSV plus assay is intended as an aid in the diagnosis of influenza from Nasopharyngeal swab specimens and should not be used as a sole basis for treatment. Nasal washings and aspirates are unacceptable for Xpert Xpress SARS-CoV-2/FLU/RSV testing.  Fact Sheet for Patients: BloggerCourse.com  Fact Sheet for Healthcare Providers: SeriousBroker.it  This test is not yet approved or cleared by the  [place] [employer] and has been authorized for detection and/or diagnosis of SARS-CoV-2 by FDA under an TEFL teacher (EUA). This EUA will remain in effect (meaning this test can be used) for the duration of the COVID-19 declaration under Section 564(b)(1) of the Act, 21 U.S.C. section 360bbb-3(b)(1), unless the authorization is terminated or revoked.  Performed at [facility], [address] [place], [place] [address]   Culture, blood (routine  x 2)     Status: Abnormal   Collection Time: 12/28/20 10:30 PM   Specimen: BLOOD  Result Value Ref Range Status   Specimen Description   Final    BLOOD RIGHT ANTECUBITAL Performed at [facility], [address], [place], [place] [address]    Special Requests   Final    BOTTLES DRAWN AEROBIC AND ANAEROBIC Blood Culture adequate volume Performed at [facility], [address] [place], [place] [address]    Culture  Setup Time   Final    GRAM POSITIVE COCCI IN BOTH AEROBIC AND ANAEROBIC BOTTLES CRITICAL VALUE NOTED.  VALUE IS CONSISTENT WITH PREVIOUSLY REPORTED AND CALLED VALUE.    Culture (A)  Final    STREPTOCOCCUS DYSGALACTIAE SUSCEPTIBILITIES PERFORMED ON PREVIOUS CULTURE WITHIN THE LAST 5 DAYS. Performed at [facility], [address] [place], [place] [address]    Report Status 12/31/2020 FINAL  Final  MRSA Next Gen by PCR, Nasal     Status: None   Collection Time: 12/29/20 12:20 AM   Specimen: Nasal Mucosa; Nasal Swab  Result Value Ref Range Status   MRSA by PCR Next Gen NOT DETECTED NOT DETECTED Final    Comment: (NOTE) The GeneXpert MRSA Assay (FDA approved for NASAL specimens only), is one component of a comprehensive MRSA colonization surveillance program. It is not intended to diagnose MRSA infection nor to guide or monitor treatment for MRSA infections. Test performance is not FDA approved in patients less than 63 years old. Performed at [facility], [address] [place], [place] [address]   Culture, blood (routine x 2)     Status: None (Preliminary result)   Collection Time: 12/30/20 11:21 AM   Specimen: BLOOD RIGHT HAND  Result Value Ref Range Status   Specimen Description BLOOD RIGHT HAND  Final   Special Requests   Final    BOTTLES DRAWN AEROBIC AND ANAEROBIC Blood Culture adequate volume   Culture   Final    NO GROWTH 4 DAYS Performed at [facility], [address] [place], [place]  [address]    Report Status PENDING  Incomplete  Culture, blood (routine x 2)     Status: None (Preliminary result)   Collection Time: 12/30/20 11:21 AM   Specimen: BLOOD LEFT HAND  Result Value Ref Range Status   Specimen Description BLOOD LEFT HAND  Final   Special Requests   Final    AEROBIC BOTTLE ONLY Blood Culture results may not be optimal due to an inadequate volume of blood received in culture bottles   Culture   Final    NO GROWTH 4 DAYS Performed at [facility], [address] [place], [place] [address]    Report Status PENDING  Incomplete  Resp Panel by RT-PCR (Flu A&B, Covid) Nasopharyngeal Swab     Status: None   Collection Time: 01/02/21  6:28 PM   Specimen: Nasopharyngeal Swab; Nasopharyngeal(NP) swabs in vial transport medium  Result Value Ref Range Status  SARS Coronavirus 2 by RT PCR NEGATIVE NEGATIVE Final    Comment: (NOTE) SARS-CoV-2 target nucleic acids are NOT DETECTED.  The SARS-CoV-2 RNA is generally detectable in upper respiratory specimens during the acute phase of infection. The lowest concentration of SARS-CoV-2 viral copies this assay can detect is 138 copies/mL. A negative result does not preclude SARS-Cov-2 infection and should not be used as the sole basis for treatment or other patient management decisions. A negative result may occur with  improper specimen collection/handling, submission of specimen other than nasopharyngeal swab, presence of viral mutation(s) within the areas targeted by this assay, and inadequate number of viral copies(<138 copies/mL). A negative result must be combined with clinical observations, patient history, and epidemiological information. The expected result is Negative.  Fact Sheet for Patients:  BloggerCourse.com  Fact Sheet for Healthcare Providers:  SeriousBroker.it  This test is no t yet approved or cleared by the [place] FDA and  has been  authorized for detection and/or diagnosis of SARS-CoV-2 by FDA under an Emergency Use Authorization (EUA). This EUA will remain  in effect (meaning this test can be used) for the duration of the COVID-19 declaration under Section 564(b)(1) of the Act, 21 U.S.C.section 360bbb-3(b)(1), unless the authorization is terminated  or revoked sooner.       Influenza A by PCR NEGATIVE NEGATIVE Final   Influenza B by PCR NEGATIVE NEGATIVE Final    Comment: (NOTE) The Xpert Xpress SARS-CoV-2/FLU/RSV plus assay is intended as an aid in the diagnosis of influenza from Nasopharyngeal swab specimens and should not be used as a sole basis for treatment. Nasal washings and aspirates are unacceptable for Xpert Xpress SARS-CoV-2/FLU/RSV testing.  Fact Sheet for Patients: BloggerCourse.com  Fact Sheet for Healthcare Providers: SeriousBroker.it  This test is not yet approved or cleared by the [place] FDA and has been authorized for detection and/or diagnosis of SARS-CoV-2 by FDA under an Emergency Use Authorization (EUA). This EUA will remain in effect (meaning this test can be used) for the duration of the COVID-19 declaration under Section 564(b)(1) of the Act, 21 U.S.C. section 360bbb-3(b)(1), unless the authorization is terminated or revoked.  Performed at [facility], [address], [place], [place] [address]      Time coordinating discharge:  35 minutes  SIGNED:   Dorcas Carrow, MD  [facility] 01/03/2021, 10:04 AM

## 2021-01-03 LAB — GLUCOSE, CAPILLARY
Glucose-Capillary: 234 mg/dL — ABNORMAL HIGH (ref 70–99)
Glucose-Capillary: 252 mg/dL — ABNORMAL HIGH (ref 70–99)
Glucose-Capillary: 273 mg/dL — ABNORMAL HIGH (ref 70–99)
Glucose-Capillary: 175 mg/dL — ABNORMAL HIGH (ref 70–99)
Glucose-Capillary: 205 mg/dL — ABNORMAL HIGH (ref 70–99)

## 2021-01-03 NOTE — TOC Transition Note (Signed)
Transition of Care [facility]) - CM/SW Discharge Note   Patient Details  Name: Tonya Baker MRN: [id number] Date of Birth: 02/18/1951  Transition of Care [facility]) CM/SW Contact:  Ralene Bathe, LCSWA Phone Number: 01/03/2021, 10:11 AM   Clinical Narrative:    Patient will DC to: [facility]  Anticipated DC date:01/03/2021 Family notified: Yes Transport by: Sharin Mons   Per MD patient ready for DC to SNF. RN to call report prior to discharge [phone]. RN, patient, patient's family, and facility notified of DC. Discharge Summary and FL2 sent to facility. DC packet on chart. Ambulance transport will be requested for patient when RN reports that the patient is ready.   CSW will sign off for now as social work intervention is no longer needed. Please consult [place] again if [place] needs arise.     Final next level of care: Skilled Nursing Facility Barriers to Discharge: No Barriers Identified   Patient Goals and CMS Choice        Discharge Placement              Patient chooses bed at: [facility] Patient to be transferred to facility by: [facility] Name of family member notified: Massey,[name] (Niece)   [phone] Patient and family notified of of transfer: 01/03/21  Discharge Plan and Services                                     Social Determinants of Health (SDOH) Interventions     Readmission Risk Interventions No flowsheet data found.

## 2021-01-03 NOTE — Progress Notes (Signed)
Attempted to call report, no answer once transferred to the unit.  Will try again prior to discharge.

## 2021-01-03 NOTE — TOC Progression Note (Signed)
Transition of Care [facility]) - Initial/Assessment Note    Patient Details  Name: Tonya Baker MRN: [id number] Date of Birth: Jun 30, 1950  Transition [facility] Care [facility]) CM/SW Contact:    Ralene Bathe, LCSWA Phone Number: 01/03/2021, 9:51 AM  Clinical Narrative:                 CSW spoke with Olegario Messier with admissions at [facility].  The facility can accept the patient after 1200.   Pending:  Discharge summary  Expected Discharge Plan: Skilled Nursing Facility     Patient Goals and CMS Choice        Expected Discharge Plan and Services Expected Discharge Plan: Skilled Nursing Facility       Living arrangements for the past 2 months: Skilled Nursing Facility [facility]) Expected Discharge Date: 01/02/21                                    Prior Living Arrangements/Services Living arrangements for the past 2 months: Skilled Nursing Facility [facility])                     Activities of Daily Living      Permission Sought/Granted   Permission granted to share information with : Yes, Verbal Permission Granted              Emotional Assessment              Admission diagnosis:  Acute osteomyelitis of lumbar spine (HCC) [M46.26] Hypotension [I95.9] Septic shock (HCC) [A41.9, R65.21] Sepsis (HCC) [A41.9] Patient Active Problem List   Diagnosis Date Noted   Bacteremia due to Streptococcus    Volume overload 12/29/2020   Hardware complicating wound infection (HCC)    Vertebral osteomyelitis (HCC)    Back pain 12/28/2020   Hypotension 12/28/2020   CKD (chronic kidney disease) stage 3, GFR 30-59 ml/min (HCC) 12/28/2020   Spinal stenosis 12/28/2020   Obstructive sleep apnea 12/28/2020   Sepsis (HCC) 12/28/2020   Type 2 diabetes mellitus with diabetic neuropathy, with long-term current use of insulin (HCC) 12/28/2020   PCP:  Eloisa Northern, MD Pharmacy:  No Pharmacies Listed    Social Determinants of Health (SDOH) Interventions    Readmission Risk  Interventions No flowsheet data found.

## 2021-01-04 LAB — CULTURE, BLOOD (ROUTINE X 2)
Culture: NO GROWTH
Culture: NO GROWTH
Special Requests: ADEQUATE

## 2021-01-07 LAB — CBC
HCT: 33.6 % — ABNORMAL LOW (ref 36.0–46.0)
Hemoglobin: 10.8 g/dL — ABNORMAL LOW (ref 12.0–15.0)
MCH: 26.5 pg (ref 26.0–34.0)
MCHC: 32.1 g/dL (ref 30.0–36.0)
MCV: 82.6 fL (ref 80.0–100.0)
Platelets: 222 10*3/uL (ref 150–400)
RBC: 4.07 MIL/uL (ref 3.87–5.11)
RDW: 15.2 % (ref 11.5–15.5)
WBC: 6.5 10*3/uL (ref 4.0–10.5)
nRBC: 0 % (ref 0.0–0.2)

## 2021-01-07 LAB — BASIC METABOLIC PANEL
Anion gap: 11 (ref 5–15)
BUN: 26 mg/dL — ABNORMAL HIGH (ref 8–23)
CO2: 31 mmol/L (ref 22–32)
Calcium: 9.1 mg/dL (ref 8.9–10.3)
Chloride: 94 mmol/L — ABNORMAL LOW (ref 98–111)
Creatinine, Ser: 1.25 mg/dL — ABNORMAL HIGH (ref 0.44–1.00)
GFR, Estimated: 47 mL/min — ABNORMAL LOW (ref >60)
Glucose, Bld: 90 mg/dL (ref 70–99)
Potassium: 2.9 mmol/L — ABNORMAL LOW (ref 3.5–5.1)
Sodium: 136 mmol/L (ref 135–145)

## 2021-01-23 ENCOUNTER — Emergency Department (HOSPITAL_COMMUNITY): Payer: Medicare Other

## 2021-01-23 ENCOUNTER — Emergency Department (HOSPITAL_COMMUNITY)
Admission: EM | Admit: 2021-01-23 | Discharge: 2021-01-24 | Disposition: A | Discharge status: Home / Self Care | Payer: Medicare Other | Attending: Emergency Medicine | Admitting: Emergency Medicine

## 2021-01-23 DIAGNOSIS — W050XXA Fall from non-moving wheelchair, initial encounter: Secondary | ICD-10-CM | POA: Insufficient documentation

## 2021-01-23 DIAGNOSIS — N183 Chronic kidney disease, stage 3 unspecified: Secondary | ICD-10-CM | POA: Insufficient documentation

## 2021-01-23 DIAGNOSIS — Z7982 Long term (current) use of aspirin: Secondary | ICD-10-CM | POA: Insufficient documentation

## 2021-01-23 DIAGNOSIS — M25551 Pain in right hip: Secondary | ICD-10-CM | POA: Diagnosis not present

## 2021-01-23 DIAGNOSIS — W19XXXA Unspecified fall, initial encounter: Secondary | ICD-10-CM

## 2021-01-23 DIAGNOSIS — Z794 Long term (current) use of insulin: Secondary | ICD-10-CM | POA: Diagnosis not present

## 2021-01-23 DIAGNOSIS — M545 Low back pain, unspecified: Secondary | ICD-10-CM | POA: Diagnosis not present

## 2021-01-23 DIAGNOSIS — Z91040 Latex allergy status: Secondary | ICD-10-CM | POA: Diagnosis not present

## 2021-01-23 DIAGNOSIS — Z23 Encounter for immunization: Secondary | ICD-10-CM | POA: Insufficient documentation

## 2021-01-23 DIAGNOSIS — S0101XA Laceration without foreign body of scalp, initial encounter: Secondary | ICD-10-CM | POA: Diagnosis not present

## 2021-01-23 DIAGNOSIS — S0990XA Unspecified injury of head, initial encounter: Secondary | ICD-10-CM | POA: Diagnosis present

## 2021-01-23 DIAGNOSIS — E1122 Type 2 diabetes mellitus with diabetic chronic kidney disease: Secondary | ICD-10-CM | POA: Diagnosis not present

## 2021-01-23 DIAGNOSIS — E1140 Type 2 diabetes mellitus with diabetic neuropathy, unspecified: Secondary | ICD-10-CM | POA: Diagnosis not present

## 2021-01-23 MED ORDER — LIDOCAINE-EPINEPHRINE-TETRACAINE (LET) TOPICAL GEL
3.0000 mL | Freq: Once | TOPICAL | Status: AC
  Administered 2021-01-23: 3 mL via TOPICAL
  Filled 2021-01-23: qty 3

## 2021-01-23 MED ORDER — TETANUS-DIPHTH-ACELL PERTUSSIS 5-2.5-18.5 LF-MCG/0.5 IM SUSY
0.5000 mL | PREFILLED_SYRINGE | Freq: Once | INTRAMUSCULAR | Status: AC
  Administered 2021-01-23: 0.5 mL via INTRAMUSCULAR
  Filled 2021-01-23: qty 0.5

## 2021-01-23 MED ORDER — ACETAMINOPHEN 500 MG PO TABS
1000.0000 mg | ORAL_TABLET | Freq: Once | ORAL | Status: AC
  Administered 2021-01-23: 1000 mg via ORAL
  Filled 2021-01-23: qty 2

## 2021-01-23 NOTE — ED Triage Notes (Signed)
Ems brings pt in from [facility]. Pt tried to get out of wheelchair and fell. States she hit her head on the bed side table. No blood thinners. Pt at baseline per staff.  Pt is alert and oriented.

## 2021-01-23 NOTE — Discharge Instructions (Addendum)
Staples will need to be removed in 7 to 10 days.  Return to emergency room if you have any worsening symptoms.

## 2021-01-23 NOTE — ED Provider Notes (Signed)
LACERATION REPAIR Performed by: Langston Masker Authorized by: Langston Masker Consent: Verbal consent obtained. Risks and benefits: risks, benefits and alternatives were discussed Consent given by: patient Patient identity confirmed: provided demographic data Prepped and Draped in normal sterile fashion Wound explored  Laceration Location: right scalp  Laceration Length: 1.5cm  No Foreign Bodies seen or palpated  Anesthesia: local infiltration  Local anesthetic: lidocaine topical  Anesthetic total: 2 ml  Irrigation method: syringe Amount of cleaning: standard  Skin closure: staples  Number of sutures: 3  Technique: staples  Patient tolerance: Patient tolerated the procedure well with no immediate complications.    Osie Cheeks 01/23/21 2034    Rolan Bucco, MD 01/23/21 2204

## 2021-01-23 NOTE — ED Provider Notes (Signed)
[facility]-EMERGENCY DEPT Provider Note   CSN: [id number] Arrival date & time: 01/23/21  1628     History Chief Complaint  Patient presents with   Tonya Baker is a 70 y.o. female.  Patient is a 70 year old female who presents after a fall.  She lives at [facility].  She was trying to transfer from the bed to the wheelchair and the wheelchair kept rolling and she fell backward.  She hit her head on the bedside table.  There is no loss of consciousness.  She is not on anticoagulants other than a baby aspirin.  She also fell onto her right side and has some mild pain to her right hip.  She has some chronic low back pain but says it hurts worse since she fell.  She has neuropathy in her feet but denies any change in sensation to her lower extremities.  No weakness to her lower extremities.  She denies other injuries.  She does not know when her last tetanus shot was.      Past Medical History:  Diagnosis Date   Back pain    Obstructive sleep apnea    Type 2 diabetes mellitus with diabetic autonomic neuropathy, with long-term current use of insulin [facility])     Patient Active Problem List   Diagnosis Date Noted   Bacteremia due to Streptococcus    Volume overload 12/29/2020   Hardware complicating wound infection (HCC)    Vertebral osteomyelitis (HCC)    Back pain 12/28/2020   Hypotension 12/28/2020   CKD (chronic kidney disease) stage 3, GFR 30-59 ml/min (HCC) 12/28/2020   Spinal stenosis 12/28/2020   Obstructive sleep apnea 12/28/2020   Sepsis (HCC) 12/28/2020   Type 2 diabetes mellitus with diabetic neuropathy, with long-term current use of insulin (HCC) 12/28/2020    Past Surgical History:  Procedure Laterality Date   BACK SURGERY N/A 06/2020     OB History   No obstetric history on file.     No family history on file.     Home Medications Prior to Admission medications   Medication Sig Start Date End Date Taking?  Authorizing Provider  acetaminophen (TYLENOL) 325 MG tablet Take 650 mg by mouth every 6 (six) hours as needed (pain).    [provider]  albuterol (PROVENTIL) (2.5 MG/3ML) 0.083% nebulizer solution Take 2.5 mg by nebulization every 8 (eight) hours as needed (congestion).    [provider]  aspirin 81 MG chewable tablet Chew 81 mg by mouth every morning.    [provider]  atorvastatin (LIPITOR) 40 MG tablet Take 40 mg by mouth at bedtime.    [provider]  bisacodyl (DULCOLAX) 10 MG suppository Place 10 mg rectally daily as needed (constipation).    [provider]  cholecalciferol (VITAMIN D3) 25 MCG (1000 UNIT) tablet Take 1,000 Units by mouth every morning.    [provider]  cyclobenzaprine (FLEXERIL) 10 MG tablet Take 10 mg by mouth 3 (three) times daily.    [provider]  docusate sodium (COLACE) 100 MG capsule Take 100 mg by mouth every morning.    [provider]  doxycycline (VIBRAMYCIN) 100 MG capsule Take 100 mg by mouth every 12 (twelve) hours. Stat date: 10/03/20    [provider]  Dulaglutide (TRULICITY) 0.75 MG/0.5ML SOPN Inject 0.75 mg into the skin every Monday.    [provider]  ferrous sulfate 325 (65 FE) MG tablet Take 325 mg  by mouth every morning.    [provider]  furosemide (LASIX) 40 MG tablet Take 40 mg by mouth every morning.    [provider]  gabapentin (NEURONTIN) 300 MG capsule Take 600 mg by mouth 3 (three) times daily.    [provider]  insulin aspart (NOVOLOG FLEXPEN) 100 UNIT/ML FlexPen Inject 0-12 Units into the skin See admin instructions. Inject 0-12 units subcutaneously three times daily per sliding scale:  CBG 70-150 0 units, 151-200 2 units, 201-250 4 units, 251-300 6 units, 301-350 8 units, 351-400 10 units, >400 12 units and call MD    [provider]  insulin detemir (LEVEMIR) 100 UNIT/ML injection Inject 60 Units  into the skin every morning.    [provider]  magnesium citrate SOLN Take 30 mLs by mouth every 12 (twelve) hours as needed (constipation).    [provider]  metoCLOPramide (REGLAN) 5 MG tablet Take 5 mg by mouth 4 (four) times daily -  before meals and at bedtime.    [provider]  midodrine (PROAMATINE) 10 MG tablet Take 3 tablets (30 mg total) by mouth 3 (three) times daily. 01/02/21   Dorcas Carrow, MD  mineral oil (FLEET OIL) enema Place 1 enema rectally daily as needed (impaction).    [provider]  Multiple Vitamin (MULTIVITAMIN WITH MINERALS) TABS tablet Take 1 tablet by mouth every morning. Patient not taking: Reported on 12/29/2020    [provider]  pantoprazole (PROTONIX) 40 MG tablet Take 40 mg by mouth 2 (two) times daily.    [provider]  polyethylene glycol (MIRALAX / GLYCOLAX) 17 g packet Take 17 g by mouth every morning.    [provider]  potassium chloride SA (KLOR-CON) 20 MEQ tablet Take 20 mEq by mouth every morning.    [provider]  QUEtiapine (SEROQUEL) 25 MG tablet Take 25 mg by mouth at bedtime.    [provider]  sertraline (ZOLOFT) 100 MG tablet Take 100 mg by mouth every morning.    [provider]  Skin Protectants, Misc. (EUCERIN) cream Apply 1 application topically daily.    [provider]    Allergies    Cortisone, Hydrocortisone, and Latex  Review of Systems   Review of Systems  Constitutional:  Negative for activity change, appetite change and fever.  HENT:  Negative for dental problem, nosebleeds and trouble swallowing.        Scalp laceration  Eyes:  Negative for pain and visual disturbance.  Respiratory:  Negative for shortness of breath.   Cardiovascular:  Negative for chest pain.  Gastrointestinal:  Negative for abdominal pain, nausea and vomiting.  Genitourinary:  Negative for dysuria and hematuria.  Musculoskeletal:  Positive for  arthralgias and back pain. Negative for joint swelling and neck pain.  Skin:  Negative for wound.  Neurological:  Negative for weakness, numbness and headaches.  Psychiatric/Behavioral:  Negative for confusion.    Physical Exam Updated Vital Signs BP 100/67   Pulse 77   Temp 98 F (36.7 C) (Oral)   Resp 20   Ht 5\' 4"  (1.626 m)   Wt 74.8 kg   SpO2 98%   BMI 28.32 kg/m   Physical Exam Constitutional:      Appearance: She is well-developed.  HENT:     Head: Normocephalic.     Comments: 1 cm laceration to the posterior scalp, no active bleeding Eyes:     Pupils: Pupils are equal, round, and reactive to  light.  Neck:     Comments: No pain along the cervical, thoracic spine.  +tenderness throughout LS spine.  No step-offs or deformity Cardiovascular:     Rate and Rhythm: Normal rate and regular rhythm.     Heart sounds: Normal heart sounds.  Pulmonary:     Effort: Pulmonary effort is normal. No respiratory distress.     Breath sounds: Normal breath sounds. No wheezing or rales.  Chest:     Chest wall: No tenderness.  Abdominal:     General: Bowel sounds are normal.     Palpations: Abdomen is soft.     Tenderness: There is no abdominal tenderness. There is no guarding or rebound.  Musculoskeletal:        General: Normal range of motion.     Comments: Mild tenderness on range of motion of the right hip, no deformity or rotation.  Lymphadenopathy:     Cervical: No cervical adenopathy.  Skin:    General: Skin is warm and dry.     Findings: No rash.  Neurological:     Mental Status: She is alert and oriented to person, place, and time.     Comments: Normal sensation and motor function to all extremities    ED Results / Procedures / Treatments   Labs (all labs ordered are listed, but only abnormal results are displayed) Labs Reviewed - No data to display  EKG None  Radiology CT Head Wo Contrast  Result Date: 01/23/2021 CLINICAL DATA:  Larey Seat out of wheelchair, hit  head EXAM: CT HEAD WITHOUT CONTRAST TECHNIQUE: Contiguous axial images were obtained from the base of the skull through the vertex without intravenous contrast. COMPARISON:  10/19/2020 FINDINGS: Brain: No acute infarct or hemorrhage. 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 Lateral ventricles and midline structures are otherwise unremarkable. No acute extra-axial fluid collections. No mass effect. Vascular: No hyperdense vessel or unexpected calcification. Skull: Normal. Negative for fracture or focal lesion. Sinuses/Orbits: No acute finding. Other: None. IMPRESSION: 1. Stable head CT, no acute intracranial process. Electronically Signed   By: Sharlet Salina M.D.   On: 01/23/2021 18:38   CT Cervical Spine Wo Contrast  Result Date: 01/23/2021 CLINICAL DATA:  Larey Seat out of wheelchair, hit head EXAM: CT CERVICAL SPINE WITHOUT CONTRAST TECHNIQUE: Multidetector CT imaging of the cervical spine was performed without intravenous contrast. Multiplanar CT image reconstructions were also generated. COMPARISON:  None. FINDINGS: Alignment: There is mild anterolisthesis of C4 relative to C5, likely due to facet hypertrophic change. Otherwise alignment is grossly anatomic. Skull base and vertebrae: No acute fracture. No primary bone lesion or focal pathologic process. Soft tissues and spinal canal: No prevertebral fluid or swelling. No visible canal hematoma. Disc levels: There is extensive multilevel cervical spondylosis greatest at C3-4, C5-6, and C6-7. Prominent facet hypertrophic changes are greatest from C2 through C5. Upper chest: Central airway is patent.  Lung apices are clear. Other: Reconstructed images demonstrate no additional findings. IMPRESSION: 1. No acute cervical spine fracture. 2. Multilevel cervical spondylosis and facet hypertrophy. Electronically Signed   By: Sharlet Salina M.D.   On: 01/23/2021 18:41   CT Lumbar Spine Wo Contrast  Result Date: 01/23/2021 CLINICAL DATA:  Low back pain,  trauma EXAM: CT LUMBAR SPINE WITHOUT CONTRAST TECHNIQUE: Multidetector CT imaging of the lumbar spine was performed without intravenous contrast administration. Multiplanar CT image reconstructions were also generated. COMPARISON:  12/28/2020 FINDINGS: Segmentation: 5 lumbar type vertebrae. Alignment: Stable from recent prior study. Vertebrae: Vertebral body heights are  stable from recent prior study. No acute fracture identified within limitation of decreased osseous mineralization. Postoperative changes are again identified with posterior fusion construct and laminectomies spanning L1-S1. As noted previously, there is lucency about the sacral screws suggesting loosening. There is anterior fusion with plate and screw fixation at L4-L5. Interbody spacers are present at this level as well as L5-S1. There is no significant bridging bone. Subsidence is present at L5-S1 with lucency about the spacer. Paraspinal and other soft tissues: Unremarkable apart from chronic postoperative changes. Disc levels: No Boesel stenosis appreciated over the short interval. IMPRESSION: Stable appearance.  No acute abnormality. Electronically Signed   By: Guadlupe Spanish M.D.   On: 01/23/2021 18:38   DG Hip Unilat W or Wo Pelvis 2-3 Views Right  Result Date: 01/23/2021 CLINICAL DATA:  Right hip pain after fall. EXAM: DG HIP (WITH OR WITHOUT PELVIS) 2-3V RIGHT COMPARISON:  None. FINDINGS: There is no evidence of hip fracture or dislocation. Mild narrowing and osteophyte formation is seen involving the right hip. 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 No acute abnormality seen. Electronically Signed   By: Lupita Raider M.D.   On: 01/23/2021 18:39    Procedures Procedures   Medications Ordered in ED Medications  Tdap (BOOSTRIX) injection 0.5 mL (0.5 mLs Intramuscular Given 01/23/21 1750)  acetaminophen (TYLENOL) tablet 1,000 mg (1,000 mg Oral Given 01/23/21 1938)  lidocaine-EPINEPHrine-tetracaine (LET)  topical gel (3 mLs Topical Given 01/23/21 1949)    ED Course  I have reviewed the triage vital signs and the nursing notes.  Pertinent labs & imaging results that were available during my care of the patient were reviewed by me and considered in my medical decision making (see chart for details).    MDM Rules/Calculators/A&P                           Patient is a 70 year old female who presents after mechanical fall.  She has a small laceration to her posterior scalp.  This was sutured in the ED.  Her tetanus shot was updated.  She had imaging of her head, cervical spine and lumbosacral spine.  There is no acute abnormalities.  She is neurologically intact.  She had x-rays of her right hip which showed no evidence of fracture.  I have a low suspicion of occult fracture.  She has full range of motion with minimal pain.  She was discharged home in good condition.  She was given wound care instructions and head injury precautions.  Was advised that staples will need to be removed in 7 to 10 days. Final Clinical Impression(s) / ED Diagnoses Final diagnoses:  Fall, initial encounter  Injury of head, initial encounter  Laceration of scalp, initial encounter    Rx / DC Orders ED Discharge Orders     None        Rolan Bucco, MD 01/23/21 2110

## 2021-02-07 ENCOUNTER — Other Ambulatory Visit (HOSPITAL_COMMUNITY): Payer: Self-pay | Admitting: Neurological Surgery

## 2021-02-07 DIAGNOSIS — S32009K Unspecified fracture of unspecified lumbar vertebra, subsequent encounter for fracture with nonunion: Secondary | ICD-10-CM

## 2021-02-10 ENCOUNTER — Other Ambulatory Visit: Payer: Self-pay | Admitting: Neurological Surgery

## 2021-03-06 NOTE — Pre-Procedure Instructions (Addendum)
    Tonya Baker  03/06/2021     Your procedure is scheduled on Monday, December 5th.  Report to [facility] Admitting at 5:30 A.M.   Call this number if you have problems the morning of surgery:[phone]- this is the Pre- Op Desk skilled nursing facility  >>>>>Please send patient's Medication Record with medications administrated documentation. ( this information is required prior to OR. This includes medications that may have been on hold for surgery)<<<<<     Remember:  Do not eat after midnight Sunday, December 4.  You may drink clear water until 4:30 AM .     Take these medicines the morning of surgery with A SIP OF WATER: cyclobenzaprine (FLEXERIL) gabapentin (NEURONTIN)  metoCLOPramide (REGLAN) midodrine (PROAMATINE) morphine (MS CONTIN)  pantoprazole (PROTONIX)  sertraline (ZOLOFT)   Check  blood sugar the morning of your surgery when you wake up and every 2 hours until you get to the Short Stay unit.  Diabetic medication: If CBG is greater than give Ms Caras 30 Units of Lantus. If CBG is greater than 220 - give 1/2/ of sliding scale dose.  If your blood sugar is less than 70 mg/dL, you will need to treat for low blood sugar: Use the Center's protocol on how to treat hypoglycemia when a patient is NPO. Recheck blood sugar in 15 minutes after treatment (to make sure it is greater than 70 mg/dL). If your blood sugar is not greater than 70 mg/dL on recheck, call [phone]  for further instructions. Report your blood sugar to the short stay nurse when you get to Short Stay.  If you are admitted to the hospital after surgery: Your blood sugar will be checked by the staff and you will probably be given insulin after surgery (instead of oral diabetes medicines) to make sure you have good blood sugar levels. The goal for blood sugar control after surgery is 80-180 mg/dL.  Ms Lococo should have a shower, with antibacteria soap, the morning of surgery . Dry off with  a clean towel.  Patient should not have lotions, powders, colognes, deodorant, make- up or nail polish :jewelry, or piercing's. Wear clean comfortable clothes.  Brush teeth.    Do not shave 48 hours prior to surgery.    Do not bring valuables to the hospital.  [facility] is not responsible for any belongings or valuables.  Contacts, dentures or bridgework may not be worn into surgery. If you have a case for glasses , please bring it in with you.  For patients admitted to the hospital, discharge time will be determined by your treatment team.  Patients discharged the day of surgery will not be allowed to drive home.    Cathy the unit supervisor received this fax and had no questions.

## 2021-03-07 ENCOUNTER — Ambulatory Visit (HOSPITAL_COMMUNITY)
Admission: RE | Admit: 2021-03-07 | Discharge: 2021-03-07 | Disposition: A | Discharge status: Home / Self Care | Payer: Medicare Other | Source: Ambulatory Visit | Attending: Neurological Surgery | Admitting: Neurological Surgery

## 2021-03-07 ENCOUNTER — Other Ambulatory Visit: Payer: Self-pay

## 2021-03-07 DIAGNOSIS — S32009K Unspecified fracture of unspecified lumbar vertebra, subsequent encounter for fracture with nonunion: Secondary | ICD-10-CM

## 2021-03-10 ENCOUNTER — Encounter (HOSPITAL_COMMUNITY): Payer: Self-pay | Admitting: Neurological Surgery

## 2021-03-10 ENCOUNTER — Inpatient Hospital Stay (HOSPITAL_COMMUNITY): Payer: Medicare Other

## 2021-03-10 ENCOUNTER — Inpatient Hospital Stay (HOSPITAL_COMMUNITY)
Admission: RE | Admit: 2021-03-10 | Discharge: 2021-03-14 | DRG: 460 | Disposition: A | Discharge status: Skilled Nursing Facility | Payer: Medicare Other | Attending: Neurological Surgery | Admitting: Neurological Surgery

## 2021-03-10 ENCOUNTER — Inpatient Hospital Stay (HOSPITAL_COMMUNITY): Payer: Medicare Other | Admitting: Anesthesiology

## 2021-03-10 ENCOUNTER — Other Ambulatory Visit: Payer: Self-pay

## 2021-03-10 ENCOUNTER — Encounter (HOSPITAL_COMMUNITY)
Admission: RE | Disposition: A | Discharge status: Skilled Nursing Facility | Payer: Self-pay | Source: Home / Self Care | Attending: Neurological Surgery

## 2021-03-10 DIAGNOSIS — Z20822 Contact with and (suspected) exposure to covid-19: Secondary | ICD-10-CM | POA: Diagnosis present

## 2021-03-10 DIAGNOSIS — G4733 Obstructive sleep apnea (adult) (pediatric): Secondary | ICD-10-CM | POA: Diagnosis present

## 2021-03-10 DIAGNOSIS — Z23 Encounter for immunization: Secondary | ICD-10-CM | POA: Diagnosis present

## 2021-03-10 DIAGNOSIS — M2142 Flat foot [pes planus] (acquired), left foot: Secondary | ICD-10-CM | POA: Diagnosis present

## 2021-03-10 DIAGNOSIS — Z87891 Personal history of nicotine dependence: Secondary | ICD-10-CM | POA: Diagnosis not present

## 2021-03-10 DIAGNOSIS — E1143 Type 2 diabetes mellitus with diabetic autonomic (poly)neuropathy: Secondary | ICD-10-CM | POA: Diagnosis present

## 2021-03-10 DIAGNOSIS — G9340 Encephalopathy, unspecified: Secondary | ICD-10-CM | POA: Diagnosis not present

## 2021-03-10 DIAGNOSIS — Z452 Encounter for adjustment and management of vascular access device: Secondary | ICD-10-CM

## 2021-03-10 DIAGNOSIS — R609 Edema, unspecified: Secondary | ICD-10-CM | POA: Diagnosis present

## 2021-03-10 DIAGNOSIS — E1165 Type 2 diabetes mellitus with hyperglycemia: Secondary | ICD-10-CM | POA: Diagnosis present

## 2021-03-10 DIAGNOSIS — Z419 Encounter for procedure for purposes other than remedying health state, unspecified: Secondary | ICD-10-CM

## 2021-03-10 DIAGNOSIS — M960 Pseudarthrosis after fusion or arthrodesis: Secondary | ICD-10-CM | POA: Diagnosis present

## 2021-03-10 DIAGNOSIS — S32009K Unspecified fracture of unspecified lumbar vertebra, subsequent encounter for fracture with nonunion: Secondary | ICD-10-CM | POA: Diagnosis present

## 2021-03-10 DIAGNOSIS — Z794 Long term (current) use of insulin: Secondary | ICD-10-CM | POA: Diagnosis not present

## 2021-03-10 HISTORY — PX: APPLICATION OF ROBOTIC ASSISTANCE FOR SPINAL PROCEDURE: SHX6753

## 2021-03-10 LAB — BASIC METABOLIC PANEL
Anion gap: 8 (ref 5–15)
BUN: 21 mg/dL (ref 8–23)
CO2: 27 mmol/L (ref 22–32)
Calcium: 8.8 mg/dL — ABNORMAL LOW (ref 8.9–10.3)
Chloride: 102 mmol/L (ref 98–111)
Creatinine, Ser: 1.15 mg/dL — ABNORMAL HIGH (ref 0.44–1.00)
GFR, Estimated: 52 mL/min — ABNORMAL LOW (ref >60)
Glucose, Bld: 162 mg/dL — ABNORMAL HIGH (ref 70–99)
Potassium: 3.4 mmol/L — ABNORMAL LOW (ref 3.5–5.1)
Sodium: 137 mmol/L (ref 135–145)

## 2021-03-10 LAB — CBC
HCT: 38.1 % (ref 36.0–46.0)
HCT: 36.4 % (ref 36.0–46.0)
Hemoglobin: 11.8 g/dL — ABNORMAL LOW (ref 12.0–15.0)
Hemoglobin: 11.8 g/dL — ABNORMAL LOW (ref 12.0–15.0)
MCH: 28.2 pg (ref 26.0–34.0)
MCH: 29 pg (ref 26.0–34.0)
MCHC: 31 g/dL (ref 30.0–36.0)
MCHC: 32.4 g/dL (ref 30.0–36.0)
MCV: 91.1 fL (ref 80.0–100.0)
MCV: 89.4 fL (ref 80.0–100.0)
Platelets: 196 10*3/uL (ref 150–400)
Platelets: 144 10*3/uL — ABNORMAL LOW (ref 150–400)
RBC: 4.18 MIL/uL (ref 3.87–5.11)
RBC: 4.07 MIL/uL (ref 3.87–5.11)
RDW: 14.8 % (ref 11.5–15.5)
RDW: 14.8 % (ref 11.5–15.5)
WBC: 10.1 10*3/uL (ref 4.0–10.5)
WBC: 10.4 10*3/uL (ref 4.0–10.5)
nRBC: 0 % (ref 0.0–0.2)
nRBC: 0 % (ref 0.0–0.2)

## 2021-03-10 LAB — GLUCOSE, CAPILLARY
Glucose-Capillary: 147 mg/dL — ABNORMAL HIGH (ref 70–99)
Glucose-Capillary: 161 mg/dL — ABNORMAL HIGH (ref 70–99)
Glucose-Capillary: 146 mg/dL — ABNORMAL HIGH (ref 70–99)
Glucose-Capillary: 184 mg/dL — ABNORMAL HIGH (ref 70–99)

## 2021-03-10 LAB — PREPARE RBC (CROSSMATCH)

## 2021-03-10 LAB — SURGICAL PCR SCREEN
MRSA, PCR: NEGATIVE
Staphylococcus aureus: POSITIVE — AB

## 2021-03-10 LAB — SARS CORONAVIRUS 2 BY RT PCR (HOSPITAL ORDER, PERFORMED IN ~~LOC~~ HOSPITAL LAB): SARS Coronavirus 2: NEGATIVE

## 2021-03-10 LAB — HEMOGLOBIN A1C
Hgb A1c MFr Bld: 6.6 % — ABNORMAL HIGH (ref 4.8–5.6)
Mean Plasma Glucose: 142.72 mg/dL

## 2021-03-10 LAB — ABO/RH: ABO/RH(D): A NEG

## 2021-03-10 SURGERY — POSTERIOR LUMBAR FUSION 1 LEVEL
Anesthesia: General

## 2021-03-10 MED ORDER — FUROSEMIDE 40 MG PO TABS
40.0000 mg | ORAL_TABLET | Freq: Every morning | ORAL | Status: DC
  Administered 2021-03-11 – 2021-03-14 (×4): 40 mg via ORAL
  Filled 2021-03-10 (×4): qty 1

## 2021-03-10 MED ORDER — INSULIN GLARGINE-YFGN 100 UNIT/ML ~~LOC~~ SOLN
60.0000 [IU] | Freq: Every day | SUBCUTANEOUS | Status: DC
  Administered 2021-03-11 – 2021-03-14 (×4): 60 [IU] via SUBCUTANEOUS
  Filled 2021-03-10 (×4): qty 0.6

## 2021-03-10 MED ORDER — CHLORHEXIDINE GLUCONATE CLOTH 2 % EX PADS: 6.0000 | MEDICATED_PAD | Freq: Once | CUTANEOUS | Status: DC

## 2021-03-10 MED ORDER — MORPHINE SULFATE ER 15 MG PO TBCR
15.0000 mg | EXTENDED_RELEASE_TABLET | Freq: Two times a day (BID) | ORAL | Status: DC
  Administered 2021-03-10 – 2021-03-14 (×6): 15 mg via ORAL
  Filled 2021-03-10 (×7): qty 1

## 2021-03-10 MED ORDER — POTASSIUM CHLORIDE 10 MEQ/100ML IV SOLN
INTRAVENOUS | Status: DC | PRN
  Administered 2021-03-10: 10 meq via INTRAVENOUS

## 2021-03-10 MED ORDER — PHENOL 1.4 % MT LIQD: 1.0000 | OROMUCOSAL | Status: DC | PRN

## 2021-03-10 MED ORDER — LACTATED RINGERS IV SOLN: INTRAVENOUS | Status: DC | PRN

## 2021-03-10 MED ORDER — CEFAZOLIN SODIUM-DEXTROSE 2-4 GM/100ML-% IV SOLN
2.0000 g | Freq: Three times a day (TID) | INTRAVENOUS | Status: AC
  Administered 2021-03-10 – 2021-03-11 (×2): 2 g via INTRAVENOUS
  Filled 2021-03-10 (×2): qty 100

## 2021-03-10 MED ORDER — PHENYLEPHRINE HCL-NACL 20-0.9 MG/250ML-% IV SOLN
INTRAVENOUS | Status: DC | PRN
  Administered 2021-03-10: 75 ug/min via INTRAVENOUS
  Administered 2021-03-10: 80 ug/min via INTRAVENOUS

## 2021-03-10 MED ORDER — QUETIAPINE FUMARATE 50 MG PO TABS
25.0000 mg | ORAL_TABLET | Freq: Every day | ORAL | Status: DC
  Administered 2021-03-10 – 2021-03-13 (×3): 25 mg via ORAL
  Filled 2021-03-10 (×3): qty 1

## 2021-03-10 MED ORDER — MIDAZOLAM HCL 2 MG/2ML IJ SOLN
INTRAMUSCULAR | Status: AC
  Filled 2021-03-10: qty 2

## 2021-03-10 MED ORDER — PHENYLEPHRINE 40 MCG/ML (10ML) SYRINGE FOR IV PUSH (FOR BLOOD PRESSURE SUPPORT)
PREFILLED_SYRINGE | INTRAVENOUS | Status: DC | PRN
  Administered 2021-03-10: 160 ug via INTRAVENOUS
  Administered 2021-03-10: 80 ug via INTRAVENOUS
  Administered 2021-03-10: 120 ug via INTRAVENOUS

## 2021-03-10 MED ORDER — POLYETHYLENE GLYCOL 3350 17 G PO PACK: 17.0000 g | PACK | Freq: Every day | ORAL | Status: DC | PRN

## 2021-03-10 MED ORDER — LIDOCAINE 2% (20 MG/ML) 5 ML SYRINGE
INTRAMUSCULAR | Status: DC | PRN
  Administered 2021-03-10: 100 mg via INTRAVENOUS

## 2021-03-10 MED ORDER — LIDOCAINE-EPINEPHRINE 1 %-1:100000 IJ SOLN
INTRAMUSCULAR | Status: DC | PRN
  Administered 2021-03-10: 5 mL

## 2021-03-10 MED ORDER — MINERAL OIL RE ENEM
1.0000 | ENEMA | Freq: Every day | RECTAL | Status: DC | PRN
  Filled 2021-03-10: qty 1

## 2021-03-10 MED ORDER — PHENYLEPHRINE 40 MCG/ML (10ML) SYRINGE FOR IV PUSH (FOR BLOOD PRESSURE SUPPORT)
PREFILLED_SYRINGE | INTRAVENOUS | Status: AC
  Filled 2021-03-10: qty 10

## 2021-03-10 MED ORDER — SODIUM CHLORIDE 0.9% FLUSH: 3.0000 mL | INTRAVENOUS | Status: DC | PRN

## 2021-03-10 MED ORDER — FENTANYL CITRATE (PF) 250 MCG/5ML IJ SOLN
INTRAMUSCULAR | Status: AC
  Filled 2021-03-10: qty 5

## 2021-03-10 MED ORDER — SODIUM CHLORIDE 0.9 % IV SOLN
0.0000 ug/min | INTRAVENOUS | Status: DC
  Administered 2021-03-10: 25 ug/min via INTRAVENOUS

## 2021-03-10 MED ORDER — MIDODRINE HCL 5 MG PO TABS
30.0000 mg | ORAL_TABLET | Freq: Three times a day (TID) | ORAL | Status: DC
  Administered 2021-03-10 – 2021-03-14 (×10): 30 mg via ORAL
  Filled 2021-03-10 (×10): qty 6

## 2021-03-10 MED ORDER — BUPIVACAINE HCL (PF) 0.5 % IJ SOLN
INTRAMUSCULAR | Status: DC | PRN
  Administered 2021-03-10: 5 mL

## 2021-03-10 MED ORDER — ROCURONIUM BROMIDE 10 MG/ML (PF) SYRINGE
PREFILLED_SYRINGE | INTRAVENOUS | Status: DC | PRN
  Administered 2021-03-10: 20 mg via INTRAVENOUS
  Administered 2021-03-10: 70 mg via INTRAVENOUS
  Administered 2021-03-10: 30 mg via INTRAVENOUS

## 2021-03-10 MED ORDER — ACETAMINOPHEN 10 MG/ML IV SOLN
INTRAVENOUS | Status: AC
  Administered 2021-03-10: 1000 mg
  Filled 2021-03-10: qty 100

## 2021-03-10 MED ORDER — ACETAMINOPHEN 325 MG PO TABS
650.0000 mg | ORAL_TABLET | ORAL | Status: DC | PRN
  Administered 2021-03-11 – 2021-03-13 (×3): 650 mg via ORAL
  Filled 2021-03-10 (×3): qty 2

## 2021-03-10 MED ORDER — ALBUMIN HUMAN 5 % IV SOLN
12.5000 g | Freq: Once | INTRAVENOUS | Status: AC
  Administered 2021-03-10: 12.5 g via INTRAVENOUS

## 2021-03-10 MED ORDER — THROMBIN 20000 UNITS EX SOLR
CUTANEOUS | Status: AC
  Filled 2021-03-10: qty 20000

## 2021-03-10 MED ORDER — FERROUS SULFATE 325 (65 FE) MG PO TABS
325.0000 mg | ORAL_TABLET | Freq: Every morning | ORAL | Status: DC
  Administered 2021-03-11 – 2021-03-14 (×4): 325 mg via ORAL
  Filled 2021-03-10 (×4): qty 1

## 2021-03-10 MED ORDER — ROCURONIUM BROMIDE 10 MG/ML (PF) SYRINGE
PREFILLED_SYRINGE | INTRAVENOUS | Status: AC
  Filled 2021-03-10: qty 10

## 2021-03-10 MED ORDER — METOCLOPRAMIDE HCL 10 MG PO TABS
5.0000 mg | ORAL_TABLET | Freq: Three times a day (TID) | ORAL | Status: DC
  Administered 2021-03-10 – 2021-03-14 (×14): 5 mg via ORAL
  Filled 2021-03-10 (×14): qty 1

## 2021-03-10 MED ORDER — PANTOPRAZOLE SODIUM 40 MG PO TBEC
40.0000 mg | DELAYED_RELEASE_TABLET | Freq: Two times a day (BID) | ORAL | Status: DC
  Administered 2021-03-10 – 2021-03-14 (×8): 40 mg via ORAL
  Filled 2021-03-10 (×8): qty 1

## 2021-03-10 MED ORDER — CYCLOBENZAPRINE HCL 10 MG PO TABS
10.0000 mg | ORAL_TABLET | Freq: Three times a day (TID) | ORAL | Status: DC
  Administered 2021-03-11 – 2021-03-14 (×8): 10 mg via ORAL
  Filled 2021-03-10 (×9): qty 1

## 2021-03-10 MED ORDER — DOCUSATE SODIUM 100 MG PO CAPS
100.0000 mg | ORAL_CAPSULE | Freq: Every morning | ORAL | Status: DC
  Administered 2021-03-12 – 2021-03-13 (×2): 100 mg via ORAL
  Filled 2021-03-10 (×3): qty 1

## 2021-03-10 MED ORDER — FENTANYL CITRATE (PF) 100 MCG/2ML IJ SOLN: 25.0000 ug | INTRAMUSCULAR | Status: DC | PRN

## 2021-03-10 MED ORDER — CHLORHEXIDINE GLUCONATE CLOTH 2 % EX PADS
6.0000 | MEDICATED_PAD | Freq: Every day | CUTANEOUS | Status: DC
  Administered 2021-03-10 – 2021-03-14 (×5): 6 via TOPICAL

## 2021-03-10 MED ORDER — EPHEDRINE SULFATE-NACL 50-0.9 MG/10ML-% IV SOSY
PREFILLED_SYRINGE | INTRAVENOUS | Status: DC | PRN
  Administered 2021-03-10 (×3): 5 mg via INTRAVENOUS

## 2021-03-10 MED ORDER — SODIUM CHLORIDE 0.9% FLUSH
3.0000 mL | Freq: Two times a day (BID) | INTRAVENOUS | Status: DC
  Administered 2021-03-10 – 2021-03-14 (×8): 3 mL via INTRAVENOUS

## 2021-03-10 MED ORDER — SODIUM CHLORIDE 0.9 % IV SOLN: INTRAVENOUS | Status: DC | PRN

## 2021-03-10 MED ORDER — LIDOCAINE-EPINEPHRINE 1 %-1:100000 IJ SOLN
INTRAMUSCULAR | Status: AC
  Filled 2021-03-10: qty 1

## 2021-03-10 MED ORDER — HYDROMORPHONE HCL 1 MG/ML IJ SOLN: 1.0000 mg | INTRAMUSCULAR | Status: DC | PRN

## 2021-03-10 MED ORDER — 0.9 % SODIUM CHLORIDE (POUR BTL) OPTIME
TOPICAL | Status: DC | PRN
  Administered 2021-03-10: 1000 mL

## 2021-03-10 MED ORDER — EPHEDRINE 5 MG/ML INJ
INTRAVENOUS | Status: AC
  Filled 2021-03-10: qty 5

## 2021-03-10 MED ORDER — ALBUMIN HUMAN 5 % IV SOLN
INTRAVENOUS | Status: AC
  Filled 2021-03-10: qty 250

## 2021-03-10 MED ORDER — ACETAMINOPHEN 10 MG/ML IV SOLN: 1000.0000 mg | Freq: Once | INTRAVENOUS | Status: DC

## 2021-03-10 MED ORDER — FENTANYL CITRATE (PF) 250 MCG/5ML IJ SOLN
INTRAMUSCULAR | Status: DC | PRN
  Administered 2021-03-10: 100 ug via INTRAVENOUS

## 2021-03-10 MED ORDER — ADULT MULTIVITAMIN W/MINERALS CH
1.0000 | ORAL_TABLET | Freq: Every morning | ORAL | Status: DC
Start: 2021-03-11 — End: 2021-03-14
  Administered 2021-03-11 – 2021-03-14 (×4): 1 via ORAL
  Filled 2021-03-10 (×4): qty 1

## 2021-03-10 MED ORDER — GABAPENTIN 300 MG PO CAPS
600.0000 mg | ORAL_CAPSULE | Freq: Three times a day (TID) | ORAL | Status: DC
  Administered 2021-03-10 – 2021-03-14 (×9): 600 mg via ORAL
  Filled 2021-03-10 (×10): qty 2

## 2021-03-10 MED ORDER — POLYETHYLENE GLYCOL 3350 17 G PO PACK
17.0000 g | PACK | Freq: Every morning | ORAL | Status: DC
  Administered 2021-03-12 – 2021-03-13 (×2): 17 g via ORAL
  Filled 2021-03-10 (×3): qty 1

## 2021-03-10 MED ORDER — ONDANSETRON HCL 4 MG PO TABS: 4.0000 mg | ORAL_TABLET | Freq: Four times a day (QID) | ORAL | Status: DC | PRN

## 2021-03-10 MED ORDER — MENTHOL 3 MG MT LOZG: 1.0000 | LOZENGE | OROMUCOSAL | Status: DC | PRN

## 2021-03-10 MED ORDER — EPHEDRINE SULFATE 50 MG/ML IJ SOLN: INTRAMUSCULAR | Status: DC | PRN

## 2021-03-10 MED ORDER — DEXAMETHASONE SODIUM PHOSPHATE 10 MG/ML IJ SOLN
INTRAMUSCULAR | Status: AC
  Filled 2021-03-10: qty 1

## 2021-03-10 MED ORDER — ALBUMIN HUMAN 5 % IV SOLN: INTRAVENOUS | Status: DC | PRN

## 2021-03-10 MED ORDER — POTASSIUM CHLORIDE CRYS ER 20 MEQ PO TBCR
20.0000 meq | EXTENDED_RELEASE_TABLET | Freq: Every morning | ORAL | Status: DC
  Administered 2021-03-10 – 2021-03-14 (×5): 20 meq via ORAL
  Filled 2021-03-10 (×5): qty 1

## 2021-03-10 MED ORDER — PROPOFOL 10 MG/ML IV BOLUS
INTRAVENOUS | Status: DC | PRN
  Administered 2021-03-10: 80 mg via INTRAVENOUS

## 2021-03-10 MED ORDER — SERTRALINE HCL 100 MG PO TABS
100.0000 mg | ORAL_TABLET | Freq: Every morning | ORAL | Status: DC
  Administered 2021-03-11 – 2021-03-14 (×4): 100 mg via ORAL
  Filled 2021-03-10 (×4): qty 1

## 2021-03-10 MED ORDER — ACETAMINOPHEN 10 MG/ML IV SOLN: 1000.0000 mg | Freq: Once | INTRAVENOUS | Status: DC | PRN

## 2021-03-10 MED ORDER — OXYCODONE HCL 5 MG/5ML PO SOLN: 5.0000 mg | Freq: Once | ORAL | Status: DC | PRN

## 2021-03-10 MED ORDER — INSULIN ASPART 100 UNIT/ML IJ SOLN
0.0000 [IU] | Freq: Three times a day (TID) | INTRAMUSCULAR | Status: DC
  Administered 2021-03-11: 7 [IU] via SUBCUTANEOUS
  Administered 2021-03-11: 4 [IU] via SUBCUTANEOUS
  Administered 2021-03-11: 3 [IU] via SUBCUTANEOUS
  Administered 2021-03-12: 4 [IU] via SUBCUTANEOUS
  Administered 2021-03-12 (×2): 3 [IU] via SUBCUTANEOUS
  Administered 2021-03-13 (×2): 7 [IU] via SUBCUTANEOUS
  Administered 2021-03-14 (×2): 4 [IU] via SUBCUTANEOUS

## 2021-03-10 MED ORDER — DOXYCYCLINE HYCLATE 100 MG PO TABS
100.0000 mg | ORAL_TABLET | Freq: Two times a day (BID) | ORAL | Status: DC
  Administered 2021-03-10 – 2021-03-14 (×8): 100 mg via ORAL
  Filled 2021-03-10 (×8): qty 1

## 2021-03-10 MED ORDER — ATORVASTATIN CALCIUM 40 MG PO TABS
40.0000 mg | ORAL_TABLET | Freq: Every day | ORAL | Status: DC
  Administered 2021-03-10 – 2021-03-13 (×3): 40 mg via ORAL
  Filled 2021-03-10 (×3): qty 1

## 2021-03-10 MED ORDER — MAGNESIUM SULFATE 50 % IJ SOLN
INTRAVENOUS | Status: DC | PRN
  Administered 2021-03-10: 2 g via INTRAVENOUS

## 2021-03-10 MED ORDER — CALCIUM CHLORIDE 10 % IV SOLN
INTRAVENOUS | Status: AC
  Filled 2021-03-10: qty 10

## 2021-03-10 MED ORDER — TEMAZEPAM 15 MG PO CAPS
30.0000 mg | ORAL_CAPSULE | Freq: Every day | ORAL | Status: DC
  Administered 2021-03-10 – 2021-03-13 (×3): 30 mg via ORAL
  Filled 2021-03-10 (×3): qty 2

## 2021-03-10 MED ORDER — OXYCODONE HCL 5 MG PO TABS
5.0000 mg | ORAL_TABLET | ORAL | Status: DC | PRN
  Administered 2021-03-12 (×4): 5 mg via ORAL
  Filled 2021-03-10 (×4): qty 1

## 2021-03-10 MED ORDER — THROMBIN 5000 UNITS EX SOLR
OROMUCOSAL | Status: DC | PRN
  Administered 2021-03-10: 5 mL via TOPICAL

## 2021-03-10 MED ORDER — ONDANSETRON HCL 4 MG/2ML IJ SOLN: 4.0000 mg | Freq: Four times a day (QID) | INTRAMUSCULAR | Status: DC | PRN

## 2021-03-10 MED ORDER — SUGAMMADEX SODIUM 200 MG/2ML IV SOLN
INTRAVENOUS | Status: DC | PRN
  Administered 2021-03-10: 200 mg via INTRAVENOUS

## 2021-03-10 MED ORDER — CEFAZOLIN SODIUM-DEXTROSE 2-4 GM/100ML-% IV SOLN
2.0000 g | INTRAVENOUS | Status: AC
  Administered 2021-03-10: 2 g via INTRAVENOUS
  Filled 2021-03-10: qty 100

## 2021-03-10 MED ORDER — ACETAMINOPHEN 500 MG PO TABS: 1000.0000 mg | ORAL_TABLET | Freq: Once | ORAL | Status: DC | PRN

## 2021-03-10 MED ORDER — MUPIROCIN 2 % EX OINT
1.0000 "application " | TOPICAL_OINTMENT | Freq: Two times a day (BID) | CUTANEOUS | Status: DC
  Administered 2021-03-10 – 2021-03-14 (×8): 1 via NASAL
  Filled 2021-03-10 (×3): qty 22

## 2021-03-10 MED ORDER — CHLORHEXIDINE GLUCONATE 0.12 % MT SOLN: 15.0000 mL | Freq: Once | OROMUCOSAL | Status: AC

## 2021-03-10 MED ORDER — SODIUM CHLORIDE 0.9 % IV SOLN
250.0000 mL | INTRAVENOUS | Status: DC
  Administered 2021-03-10: 250 mL via INTRAVENOUS

## 2021-03-10 MED ORDER — ONDANSETRON HCL 4 MG/2ML IJ SOLN
INTRAMUSCULAR | Status: AC
  Filled 2021-03-10: qty 2

## 2021-03-10 MED ORDER — OXYCODONE HCL 5 MG PO TABS
10.0000 mg | ORAL_TABLET | ORAL | Status: DC | PRN
  Administered 2021-03-10 – 2021-03-13 (×4): 10 mg via ORAL
  Filled 2021-03-10 (×5): qty 2

## 2021-03-10 MED ORDER — OXYCODONE HCL 5 MG PO TABS: 5.0000 mg | ORAL_TABLET | Freq: Once | ORAL | Status: DC | PRN

## 2021-03-10 MED ORDER — SODIUM CHLORIDE 0.9% IV SOLUTION: Freq: Once | INTRAVENOUS | Status: DC

## 2021-03-10 MED ORDER — BISACODYL 10 MG RE SUPP: 10.0000 mg | Freq: Every day | RECTAL | Status: DC | PRN

## 2021-03-10 MED ORDER — LIDOCAINE 2% (20 MG/ML) 5 ML SYRINGE
INTRAMUSCULAR | Status: AC
  Filled 2021-03-10: qty 5

## 2021-03-10 MED ORDER — CHLORHEXIDINE GLUCONATE 0.12 % MT SOLN
OROMUCOSAL | Status: AC
  Administered 2021-03-10: 15 mL via OROMUCOSAL
  Filled 2021-03-10: qty 15

## 2021-03-10 MED ORDER — THROMBIN 5000 UNITS EX SOLR
CUTANEOUS | Status: AC
  Filled 2021-03-10: qty 5000

## 2021-03-10 MED ORDER — BUPIVACAINE HCL (PF) 0.5 % IJ SOLN
INTRAMUSCULAR | Status: AC
  Filled 2021-03-10: qty 30

## 2021-03-10 MED ORDER — ACETAMINOPHEN 650 MG RE SUPP: 650.0000 mg | RECTAL | Status: DC | PRN

## 2021-03-10 MED ORDER — CYCLOBENZAPRINE HCL 10 MG PO TABS: 10.0000 mg | ORAL_TABLET | Freq: Three times a day (TID) | ORAL | Status: DC | PRN

## 2021-03-10 MED ORDER — PROPOFOL 10 MG/ML IV BOLUS
INTRAVENOUS | Status: AC
  Filled 2021-03-10: qty 20

## 2021-03-10 MED ORDER — ALBUTEROL SULFATE (2.5 MG/3ML) 0.083% IN NEBU: 2.5000 mg | INHALATION_SOLUTION | Freq: Three times a day (TID) | RESPIRATORY_TRACT | Status: DC | PRN

## 2021-03-10 MED ORDER — ACETAMINOPHEN 160 MG/5ML PO SOLN: 1000.0000 mg | Freq: Once | ORAL | Status: DC | PRN

## 2021-03-10 MED ORDER — ONDANSETRON HCL 4 MG/2ML IJ SOLN
INTRAMUSCULAR | Status: DC | PRN
  Administered 2021-03-10: 4 mg via INTRAVENOUS

## 2021-03-10 MED ORDER — CALCIUM CHLORIDE 10 % IV SOLN
INTRAVENOUS | Status: DC | PRN
  Administered 2021-03-10 (×3): 100 mg via INTRAVENOUS

## 2021-03-10 SURGICAL SUPPLY — 85 items
BAG COUNTER SPONGE SURGICOUNT (BAG) ×4 IMPLANT
BASKET BONE COLLECTION (BASKET) ×2 IMPLANT
BENZOIN TINCTURE PRP APPL 2/3 (GAUZE/BANDAGES/DRESSINGS) IMPLANT
BIT DRILL LONG 3.0X30 (BIT) IMPLANT
BIT DRILL LONG 3X80 (BIT) IMPLANT
BIT DRILL LONG 4X80 (BIT) ×2 IMPLANT
BIT DRILL SHORT 3.0X30 (BIT) IMPLANT
BIT DRILL SHORT 3X80 (BIT) IMPLANT
BLADE CLIPPER SURG (BLADE) IMPLANT
BLADE SURG 11 STRL SS (BLADE) ×4 IMPLANT
BUR MATCHSTICK NEURO 3.0 LAGG (BURR) ×2 IMPLANT
BUR PRECISION FLUTE 5.0 (BURR) ×2 IMPLANT
CANISTER SUCT 3000ML PPV (MISCELLANEOUS) ×2 IMPLANT
CNTNR URN SCR LID CUP LEK RST (MISCELLANEOUS) ×1 IMPLANT
CONT SPEC 4OZ STRL OR WHT (MISCELLANEOUS) ×1
COVER BACK TABLE 60X90IN (DRAPES) ×2 IMPLANT
DECANTER SPIKE VIAL GLASS SM (MISCELLANEOUS) ×2 IMPLANT
DERMABOND ADVANCED (GAUZE/BANDAGES/DRESSINGS) ×1
DERMABOND ADVANCED .7 DNX12 (GAUZE/BANDAGES/DRESSINGS) ×1 IMPLANT
DRAPE C-ARM 42X72 X-RAY (DRAPES) ×2 IMPLANT
DRAPE C-ARMOR (DRAPES) ×2 IMPLANT
DRAPE LAPAROTOMY 100X72X124 (DRAPES) ×2 IMPLANT
DRAPE SHEET LG 3/4 BI-LAMINATE (DRAPES) ×2 IMPLANT
DRAPE SURG 17X23 STRL (DRAPES) ×2 IMPLANT
DURAPREP 26ML APPLICATOR (WOUND CARE) ×2 IMPLANT
ELECT BLADE 4.0 EZ CLEAN MEGAD (MISCELLANEOUS)
ELECT REM PT RETURN 9FT ADLT (ELECTROSURGICAL) ×2
ELECTRODE BLDE 4.0 EZ CLN MEGD (MISCELLANEOUS) IMPLANT
ELECTRODE REM PT RTRN 9FT ADLT (ELECTROSURGICAL) ×1 IMPLANT
GAUZE 4X4 16PLY ~~LOC~~+RFID DBL (SPONGE) ×2 IMPLANT
GAUZE SPONGE 4X4 12PLY STRL (GAUZE/BANDAGES/DRESSINGS) IMPLANT
GLOVE EXAM NITRILE LRG STRL (GLOVE) IMPLANT
GLOVE EXAM NITRILE XL STR (GLOVE) IMPLANT
GLOVE EXAM NITRILE XS STR PU (GLOVE) IMPLANT
GLOVE SURG LTX SZ7.5 (GLOVE) IMPLANT
GLOVE SURG POLYISO LF SZ6.5 (GLOVE) ×2 IMPLANT
GLOVE SURG POLYISO LF SZ7 (GLOVE) ×4 IMPLANT
GLOVE SURG POLYISO LF SZ7.5 (GLOVE) ×8 IMPLANT
GLOVE SURG UNDER POLY LF SZ7.5 (GLOVE) ×4 IMPLANT
GOWN STRL REUS W/ TWL LRG LVL3 (GOWN DISPOSABLE) ×4 IMPLANT
GOWN STRL REUS W/ TWL XL LVL3 (GOWN DISPOSABLE) ×2 IMPLANT
GOWN STRL REUS W/TWL 2XL LVL3 (GOWN DISPOSABLE) IMPLANT
GOWN STRL REUS W/TWL LRG LVL3 (GOWN DISPOSABLE) ×4
GOWN STRL REUS W/TWL XL LVL3 (GOWN DISPOSABLE) ×2
GRAFT BN 10X1XDBM MAGNIFUSE (Bone Implant) ×1 IMPLANT
GRAFT BONE MAGNIFUSE 1X10CM (Bone Implant) ×1 IMPLANT
GUIDEWIRE BLUNT NT 450 (WIRE) ×4 IMPLANT
HEMOSTAT POWDER KIT SURGIFOAM (HEMOSTASIS) ×2 IMPLANT
KIT BASIN OR (CUSTOM PROCEDURE TRAY) ×2 IMPLANT
KIT INFUSE SMALL (Orthopedic Implant) ×2 IMPLANT
KIT POSITION SURG JACKSON T1 (MISCELLANEOUS) ×2 IMPLANT
KIT SPINE MAZOR X ROBO DISP (MISCELLANEOUS) ×2 IMPLANT
KIT TURNOVER KIT B (KITS) ×2 IMPLANT
MILL MEDIUM DISP (BLADE) IMPLANT
NEEDLE HYPO 18GX1.5 BLUNT FILL (NEEDLE) IMPLANT
NEEDLE HYPO 22GX1.5 SAFETY (NEEDLE) ×2 IMPLANT
NEEDLE SPNL 18GX3.5 QUINCKE PK (NEEDLE) IMPLANT
NS IRRIG 1000ML POUR BTL (IV SOLUTION) ×2 IMPLANT
PACK LAMINECTOMY NEURO (CUSTOM PROCEDURE TRAY) ×2 IMPLANT
PAD ARMBOARD 7.5X6 YLW CONV (MISCELLANEOUS) ×4 IMPLANT
PIN HEAD 2.5X60MM (PIN) IMPLANT
RASP 3.0MM (RASP) ×2 IMPLANT
ROD 5.5MM SPINAL SOLERA (Rod) ×2 IMPLANT
SCREW CANN MA SOLERA 8.5X70 (Screw) ×4 IMPLANT
SCREW CANN POST SOLERA 8.5X30 (Screw) ×4 IMPLANT
SCREW SCHANZ SA 4.0MM (MISCELLANEOUS) IMPLANT
SCREW SET SOLERA (Screw) ×4 IMPLANT
SCREW SET SOLERA TI5.5 (Screw) ×4 IMPLANT
SCREW SOLERA 8.5X40 (Screw) ×2 IMPLANT
SPONGE SURGIFOAM ABS GEL 100 (HEMOSTASIS) IMPLANT
SPONGE T-LAP 4X18 ~~LOC~~+RFID (SPONGE) ×2 IMPLANT
STRIP CLOSURE SKIN 1/2X4 (GAUZE/BANDAGES/DRESSINGS) IMPLANT
SURGILUBE 2OZ TUBE FLIPTOP (MISCELLANEOUS) ×2 IMPLANT
SUT MNCRL AB 3-0 PS2 18 (SUTURE) ×2 IMPLANT
SUT VIC AB 0 CT1 18XCR BRD8 (SUTURE) ×2 IMPLANT
SUT VIC AB 0 CT1 8-18 (SUTURE) ×2
SUT VIC AB 2-0 CP2 18 (SUTURE) ×4 IMPLANT
SYR 3ML LL SCALE MARK (SYRINGE) IMPLANT
TOWEL GREEN STERILE (TOWEL DISPOSABLE) ×2 IMPLANT
TOWEL GREEN STERILE FF (TOWEL DISPOSABLE) ×2 IMPLANT
TRAY FOL W/BAG SLVR 16FR STRL (SET/KITS/TRAYS/PACK) ×1 IMPLANT
TRAY FOLEY MTR SLVR 16FR STAT (SET/KITS/TRAYS/PACK) IMPLANT
TRAY FOLEY W/BAG SLVR 16FR LF (SET/KITS/TRAYS/PACK) ×1
TUBE MAZOR SA REDUCTION (TUBING) ×2 IMPLANT
WATER STERILE IRR 1000ML POUR (IV SOLUTION) ×2 IMPLANT

## 2021-03-10 NOTE — Op Note (Signed)
PATIENT: Tonya Baker  DAY OF SURGERY: 03/10/21   PRE-OPERATIVE DIAGNOSIS:  L5-S1 pseudoarthrosis   POST-OPERATIVE DIAGNOSIS:  Same   PROCEDURE:  Removal of bilateral S1 screws, bilateral rods, replacement of bilateral S1 screws, placement of Ashkar bilateral S2AI screws with robotic stereotactic assistance, L4 to pelvis posterolateral fusion   SURGEON:  Surgeon(s) and Role:    [name], MD - Primary    Coletta Memos, MD - Assisting   ANESTHESIA: ETGA   BRIEF HISTORY: This is a 70 year old woman who presented with severe low back pain. She previously had a lumbosacral fusion at another institution and subsequently developed a symptomatic pseudoarthrosis after moving locally. This was discussed with the patient as well as risks, benefits, and alternatives and she wished to proceed with surgery. Given her prior pseudoarthrosis despite anterior and posterior instrumentation, we also discussed the potential for a repeat pseudoarthrosis and that this would be a very difficult situation.   OPERATIVE DETAIL:  The patient was taken to the operating room and placed on the OR table in the prone position. A formal time out was performed with two patient identifiers and confirmed the operative site. Anesthesia was induced by the anesthesia team. The operative site was marked, hair was clipped with surgical clippers, the area was then prepped and draped in a sterile fashion.   The patient's prior incision was opened and soft tissues were dissected and the prior hardware was exposed. The patient's prior incision was significantly to the right of midline, making left sided dissection much more difficult. I therefore opened the entirety of the incision to allow for dissection far enough laterally. Given this, I decided to replace the entire rod instead of cutting the rod.   A spinous process clamp was placed and the Mazor robot was attached to the table, draped in a sterile fashion, and attached to the  spinous process clamp. S2AI screws were planned preoperatively on the Mazor planning software on the Mazor station. This plan was reviewed, fluoroscopy was brought in, and the Mazor was registered. The fit was acceptable and the robotic arm was moved into place for the S2AI screws. These were placed by a navigated drill followed by K-wire placement. This was then used to guide a cannulated awl-tap. This was palpated without evidence of breach so the screw was placed.   I replaced the S1 screws with one size larger screws with good purchase. This was done by removing the screws, palpating for the end of the pedicle tract on the left and measuring on the right. On the right, the prior trajectory led into the pelvis and likely right into or near the location of the iliac vein, so I used a shorter replacement and left both of the S1 screws proud to allow them to hook into the Lagace construct.   For the fusion, dissection was taken down to the remaining facet material and sacrum. Magnafuse allograft (Medtr[employer]) along with BMP was placed along the fusion surfaces to perform a fusion from L4 to the pelvis. The rods were then placed from L1 to the pelvis to help secure the bone graft in place and the old caps were placed on the old screws followed by Escareno caps on the Marquart screws. Hemostasis was confirmed, the wound was copiously irrigated prior to the BMP placement, and the incision was closed in layers after counts were confirmed to be correct.    EBL:    DRAINS: none   SPECIMENS: none  [name], MD 03/10/21 8:31 AM

## 2021-03-10 NOTE — H&P (Signed)
Surgical H&P Update  HPI: 70 y.o. woman with severe back pain, prior discitis s/p L1-S1 fusion by another surgeon at another facility, presented to me with worsening back pain, workup showed a pseudoarthrosis at L5-S1. She also has chronic weakness of the left foot. No changes in health since she was last seen. Still having severe back pain and wishes to proceed with surgery.  PMHx:  Past Medical History:  Diagnosis Date   Back pain    Obstructive sleep apnea    Type 2 diabetes mellitus with diabetic autonomic neuropathy, with long-term current use of insulin (HCC)    FamHx: History reviewed. No pertinent family history. SocHx:  reports that she quit smoking about 32 years ago. Her smoking use included cigarettes. She has never used smokeless tobacco. No history on file for alcohol use and drug use.  Physical Exam: Strength 5/5 x4 except 4/5 in L EHL/DF, 4-/5 in PF, SILTx4 except bilateral stocking numbness  Assesment/Plan: 70 y.o. woman with L5-S1 pseudoarthrosis, here for open revision and extension to the pelvis. Risks, benefits, and alternatives discussed and the patient would like to continue with surgery.  -OR today -4NP post-op  [name], MD 03/10/21 7:43 AM

## 2021-03-10 NOTE — Plan of Care (Signed)
  Problem: Education: Goal: Knowledge of General Education information will improve Description Including pain rating scale, medication(s)/side effects and non-pharmacologic comfort measures Outcome: Progressing   Problem: Health Behavior/Discharge Planning: Goal: Ability to manage health-related needs will improve Outcome: Progressing   

## 2021-03-10 NOTE — Anesthesia Procedure Notes (Addendum)
Procedure Name: Intubation Date/Time: 03/10/2021 8:20 AM Performed by: Nils Pyle, CRNA Pre-anesthesia Checklist: Patient identified, Emergency Drugs available, Suction available and Patient being monitored Patient Re-evaluated:Patient Re-evaluated prior to induction Oxygen Delivery Method: Circle System Utilized Preoxygenation: Pre-oxygenation with 100% oxygen Induction Type: IV induction Ventilation: Mask ventilation without difficulty Laryngoscope Size: Glidescope and 3 Grade View: Grade I Tube type: Oral Tube size: 7.5 mm Number of attempts: 1 Airway Equipment and Method: Stylet and Oral airway Placement Confirmation: ETT inserted through vocal cords under direct vision, positive ETCO2 and breath sounds checked- equal and bilateral Secured at: 22 cm Tube secured with: Tape Dental Injury: Teeth and Oropharynx as per pre-operative assessment  Comments: Intubated by Tonna Corner, Ludwig Clarks

## 2021-03-10 NOTE — Anesthesia Procedure Notes (Signed)
Central Venous Catheter Insertion Performed by: Val Eagle, MD, anesthesiologist Start/End12/08/2020 8:18 AM, 03/10/2021 8:24 AM Patient location: OR. Preanesthetic checklist: patient identified, IV checked, site marked, risks and benefits discussed, surgical consent, monitors and equipment checked, pre-op evaluation, timeout performed and anesthesia consent Position: supine Patient sedated Hand hygiene performed  and maximum sterile barriers used  Catheter size: 8 Fr Total catheter length 16. Central line was placed.Double lumen Procedure performed using ultrasound guided technique. Ultrasound Notes:anatomy identified, needle tip was noted to be adjacent to the nerve/plexus identified, no ultrasound evidence of intravascular and/or intraneural injection and image(s) printed for medical record Attempts: 1 Following insertion, dressing applied, line sutured and Biopatch. Post procedure assessment: blood return through all ports, free fluid flow and no air  Patient tolerated the procedure well with no immediate complications.

## 2021-03-10 NOTE — Transfer of Care (Signed)
Immediate Anesthesia Transfer of Care Note  Patient: Tonya Baker  Procedure(s) Performed: Revision of Lumbar five-Sacral one pseudoarthrosis with extension of instrumented fusion from Lumbar four to pelvis with mazor APPLICATION OF ROBOTIC ASSISTANCE FOR SPINAL PROCEDURE  Patient Location: PACU  Anesthesia Type:General  Level of Consciousness: awake, alert  and drowsy  Airway & Oxygen Therapy: Patient Spontanous Breathing and Patient connected to face mask oxygen  Post-op Assessment: Report given to RN, Post -op Vital signs reviewed and stable and Patient moving all extremities X 4  Post vital signs: Reviewed and stable  Last Vitals:  Vitals Value Taken Time  BP 100/82   Temp    Pulse 81   Resp 16   SpO2 98     Last Pain:  Vitals:   03/10/21 0549  TempSrc: Oral         Complications: No notable events documented.

## 2021-03-10 NOTE — Anesthesia Preprocedure Evaluation (Addendum)
Anesthesia Evaluation  Patient identified by MRN, date of birth, ID band Patient awake    Reviewed: Allergy & Precautions, NPO status , Patient's Chart, lab work & pertinent test results  History of Anesthesia Complications Negative for: history of anesthetic complications  Airway Mallampati: III  TM Distance: >3 FB Neck ROM: Full  Mouth opening: Limited Mouth Opening  Dental  (+) Dental Advisory Given   Pulmonary sleep apnea , former smoker,    breath sounds clear to auscultation       Cardiovascular negative cardio ROS   Rhythm:Regular  1. Left ventricular ejection fraction, by estimation, is 60 to 65%. The  left ventricle has normal function. The left ventricle has no regional  wall motion abnormalities. The left ventricular internal cavity size was  mildly dilated. There is mild left  ventricular hypertrophy. Left ventricular diastolic parameters are  consistent with Grade II diastolic dysfunction (pseudonormalization).  2. Right ventricular systolic function is normal. The right ventricular  size is normal. There is normal pulmonary artery systolic pressure. The  estimated right ventricular systolic pressure is 35.9 mmHg.  3. The mitral valve is normal in structure. No evidence of mitral valve  regurgitation. No evidence of mitral stenosis.  4. The aortic valve is normal in structure. Aortic valve regurgitation is  not visualized. No aortic stenosis is present.  5. The inferior vena cava is dilated in size with >50% respiratory  variability, suggesting right atrial pressure of 8 mmHg.    Neuro/Psych negative neurological ROS  negative psych ROS   GI/Hepatic negative GI ROS, Neg liver ROS,   Endo/Other  diabetes, Insulin Dependent  Renal/GU CRFRenal diseaseLab Results      Component                Value               Date                      CREATININE               1.25 (H)            01/07/2021                 Musculoskeletal negative musculoskeletal ROS (+)   Abdominal   Peds  Hematology negative hematology ROS (+)   Anesthesia Other Findings   Reproductive/Obstetrics                            Anesthesia Physical Anesthesia Plan  ASA: 3  Anesthesia Plan: General   Post-op Pain Management:    Induction: Intravenous  PONV Risk Score and Plan: 3 and Ondansetron and Dexamethasone  Airway Management Planned: Oral ETT  Additional Equipment: CVP and Ultrasound Guidance Line Placement  Intra-op Plan:   Post-operative Plan: Extubation in OR  Informed Consent: I have reviewed the patients History and Physical, chart, labs and discussed the procedure including the risks, benefits and alternatives for the proposed anesthesia with the patient or authorized representative who has indicated his/her understanding and acceptance.     Dental advisory given  Plan Discussed with: CRNA and Anesthesiologist  Anesthesia Plan Comments:         Anesthesia Quick Evaluation

## 2021-03-11 ENCOUNTER — Inpatient Hospital Stay (HOSPITAL_COMMUNITY): Payer: Medicare Other

## 2021-03-11 LAB — GLUCOSE, CAPILLARY
Glucose-Capillary: 176 mg/dL — ABNORMAL HIGH (ref 70–99)
Glucose-Capillary: 210 mg/dL — ABNORMAL HIGH (ref 70–99)
Glucose-Capillary: 149 mg/dL — ABNORMAL HIGH (ref 70–99)
Glucose-Capillary: 85 mg/dL (ref 70–99)

## 2021-03-11 LAB — POCT I-STAT, CHEM 8
BUN: 19 mg/dL (ref 8–23)
Calcium, Ion: 1.17 mmol/L (ref 1.15–1.40)
Chloride: 102 mmol/L (ref 98–111)
Creatinine, Ser: 0.9 mg/dL (ref 0.44–1.00)
Glucose, Bld: 143 mg/dL — ABNORMAL HIGH (ref 70–99)
HCT: 29 % — ABNORMAL LOW (ref 36.0–46.0)
Hemoglobin: 9.9 g/dL — ABNORMAL LOW (ref 12.0–15.0)
Potassium: 3.3 mmol/L — ABNORMAL LOW (ref 3.5–5.1)
Sodium: 142 mmol/L (ref 135–145)
TCO2: 27 mmol/L (ref 22–32)

## 2021-03-11 LAB — PREPARE FRESH FROZEN PLASMA

## 2021-03-11 LAB — POCT I-STAT 7, (LYTES, BLD GAS, ICA,H+H)
Acid-Base Excess: 3 mmol/L — ABNORMAL HIGH (ref 0.0–2.0)
Bicarbonate: 28 mmol/L (ref 20.0–28.0)
Calcium, Ion: 1.18 mmol/L (ref 1.15–1.40)
HCT: 33 % — ABNORMAL LOW (ref 36.0–46.0)
Hemoglobin: 11.2 g/dL — ABNORMAL LOW (ref 12.0–15.0)
O2 Saturation: 94 %
Potassium: 4.2 mmol/L (ref 3.5–5.1)
Sodium: 138 mmol/L (ref 135–145)
TCO2: 29 mmol/L (ref 22–32)
pCO2 arterial: 45.3 mmHg (ref 32.0–48.0)
pH, Arterial: 7.399 (ref 7.350–7.450)
pO2, Arterial: 71 mmHg — ABNORMAL LOW (ref 83.0–108.0)

## 2021-03-11 LAB — BPAM RBC
Blood Product Expiration Date: 202212302359
Blood Product Expiration Date: 202212302359
ISSUE DATE / TIME: 202212051231
ISSUE DATE / TIME: 202212051231
Unit Type and Rh: 600
Unit Type and Rh: 600

## 2021-03-11 LAB — TYPE AND SCREEN
ABO/RH(D): A NEG
Antibody Screen: NEGATIVE
Unit division: 0
Unit division: 0

## 2021-03-11 LAB — CBC
HCT: 33.3 % — ABNORMAL LOW (ref 36.0–46.0)
Hemoglobin: 10.7 g/dL — ABNORMAL LOW (ref 12.0–15.0)
MCH: 28.4 pg (ref 26.0–34.0)
MCHC: 32.1 g/dL (ref 30.0–36.0)
MCV: 88.3 fL (ref 80.0–100.0)
Platelets: 129 10*3/uL — ABNORMAL LOW (ref 150–400)
RBC: 3.77 MIL/uL — ABNORMAL LOW (ref 3.87–5.11)
RDW: 15.4 % (ref 11.5–15.5)
WBC: 8.4 10*3/uL (ref 4.0–10.5)
nRBC: 0 % (ref 0.0–0.2)

## 2021-03-11 LAB — BASIC METABOLIC PANEL
Anion gap: 9 (ref 5–15)
BUN: 13 mg/dL (ref 8–23)
CO2: 28 mmol/L (ref 22–32)
Calcium: 8.7 mg/dL — ABNORMAL LOW (ref 8.9–10.3)
Chloride: 100 mmol/L (ref 98–111)
Creatinine, Ser: 0.98 mg/dL (ref 0.44–1.00)
GFR, Estimated: >60 mL/min (ref >60)
Glucose, Bld: 190 mg/dL — ABNORMAL HIGH (ref 70–99)
Potassium: 4.2 mmol/L (ref 3.5–5.1)
Sodium: 137 mmol/L (ref 135–145)

## 2021-03-11 LAB — BPAM FFP
Blood Product Expiration Date: 202212072359
ISSUE DATE / TIME: 202212051231
Unit Type and Rh: 6200

## 2021-03-11 NOTE — Anesthesia Postprocedure Evaluation (Signed)
Anesthesia Post Note  Patient: Tonya Baker  Procedure(s) Performed: Revision of Lumbar five-Sacral one pseudoarthrosis with extension of instrumented fusion from Lumbar four to pelvis with mazor APPLICATION OF ROBOTIC ASSISTANCE FOR SPINAL PROCEDURE     Patient location during evaluation: PACU Anesthesia Type: General Level of consciousness: awake and alert Pain management: pain level controlled Vital Signs Assessment: post-procedure vital signs reviewed and stable Respiratory status: spontaneous breathing, nonlabored ventilation, respiratory function stable and patient connected to nasal cannula oxygen Cardiovascular status: blood pressure returned to baseline and stable Postop Assessment: no apparent nausea or vomiting Anesthetic complications: no   No notable events documented.  Last Vitals:  Vitals:   03/11/21 1223 03/11/21 1536  BP: 125/61 (!) 115/59  Pulse: 87 89  Resp: 14 20  Temp: 36.9 C 36.9 C  SpO2: 95% 93%    Last Pain:  Vitals:   03/11/21 1536  TempSrc: Oral  PainSc:                  Tonya Baker

## 2021-03-11 NOTE — Plan of Care (Signed)

## 2021-03-11 NOTE — Progress Notes (Signed)
Patient very drowsy, will wake when spoken to or touched, answer a question or follow a command, but then falls right back to sleep.  Unable to follow multiple commands without continuing to be woken.  No apparent changes in assessment other than drowsiness.  No pain medications given since 1018 this morning.  Patient does have a history of obstructive sleep apnea.  Discussed this with Dr. Maurice Small and orders for ABGs obtained.  Will monitor for results to discuss with Dr. Maurice Small for further plan.

## 2021-03-11 NOTE — Evaluation (Signed)
Occupational Therapy Evaluation Patient Details Name: Tonya Baker MRN: [id number] DOB: 12/05/1950 Today's Date: 03/11/2021   History of Present Illness 70 yo female presenting with worsening back pain. 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 Recently s/p L1-S1 fusion at another facility. S/p removal of bilateral S1 screws, replacement of screws, and L4-pelvis posterolateral fusion on 12/5. PMH including prior back sx, DM type 2, and sleep apnea.   Clinical Impression   PTA, pt was at SNF, needing assistance for ADLs, and using w/c for mobility; reporting she was able to transfer to her w/c. Pt currently requiring Max A for ADLs and Mod-Max A +2 for functional transfers. Pt presenting with decreased balance, strength, cognition, and activity tolerance. Pt would benefit from further acute OT to facilitate safe dc. Recommend dc to SNF for further OT to optimize safety, independence with ADLs, and return to PLOF.      Recommendations for follow up therapy are one component of a multi-disciplinary discharge planning process, led by the attending physician.  Recommendations may be updated based on patient status, additional functional criteria and insurance authorization.   Follow Up Recommendations  Skilled nursing-short term rehab (<3 hours/day)    Assistance Recommended at Discharge    Functional Status Assessment  Patient has had a recent decline in their functional status and demonstrates the ability to make significant improvements in function in a reasonable and predictable amount of time.  Equipment Recommendations  Other (comment) (Defer to next venue)    Recommendations for Other Services PT consult     Precautions / Restrictions Precautions Precautions: Fall;Back Precaution Booklet Issued: No Precaution Comments: Reviewed spinal precautions Other Brace: No brace per orders      Mobility Bed Mobility Overal bed mobility: Needs Assistance Bed Mobility: Rolling;Sidelying to  Sit;Sit to Sidelying Rolling: Max assist;+2 for physical assistance Sidelying to sit: Max assist;+2 for physical assistance     Sit to sidelying: Total assist;+2 for physical assistance General bed mobility comments: Assistance for log roll and maintaining back precautions. Max-Total A for trunk and BLEs    Transfers Overall transfer level: Needs assistance Equipment used: Rolling walker (2 wheels) Transfers: Sit to/from Stand Sit to Stand: Mod assist;+2 physical assistance;From elevated surface           General transfer comment: Mod A for power up into standing. Requiring BLE knees and feet blocked as her feet with slide.      Balance Overall balance assessment: Needs assistance Sitting-balance support: No upper extremity supported;Feet supported Sitting balance-Leahy Scale: Fair     Standing balance support: Bilateral upper extremity supported;Reliant on assistive device for balance;During functional activity Standing balance-Leahy Scale: Poor Standing balance comment: reliant on physical A                           ADL either performed or assessed with clinical judgement   ADL Overall ADL's : Needs assistance/impaired Eating/Feeding: Set up;Sitting   Grooming: Set up;Sitting   Upper Body Bathing: Maximal assistance;Sitting   Lower Body Bathing: Total assistance;Bed level;Sitting/lateral leans   Upper Body Dressing : Maximal assistance;Sitting   Lower Body Dressing: Total assistance;Sitting/lateral leans;Bed level               Functional mobility during ADLs: Moderate assistance;Maximal assistance;+2 for physical assistance;+2 for safety/equipment;Rolling walker (2 wheels) (sit<>stand and two side steps) General ADL Comments: Max-Total A for bathing and dressing due to pain and limted strength and balance     Vision  Perception     Praxis      Pertinent Vitals/Pain Pain Assessment: Faces Faces Pain Scale: Hurts whole lot Pain  Location: back - increased with movement Pain Descriptors / Indicators: Discomfort;Grimacing Pain Intervention(s): Monitored during session;Limited activity within patient's tolerance;Repositioned     Hand Dominance Right   Extremity/Trunk Assessment Upper Extremity Assessment Upper Extremity Assessment: Generalized weakness   Lower Extremity Assessment Lower Extremity Assessment: Defer to PT evaluation RLE Deficits / Details: ROM: WFL; MMT: ankle 4+/5, knee 3/5  not further tested, hip 1/5 LLE Deficits / Details: ROM: WFL; MMT: ankle 4/5, knee 3/5  not further tested, hip 1/5; note reports chronic L LE weakness   Cervical / Trunk Assessment Cervical / Trunk Assessment: Back Surgery;Other exceptions Cervical / Trunk Exceptions: body habitus   Communication Communication Communication: No difficulties   Cognition Arousal/Alertness: Awake/alert Behavior During Therapy: WFL for tasks assessed/performed Overall Cognitive Status: Impaired/Different from baseline Area of Impairment: Orientation;Memory;Following commands;Safety/judgement;Awareness;Problem solving                 Orientation Level: Disoriented to;Time (stating it is 2022 and october)   Memory: Decreased short-term memory;Decreased recall of precautions Following Commands: Follows one step commands with increased time Safety/Judgement: Decreased awareness of safety;Decreased awareness of deficits Awareness: Emergent Problem Solving: Slow processing;Decreased initiation;Difficulty sequencing;Requires verbal cues;Requires tactile cues General Comments: Requiring max cues for transfer techniques, precautions, and relaxation.  Poor awareness and orientation. becoming anxious with movement and benefits from calming cues     General Comments  Pt on 1 L at arrival with sats 100%; RA during treatment with sats >93% with activity but down to 90-91% at rest; replaced 1 L O2 (pt with hx sleep apnea)    Exercises      Shoulder Instructions      Home Living Family/patient expects to be discharged to:: Skilled nursing facility                                 Additional Comments: [facility] per chart review      Prior Functioning/Environment Prior Level of Function : Needs assist       Physical Assist : Mobility (physical);ADLs (physical) Mobility (physical): Transfers (uses w/c) ADLs (physical): Bathing;Dressing Mobility Comments: Pt non ambulatory and at SNF since prior surgery; working with therapy; reports could tx to w/c on her own but not walk          OT Problem List: Decreased strength;Decreased range of motion;Decreased activity tolerance;Impaired balance (sitting and/or standing);Decreased knowledge of use of DME or AE;Decreased knowledge of precautions      OT Treatment/Interventions: Self-care/ADL training;Therapeutic exercise;Energy conservation;DME and/or AE instruction;Therapeutic activities;Patient/family education    OT Goals(Current goals can be found in the care plan section) Acute Rehab OT Goals Patient Stated Goal: Reduce pain and get better OT Goal Formulation: With patient Time For Goal Achievement: 03/25/21 Potential to Achieve Goals: Good  OT Frequency: Min 2X/week   Barriers to D/C:            Co-evaluation PT/OT/SLP Co-Evaluation/Treatment: Yes Reason for Co-Treatment: For patient/therapist safety;To address functional/ADL transfers PT goals addressed during session: Mobility/safety with mobility OT goals addressed during session: ADL's and self-care      AM-PAC OT "6 Clicks" Daily Activity     Outcome Measure Help from another person eating meals?: A Little Help from another person taking care of personal grooming?: A Little Help from another person toileting, which  includes using toliet, bedpan, or urinal?: Total Help from another person bathing (including washing, rinsing, drying)?: Total Help from another person to put on and  taking off regular upper body clothing?: A Lot Help from another person to put on and taking off regular lower body clothing?: Total 6 Click Score: 11   End of Session Equipment Utilized During Treatment: Rolling walker (2 wheels) Nurse Communication: Mobility status  Activity Tolerance: Patient tolerated treatment well Patient left: in bed;with call bell/phone within reach;with bed alarm set  OT Visit Diagnosis: Unsteadiness on feet (R26.81);Other abnormalities of gait and mobility (R26.89);Muscle weakness (generalized) (M62.81)                Time: 8592-9244 OT Time Calculation (min): 27 min Charges:  OT General Charges $OT Visit: 1 Visit OT Evaluation $OT Eval Moderate Complexity: 1 Mod  Korissa Horsford MSOT, OTR/L Acute Rehab Pager: [phone] Office: [phone]  Theodoro Grist Shadell Brenn 03/11/2021, 12:36 PM

## 2021-03-11 NOTE — Evaluation (Signed)
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  Pt with hx including sleep apnea, DM2, prior discitis s/p L1-S1 fusion.  Clinical Impression  Patient is s/p above surgery resulting in the deficits listed below (see PT Problem List). At baseline, pt has been at SNF since prior sx and non-ambulatory, working with therapy, and able to transfer to w/c on her own.  Today, pt requiring mod-max of 2 for transfers and to stand.  She took a few side shuffle steps, but no true ambulation.  Pt limited by pain and anxiety - requiring max cues for relaxation.  Patient will benefit from skilled PT to increase their independence and safety with mobility (while adhering to their precautions) to allow discharge to the venue listed below.         Recommendations for follow up therapy are one component of a multi-disciplinary discharge planning process, led by the attending physician.  Recommendations may be updated based on patient status, additional functional criteria and insurance authorization.  Follow Up Recommendations Skilled nursing-short term rehab (<3 hours/day)    Assistance Recommended at Discharge Frequent or constant Supervision/Assistance  Functional Status Assessment Patient has had a recent decline in their functional status and demonstrates the ability to make significant improvements in function in a reasonable and predictable amount of time.  Equipment Recommendations  None recommended by PT    Recommendations for Other Services       Precautions / Restrictions Precautions Precautions: Fall;Back Precaution Booklet Issued: No Precaution Comments: Reviewed spinal precautions Other Brace: No brace per orders      Mobility  Bed Mobility Overal bed mobility:  Needs Assistance Bed Mobility: Rolling;Sidelying to Sit;Sit to Sidelying Rolling: Max assist;+2 for physical assistance Sidelying to sit: Max assist;+2 for physical assistance     Sit to sidelying: Total assist;+2 for physical assistance General bed mobility comments: Assistance for log roll and maintaining back precautions. Max-Total A for trunk and BLEs    Transfers Overall transfer level: Needs assistance Equipment used: Rolling walker (2 wheels) Transfers: Sit to/from Stand Sit to Stand: Mod assist;+2 physical assistance;From elevated surface           General transfer comment: Mod A for power up into standing. Requiring BLE knees and feet blocked as her feet with slide.    Ambulation/Gait Ambulation/Gait assistance: Mod assist;+2 physical assistance Gait Distance (Feet): 1 Feet Assistive device: Rolling walker (2 wheels) Gait Pattern/deviations: Shuffle Gait velocity: decreased     General Gait Details: Side shuffled 3 steps toward HOB with RW and feet blocke  Stairs            Wheelchair Mobility    Modified Rankin (Stroke Patients Only)       Balance Overall balance assessment: Needs assistance Sitting-balance support: No upper extremity supported;Feet supported Sitting balance-Leahy Scale: Fair     Standing balance support: Bilateral upper extremity supported;Reliant on assistive device for balance;During functional activity Standing balance-Leahy Scale: Poor Standing balance comment: reliant on physical A                             Pertinent Vitals/Pain Pain Assessment: Faces Faces Pain Scale: Hurts whole lot Pain Location: back - increased with movement Pain Descriptors / Indicators: Discomfort;Grimacing Pain Intervention(s): Limited activity  within patient's tolerance;Monitored during session;Repositioned;Premedicated before session;Relaxation    Home Living Family/patient expects to be discharged to:: Skilled nursing  facility                   Additional Comments: [place]    Prior Function Prior Level of Function : Needs assist       Physical Assist : Mobility (physical);ADLs (physical) Mobility (physical): Transfers (uses w/c) ADLs (physical): Bathing;Dressing Mobility Comments: Pt non ambulatory and at SNF since prior surgery; working with therapy; reports could tx to w/c on her own but not walk       Hand Dominance   Dominant Hand: Right    Extremity/Trunk Assessment   Upper Extremity Assessment Upper Extremity Assessment: Generalized weakness    Lower Extremity Assessment Lower Extremity Assessment: LLE deficits/detail;RLE deficits/detail RLE Deficits / Details: ROM: WFL; MMT: ankle 4+/5, knee 3/5  not further tested, hip 1/5 LLE Deficits / Details: ROM: WFL; MMT: ankle 4/5, knee 3/5  not further tested, hip 1/5; note reports chronic L LE weakness    Cervical / Trunk Assessment Cervical / Trunk Assessment: Back Surgery;Other exceptions Cervical / Trunk Exceptions: body habitus  Communication   Communication: No difficulties  Cognition Arousal/Alertness: Awake/alert Behavior During Therapy: WFL for tasks assessed/performed Overall Cognitive Status: Impaired/Different from baseline Area of Impairment: Orientation;Memory;Following commands;Safety/judgement;Awareness;Problem solving                 Orientation Level: Disoriented to;Time (stating it is 2022 and october)   Memory: Decreased short-term memory;Decreased recall of precautions Following Commands: Follows one step commands with increased time Safety/Judgement: Decreased awareness of safety;Decreased awareness of deficits Awareness: Emergent Problem Solving: Slow processing;Decreased initiation;Difficulty sequencing;Requires verbal cues;Requires tactile cues General Comments: Requiring max cues for transfer techniques, precautions, and relaxation.  At arrival, pt stating she can't roll her bed back and  forth like w/c        General Comments General comments (skin integrity, edema, etc.): Pt on 1 L at arrival with sats 100%; RA during treatment with sats >93% with activity but down to 90-91% at rest; replaced 1 L O2 (pt with hx sleep apnea)    Exercises     Assessment/Plan    PT Assessment Patient needs continued PT services  PT Problem List Decreased strength;Decreased mobility;Decreased safety awareness;Decreased range of motion;Decreased coordination;Decreased knowledge of precautions;Decreased activity tolerance;Decreased cognition;Decreased balance;Decreased knowledge of use of DME;Pain       PT Treatment Interventions DME instruction;Therapeutic activities;Cognitive remediation;Modalities;Gait training;Therapeutic exercise;Patient/family education;Balance training;Functional mobility training;Wheelchair mobility training    PT Goals (Current goals can be found in the Care Plan section)  Acute Rehab PT Goals Patient Stated Goal: return to SNF; decrease pain PT Goal Formulation: With patient Time For Goal Achievement: 03/25/21 Potential to Achieve Goals: Fair    Frequency Min 5X/week   Barriers to discharge        Co-evaluation PT/OT/SLP Co-Evaluation/Treatment: Yes Reason for Co-Treatment: Complexity of the patient's impairments (multi-system involvement);For patient/therapist safety;Other (comment) (pain control) PT goals addressed during session: Mobility/safety with mobility OT goals addressed during session: ADL's and self-care;Strengthening/ROM       AM-PAC PT "6 Clicks" Mobility  Outcome Measure Help needed turning from your back to your side while in a flat bed without using bedrails?: Total Help needed moving from lying on your back to sitting on the side of a flat bed without using bedrails?: Total Help needed moving to and from a bed to a chair (including a wheelchair)?: Total Help needed standing  up from a chair using your arms (e.g., wheelchair or  bedside chair)?: Total Help needed to walk in hospital room?: Total Help needed climbing 3-5 steps with a railing? : Total 6 Click Score: 6    End of Session   Activity Tolerance: Patient limited by pain (but tolerated better than expected) Patient left: in bed;with call bell/phone within reach;with bed alarm set Nurse Communication: Mobility status;Other (comment);Need for lift equipment (pt on bed pan and needs checking in about 5 mins) PT Visit Diagnosis: Other abnormalities of gait and mobility (R26.89);Muscle weakness (generalized) (M62.81)    Time: 3009-2330 PT Time Calculation (min) (ACUTE ONLY): 27 min   Charges:   PT Evaluation $PT Eval Moderate Complexity: 1 Tonya Baker, PT Acute Rehab Services Pager [phone] Tony[facility] [phone]   Tonya Baker 03/11/2021, 12:04 PM

## 2021-03-11 NOTE — Progress Notes (Signed)
   03/11/21 1954  Assess: MEWS Score  Temp 98.8 F (37.1 C)  BP (!) 86/65  Pulse Rate 86  ECG Heart Rate 86  Resp 17  Level of Consciousness Responds to Voice  SpO2 96 %  O2 Device HFNC  O2 Flow Rate (L/min) 6 L/min  Assess: MEWS Score  MEWS Temp 0  MEWS Systolic 1  MEWS Pulse 0  MEWS RR 0  MEWS LOC 1  MEWS Score 2  MEWS Score Color Yellow  Assess: if the MEWS score is Yellow or Red  Were vital signs taken at a resting state? Yes  Focused Assessment No change from prior assessment  Early Detection of Sepsis Score *See Row Information* Low  MEWS guidelines implemented *See Row Information* Yes  Treat  MEWS Interventions Other (Comment)  Pain Score Asleep  Take Vital Signs  Increase Vital Sign Frequency  Yellow: Q 2hr X 2 then Q 4hr X 2, if remains yellow, continue Q 4hrs  Notify: Charge Nurse/RN  Name of Charge Nurse/RN Notified Geannie Risen, RN  Date Charge Nurse/RN Notified 03/11/21  Time Charge Nurse/RN Notified 2148  Document  Patient Outcome Other (Comment)  Progress note created (see row info) Yes

## 2021-03-11 NOTE — Progress Notes (Signed)
Neurosurgery Service Progress Note  Subjective: No acute events overnight, no radicular pain, expected incisional pain, no [place] numbness / weakness   Objective: Vitals:   03/11/21 0000 03/11/21 0345 03/11/21 0750 03/11/21 0753  BP: 102/86 (!) 115/58  (!) 109/57  Pulse: 78 82  87  Resp: 19 15  20   Temp: 97.9 F (36.6 C) 97.8 F (36.6 C)  98.3 F (36.8 C)  TempSrc: Oral Oral  Oral  SpO2: 98% 96% (!) 87% 97%  Weight:      Height:        Physical Exam: Strength 5/5 x4 except baseline RLE distal 4/5 strength, SILTx4 except baseline b/l stocking distribution numbness, incision c/d/I   Assessment & Plan: 70 y.o. woman s/p revision of L5-S1 pseuoarthrosis with revision of hardware and extension to the pelvis, recovering well.  -PT/OT -activity as tolerated -cont current pain regimen -CXR for intra-op IJ CVC placement, will d/c tomorrow if we can get access -SCDs/TEDs, [facility] 12/7  14/7  03/11/21 11:41 AM

## 2021-03-11 NOTE — Progress Notes (Signed)
ABGs completed and normal.  Dr. Maurice Small notified.  Patient condition unchanged but still waking up when name stated.  CT Head ordered.

## 2021-03-12 ENCOUNTER — Encounter (HOSPITAL_COMMUNITY): Payer: Self-pay | Admitting: Neurological Surgery

## 2021-03-12 LAB — GLUCOSE, CAPILLARY
Glucose-Capillary: 145 mg/dL — ABNORMAL HIGH (ref 70–99)
Glucose-Capillary: 162 mg/dL — ABNORMAL HIGH (ref 70–99)
Glucose-Capillary: 131 mg/dL — ABNORMAL HIGH (ref 70–99)
Glucose-Capillary: 150 mg/dL — ABNORMAL HIGH (ref 70–99)

## 2021-03-12 MED ORDER — PNEUMOCOCCAL VAC POLYVALENT 25 MCG/0.5ML IJ INJ
0.5000 mL | INJECTION | INTRAMUSCULAR | Status: AC
  Administered 2021-03-13: 0.5 mL via INTRAMUSCULAR
  Filled 2021-03-12: qty 0.5

## 2021-03-12 MED ORDER — INFLUENZA VAC A&B SA ADJ QUAD 0.5 ML IM PRSY
0.5000 mL | PREFILLED_SYRINGE | INTRAMUSCULAR | Status: AC
  Administered 2021-03-13: 0.5 mL via INTRAMUSCULAR
  Filled 2021-03-12: qty 0.5

## 2021-03-12 MED ORDER — HEPARIN SODIUM (PORCINE) 5000 UNIT/ML IJ SOLN
5000.0000 [IU] | Freq: Three times a day (TID) | INTRAMUSCULAR | Status: DC
  Administered 2021-03-12 – 2021-03-14 (×7): 5000 [IU] via SUBCUTANEOUS
  Filled 2021-03-12 (×7): qty 1

## 2021-03-12 NOTE — Progress Notes (Addendum)
Neurosurgery Service Progress Note  Subjective: Somnolence overnight, improved around 3am without clear cause, she recalls feeling off, feels like she's back to baseline now and appears to be on my exam  Objective: Vitals:   03/12/21 0029 03/12/21 0313 03/12/21 0833 03/12/21 1120  BP: 103/61 (!) 106/55 90/63 107/74  Pulse: 77 77 84 76  Resp: 13 12 16 18   Temp: 98.5 F (36.9 C) 98.4 F (36.9 C) 98.4 F (36.9 C) 98.6 F (37 C)  TempSrc: Oral Oral Oral Oral  SpO2: 97% 98% 92% 97%  Weight:      Height:        Physical Exam: Aox3, PERRL, EOMI, FS & SS Strength 5/5 x4 except baseline RLE distal 4/5 strength, SILTx4 except baseline b/l stocking distribution numbness, incision c/d/I   Assessment & Plan: 70 y.o. woman s/p revision of L5-S1 pseuoarthrosis with revision of hardware and extension to the pelvis, post-op encephalopathy w/ mild hypoxia on ABG that resolved, CTH neg, CXR no pneumo  -PT/OT rec SNF, can start discharge planning, potentially can be discharged tomorrow or 12/9 if her mental status is stable -activity as tolerated -cont current pain regimen -will see if we can get peripherals so we can d/c her IJ CVC -SCDs/TEDs, [facility] today -CDI query answer - pt has diabetes melltius 2 with hyperglycemia; lasix is for lower extremity fluid retention  14/9  03/12/21 3:08 PM

## 2021-03-12 NOTE — Plan of Care (Signed)

## 2021-03-13 LAB — RESP PANEL BY RT-PCR (FLU A&B, COVID) ARPGX2
Influenza A by PCR: NEGATIVE
Influenza B by PCR: NEGATIVE
SARS Coronavirus 2 by RT PCR: NEGATIVE

## 2021-03-13 LAB — GLUCOSE, CAPILLARY
Glucose-Capillary: 236 mg/dL — ABNORMAL HIGH (ref 70–99)
Glucose-Capillary: 230 mg/dL — ABNORMAL HIGH (ref 70–99)
Glucose-Capillary: 101 mg/dL — ABNORMAL HIGH (ref 70–99)
Glucose-Capillary: 191 mg/dL — ABNORMAL HIGH (ref 70–99)

## 2021-03-13 NOTE — Progress Notes (Signed)
Pt R IJ CVC removed, tolerated well, tip intact upon removal, no signs of infection, pressure held for 5 minutes, minimal bleeding from site, pt tolerated well, VS stable. Will continue to monitor site for any bleeding.

## 2021-03-13 NOTE — Progress Notes (Addendum)
Patient thinks her thoughts are fuzzy this morning and claims that it is not normal for her. Patient could not say the words "Redge Gainer" and also is having trouble communicating. Word finding is different than w\hen PT/OT assessed her on Tuesday. Patient assessed, left side seems weaker than right side.let gaze is a little choppy compared to right gaze Edge of Bed BP was 126/89. Laying is 138/82. O2 saturation was 98 on Room Air.   Mikki Harbor, RN

## 2021-03-13 NOTE — Progress Notes (Addendum)
Occupational Therapy Treatment Patient Details Name: Tonya Baker MRN: [id number] DOB: 03/15/1951 Today's Date: 03/13/2021   History of present illness 70 yo female presenting with worsening back pain. 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 Recently s/p L1-S1 fusion at another facility. S/p removal of bilateral S1 screws, replacement of screws, and L4-pelvis posterolateral fusion on 12/5. PMH including prior back sx, DM type 2, and sleep apnea.   OT comments  Pt supine and awake upon arrival. Pt familiar to this therapist (from last session on 12/6) and pt presenting with decreased word finding, balance, and arousal compared to prior session. RN present during session. Pt performing log roll to EOB with Total A +2 and requiring Max A +2 for sitting at EOB. Continue to recommend dc to SNF and will continue to follow acutely.   SpO2 98% on RA. HR 90s. BP 121/74 (87) supine, 126/89 (102) sitting, and 138/82 (99) supine   Recommendations for follow up therapy are one component of a multi-disciplinary discharge planning process, led by the attending physician.  Recommendations may be updated based on patient status, additional functional criteria and insurance authorization.    Follow Up Recommendations  Skilled nursing-short term rehab (<3 hours/day)    Assistance Recommended at Discharge    Equipment Recommendations  Other (comment) (Defer to next venue)    Recommendations for Other Services PT consult    Precautions / Restrictions Precautions Precautions: Fall;Back Precaution Booklet Issued: No Precaution Comments: Reviewed spinal precautions Other Brace: No brace per orders       Mobility Bed Mobility Overal bed mobility: Needs Assistance Bed Mobility: Rolling;Sidelying to Sit;Sit to Sidelying Rolling: Total assist;+2 for physical assistance Sidelying to sit: Total assist;+2 for physical assistance     Sit to sidelying: Total assist;+2 for physical assistance General bed  mobility comments: Assistance for log roll and maintaining back precautions. Total A for trunk and BLEs    Transfers                   General transfer comment: Defer due to decreased safety     Balance Overall balance assessment: Needs assistance Sitting-balance support: No upper extremity supported;Feet supported Sitting balance-Leahy Scale: Poor Sitting balance - Comments: posterior lean - significant compared to prior session when pt was abel to maintain static sitting                                   ADL either performed or assessed with clinical judgement   ADL Overall ADL's : Needs assistance/impaired                     Lower Body Dressing: Total assistance Lower Body Dressing Details (indicate cue type and reason): Reposition socks               General ADL Comments: Max A +2 for bed mobility and sitting at EOB.    Extremity/Trunk Assessment Upper Extremity Assessment Upper Extremity Assessment: LUE deficits/detail LUE Deficits / Details: weak grasp bu equal to R. Decreased coorindation compared to RUE and fatigues quickly during finger to nose test LUE Coordination: decreased gross motor;decreased fine motor   Lower Extremity Assessment Lower Extremity Assessment: Defer to PT evaluation        Vision   Vision Assessment?: Yes Tracking/Visual Pursuits: Decreased smoothness of eye movement to LEFT superior field;Decreased smoothness of eye movement to LEFT inferior field Additional Comments: Decreased tracking to L  and quikly fatigues. Baseline cataract at L eye   Perception     Praxis      Cognition Arousal/Alertness: Awake/alert Behavior During Therapy: WFL for tasks assessed/performed Overall Cognitive Status: Impaired/Different from baseline Area of Impairment: Orientation;Memory;Following commands;Safety/judgement;Awareness;Problem solving                 Orientation Level: Disoriented to;Time (stating it is  2022 and october)   Memory: Decreased short-term memory;Decreased recall of precautions Following Commands: Follows one step commands with increased time Safety/Judgement: Decreased awareness of safety;Decreased awareness of deficits Awareness: Emergent Problem Solving: Slow processing;Decreased initiation;Difficulty sequencing;Requires verbal cues;Requires tactile cues General Comments: Pt this session with decreased word finding and arousal compared to last session. Pt closing her eyes periodically during session. Pt unabel to answer orientation questions nad repeating her name 5x when asked by RN. pt unable to provide her date of birth or reasons for hospital admission. Asking pt to repeat after therapist and she was abel to repeat "[place]" and unable to repeat "[facility]" or "John brown". also possible inattention to left during strength testing          Exercises     Shoulder Instructions       General Comments SpO2 98% on RA. HR 90s. BP 121/74 (87) supine, 126/89 (102) sitting, and 138/82 (99) supine    Pertinent Vitals/ Pain       Pain Assessment: Faces Faces Pain Scale: Hurts whole lot Pain Location: back - increased with movement Pain Descriptors / Indicators: Discomfort;Grimacing Pain Intervention(s): Monitored during session;Limited activity within patient's tolerance;Repositioned  Home Living                                          Prior Functioning/Environment              Frequency  Min 2X/week        Progress Toward Goals  OT Goals(current goals can now be found in the care plan section)  Progress towards OT goals: Not progressing toward goals - comment  Acute Rehab OT Goals OT Goal Formulation: With patient Time For Goal Achievement: 03/25/21 Potential to Achieve Goals: Good ADL Goals Pt Will Perform Upper Body Dressing: with min guard assist;sitting Pt Will Perform Lower Body Dressing: with mod assist;sit  to/from stand;with adaptive equipment Pt Will Transfer to Toilet: with mod assist;stand pivot transfer;bedside commode Additional ADL Goal #1: Pt will perform bed mobility with Min A in preparation for ADLs  Plan Discharge plan remains appropriate    Co-evaluation    PT/OT/SLP Co-Evaluation/Treatment: Yes Reason for Co-Treatment: For patient/therapist safety;To address functional/ADL transfers   OT goals addressed during session: ADL's and self-care      AM-PAC OT "6 Clicks" Daily Activity     Outcome Measure   Help from another person eating meals?: A Little Help from another person taking care of personal grooming?: A Little Help from another person toileting, which includes using toliet, bedpan, or urinal?: Total Help from another person bathing (including washing, rinsing, drying)?: Total Help from another person to put on and taking off regular upper body clothing?: A Lot Help from another person to put on and taking off regular lower body clothing?: Total 6 Click Score: 11    End of Session    OT Visit Diagnosis: Unsteadiness on feet (R26.81);Other abnormalities of gait and mobility (R26.89);Muscle weakness (generalized) (M62.81)  Activity Tolerance Patient tolerated treatment well   Patient Left in bed;with call bell/phone within reach;with bed alarm set   Nurse Communication Mobility status        Time: 2836-6294 OT Time Calculation (min): 23 min  Charges: OT General Charges $OT Visit: 1 Visit OT Treatments $Therapeutic Activity: 8-22 mins  Kairon Shock MSOT, OTR/L Acute Rehab Pager: [phone] Office: [phone]  Theodoro Grist Owais Pruett 03/13/2021, 8:28 AM

## 2021-03-13 NOTE — TOC Initial Note (Signed)
Transition of Care [facility]) - Initial/Assessment Note    Patient Details  Name: Tonya Baker MRN: [id number] Date of Birth: 1950/04/10  Transition of [facility] CM/SW Contact:    Eduard Roux, LCSW Phone Number: 03/13/2021, 2:42 PM  Clinical Narrative:                  CSW spoke with patient's niece,[name] she confirmed the patient is from [facility] and she is expected to return when stable.   CSW informed [facility] -anticipate discharge tomorrow   RN updated and covid test requested  Antony Blackbird, MSW, LCSW Clinical Social Worker    Expected Discharge Plan: Skilled Nursing Facility     Patient Goals and CMS Choice        Expected Discharge Plan and Services Expected Discharge Plan: Skilled Nursing Facility In-house Referral: Clinical Social Work                                            Prior Living Arrangements/Services   Lives with:: Self Patient language and need for interpreter reviewed:: No        Need for Family Participation in Patient Care: Yes (Comment) Care giver support system in place?: Yes (comment)      Activities of Daily Living Home Assistive Devices/Equipment: Wheelchair, CBG Meter ADL Screening (condition at time of admission) Patient's cognitive ability adequate to safely complete daily activities?: Yes Is the patient deaf or have difficulty hearing?: No Does the patient have difficulty seeing, even when wearing glasses/contacts?: No Does the patient have difficulty concentrating, remembering, or making decisions?: No Patient able to express need for assistance with ADLs?: Yes Does the patient have difficulty dressing or bathing?: No Independently performs ADLs?: Yes (appropriate for developmental age) Does the patient have difficulty walking or climbing stairs?: Yes Weakness of Legs: Both Weakness of Arms/Hands: Both  Permission Sought/Granted                  Emotional Assessment    Attitude/Demeanor/Rapport: Unable to Assess Affect (typically observed): Unable to Assess Orientation: : Oriented to Self, Oriented to Place Alcohol / Substance Use: Not Applicable Psych Involvement: No (comment)  Admission diagnosis:  Pseudoarthrosis of lumbar spine [S32.009K] Patient Active Problem List   Diagnosis Date Noted   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   Bacteremia due to Streptococcus    Volume overload 12/29/2020   Hardware complicating wound infection (HCC)    Vertebral osteomyelitis (HCC)    Back pain 12/28/2020   Hypotension 12/28/2020   CKD (chronic kidney disease) stage 3, GFR 30-59 ml/min (HCC) 12/28/2020   Spinal stenosis 12/28/2020   Obstructive sleep apnea 12/28/2020   Sepsis (HCC) 12/28/2020   Type 2 diabetes mellitus with diabetic neuropathy, with long-term current use of insulin (HCC) 12/28/2020   PCP:  Eloisa Northern, MD Pharmacy:  No Pharmacies Listed    Social Determinants of Health (SDOH) Interventions    Readmission Risk Interventions No flowsheet data found.

## 2021-03-13 NOTE — Progress Notes (Signed)
Physical Therapy Treatment Patient Details Name: Tonya Baker MRN: [id number] DOB: Mar 15, 1951 Today's Date: 03/13/2021   History of Present Illness 70 yo female presenting with worsening back pain. 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 Recently s/p L1-S1 fusion at another facility. S/p removal of bilateral S1 screws, replacement of screws, and L4-pelvis posterolateral fusion on 12/5. PMH including prior back sx, DM type 2, and sleep apnea.    PT Comments    Pt with increased word finding difficulty today and requires repeated cuing for mobility and cognitive commands. Per chart review, this is different vs pt baseline. Pt requiring total assist +2 for mobility at this time, also worse than eval. LLE presenting weaker than RLE at present, RN present and PT/OT requesting MD evaluation given acute change. PT to continue to follow.    Recommendations for follow up therapy are one component of a multi-disciplinary discharge planning process, led by the attending physician.  Recommendations may be updated based on patient status, additional functional criteria and insurance authorization.  Follow Up Recommendations  Skilled nursing-short term rehab (<3 hours/day)     Assistance Recommended at Discharge Frequent or constant Supervision/Assistance  Equipment Recommendations  None recommended by PT    Recommendations for Other Services       Precautions / Restrictions Precautions Precautions: Fall;Back Precaution Booklet Issued: No Precaution Comments: Reviewed spinal precautions Other Brace: No brace per orders     Mobility  Bed Mobility Overal bed mobility: Needs Assistance Bed Mobility: Rolling;Sidelying to Sit;Sit to Sidelying Rolling: Total assist;+2 for physical assistance Sidelying to sit: Total assist;+2 for physical assistance     Sit to sidelying: Total assist;+2 for physical assistance General bed mobility comments: Assistance for log roll and maintaining back precautions.  Total A for trunk and BLEs, pt assisting very little and resistant to movement at times requiring max encouragement to participate    Transfers                   General transfer comment: Defer due to decreased safety    Ambulation/Gait                   Stairs             Wheelchair Mobility    Modified Rankin (Stroke Patients Only)       Balance Overall balance assessment: Needs assistance Sitting-balance support: No upper extremity supported;Feet supported Sitting balance-Leahy Scale: Poor Sitting balance - Comments: posterior lean - significant compared to prior session when pt was abel to maintain static sitting                                    Cognition Arousal/Alertness: Awake/alert Behavior During Therapy: WFL for tasks assessed/performed Overall Cognitive Status: Impaired/Different from baseline Area of Impairment: Orientation;Memory;Following commands;Safety/judgement;Awareness;Problem solving                 Orientation Level: Disoriented to;Time (stating it is 2022 and october)   Memory: Decreased short-term memory;Decreased recall of precautions Following Commands: Follows one step commands with increased time Safety/Judgement: Decreased awareness of safety;Decreased awareness of deficits Awareness: Emergent Problem Solving: Slow processing;Decreased initiation;Difficulty sequencing;Requires verbal cues;Requires tactile cues General Comments: Pt this session with decreased word finding and arousal compared to last session. Pt closing her eyes periodically during session. Pt unabel to answer orientation questions nad repeating her name 5x when asked by RN. pt unable to provide her date  of birth or reasons for hospital admission. Asking pt to repeat after therapist and she was abel to repeat "christmas tree" and unable to repeat "[facility]" or "John brown". also possible inattention to left during strength  testing        Exercises      General Comments General comments (skin integrity, edema, etc.): SpO2 98% on RA, BP supine 121/74, BP sitting 126/89, BP return to supine 138/92      Pertinent Vitals/Pain Pain Assessment: 0-10 Pain Score: 10-Worst pain ever Faces Pain Scale: Hurts whole lot Pain Location: back - increased with movement Pain Descriptors / Indicators: Discomfort;Grimacing Pain Intervention(s): Limited activity within patient's tolerance;Repositioned;Monitored during session;Relaxation    Home Living                          Prior Function            PT Goals (current goals can now be found in the care plan section) Acute Rehab PT Goals Patient Stated Goal: return to SNF; decrease pain PT Goal Formulation: With patient Time For Goal Achievement: 03/25/21 Potential to Achieve Goals: Fair Progress towards PT goals: Progressing toward goals    Frequency    Min 5X/week      PT Plan Current plan remains appropriate    Co-evaluation PT/OT/SLP Co-Evaluation/Treatment: Yes Reason for Co-Treatment: For patient/therapist safety;Complexity of the patient's impairments (multi-system involvement);To address functional/ADL transfers PT goals addressed during session: Mobility/safety with mobility;Balance OT goals addressed during session: ADL's and self-care      AM-PAC PT "6 Clicks" Mobility   Outcome Measure  Help needed turning from your back to your side while in a flat bed without using bedrails?: Total Help needed moving from lying on your back to sitting on the side of a flat bed without using bedrails?: Total Help needed moving to and from a bed to a chair (including a wheelchair)?: Total Help needed standing up from a chair using your arms (e.g., wheelchair or bedside chair)?: Total Help needed to walk in hospital room?: Total Help needed climbing 3-5 steps with a railing? : Total 6 Click Score: 6    End of Session   Activity Tolerance:  Patient limited by pain;Treatment limited secondary to medical complications (Comment) (word finding difficulty, confusion vs baseline) Patient left: in bed;with call bell/phone within reach;with bed alarm set;with nursing/sitter in room Nurse Communication: Mobility status;Other (comment) (aphasic-type changes) PT Visit Diagnosis: Other abnormalities of gait and mobility (R26.89);Muscle weakness (generalized) (M62.81)     Time: 9381-0175 PT Time Calculation (min) (ACUTE ONLY): 23 min  Charges:  $Therapeutic Activity: 8-22 mins                     Marye Round, PT DPT Acute Rehabilitation Services Pager [phone]  Office [phone]    Tyrone Apple E Christain [place] 03/13/2021, 8:36 AM

## 2021-03-13 NOTE — Progress Notes (Signed)
Neurosurgery Service Progress Note  Subjective: NAE ON, no somnolence / improved today, felt a little fuzzy this morning but felt normal on rounds this morning  Objective: Vitals:   03/12/21 2020 03/12/21 2316 03/13/21 0322 03/13/21 0800  BP: 115/60 100/76 95/63 126/89  Pulse: 83 80 81 95  Resp: 17 11 14 18   Temp: 98.8 F (37.1 C) 99 F (37.2 C) 98.4 F (36.9 C) 98.2 F (36.8 C)  TempSrc: Oral Oral Oral Oral  SpO2: 99% 100% 95% 98%  Weight:      Height:        Physical Exam: Aox3, PERRL, EOMI, FS & SS Strength 5/5 x4 except baseline RLE distal 4/5 strength, SILTx4 except baseline b/l stocking distribution numbness, incision c/d/I   Assessment & Plan: 70 y.o. woman s/p revision of L5-S1 pseuoarthrosis with revision of hardware and extension to the pelvis, post-op encephalopathy w/ mild hypoxia on ABG that resolved, CTH neg, CXR no pneumo, 12/7 IJ CVC d/c'd  -okay to return to SNF when able -activity as tolerated -cont current pain regimen -SCDs/TEDs, SQH   14/7  03/13/21 9:28 AM

## 2021-03-14 LAB — GLUCOSE, CAPILLARY
Glucose-Capillary: 174 mg/dL — ABNORMAL HIGH (ref 70–99)
Glucose-Capillary: 174 mg/dL — ABNORMAL HIGH (ref 70–99)

## 2021-03-14 NOTE — Progress Notes (Signed)
Neurosurgery Service Progress Note  Subjective: NAE ON, had some urinary retention overnight otherwise no issues  Objective: Vitals:   03/13/21 2020 03/13/21 2311 03/14/21 0343 03/14/21 0758  BP: 98/64 102/60 (!) 105/56 (!) 105/59  Pulse:  66 72 75  Resp: 14  13 15   Temp: 98.4 F (36.9 C) 98.5 F (36.9 C) 98.4 F (36.9 C) 97.8 F (36.6 C)  TempSrc: Oral Oral Oral Oral  SpO2:  93% 93% 97%  Weight:      Height:        Physical Exam: Aox3, PERRL, EOMI, FS & SS Strength 5/5 x4 except baseline RLE distal 4/5 strength, SILTx4 except baseline b/l stocking distribution numbness, incision c/d/I   Assessment & Plan: 70 y.o. woman s/p revision of L5-S1 pseuoarthrosis with revision of hardware and extension to the pelvis, post-op encephalopathy w/ mild hypoxia on ABG that resolved, CTH neg, CXR no pneumo, 12/7 IJ CVC d/c'd  -okay to return to SNF today if bed available -activity as tolerated -cont current pain regimen -SCDs/TEDs, SQH   14/7 Judythe Postema  03/14/21 9:15 AM

## 2021-03-14 NOTE — Discharge Summary (Signed)
Discharge Summary  Date of Admission: 03/10/2021  Date of Discharge: 03/14/21  Attending Physician: Autumn Patty, MD  Hospital Course: Patient was admitted following an uncomplicated revision of an L5-S1 pseudoarthrosis with extension to the pelvis on 03/10/21. She was recovered in PACU and transferred to 4[facility]. Her back pain was improved post-op, her hospital course was notable for some post-op encephalopathy that resolved. Workup included a negative CT head and an ABG with mild hypoxia that resolved with supplementation. Her hospitalization was otherwise uncomplicated and the patient was discharged back to her SNF on 03/14/21. She will follow up in clinic with me in 2 weeks.  Neurologic exam at discharge:  Aox3, PERRL, EOMI, FS & SS Strength 5/5 x4 except baseline RLE distal 4/5 strength, SILTx4 except baseline b/l stocking distribution numbness, incision c/d/I   Discharge diagnosis: Lumbar pseudoarthrosis  [name], MD 03/14/21 9:17 AM

## 2021-03-14 NOTE — Progress Notes (Signed)
Called [facility] three times and the last time I called that nurse taking report was not going to be her nurse. I also explained that [facility] could not take her wheelchair and that facility would have to come back and get it. She said that they would give her a Luckett wheel chair but I told her that she has bags located on the chair that belong to her. I asked to tell [facility] to come back and get it if possible. I told her that she is A and O times 2 for me. She wears oxygent at night. We placed a Cassatt foley in today because of urinary retention. Her back has skin glue and she as an IV in her right forearm. Patient is weak in all extremities

## 2021-03-14 NOTE — Progress Notes (Signed)
Called doctor who visited today and left message with Diplomatic Services operational officer. We would like to put in another foley for her. She is being discharged to a nursing home today.

## 2021-03-14 NOTE — Progress Notes (Signed)
1135 pm:  RN bladder scan: 835 mL .. in/out: 825 mL .Marland Kitchen post void residual: 0 mL

## 2021-03-14 NOTE — Progress Notes (Signed)
0935 am RN bladder scan 525 mL. In/out cath and got out 600 mL. 0 mL post void.

## 2021-03-14 NOTE — Plan of Care (Signed)
Patient is being discharged to SNF. 

## 2021-03-14 NOTE — TOC Transition Note (Signed)
Transition of Care [facility]) - CM/SW Discharge Note   Patient Details  Name: Tonya Baker MRN: [id number] Date of Birth: 26-Mar-1951  [facility] CM/SW Contact:  Eduard Roux, LCSW Phone Number: 03/14/2021, 12:51 PM   Clinical Narrative:     Patient will Discharge to: [facility] Discharge Date:03/14/2021 Family Notified: [name] Transport By: Sharin Mons   Per MD patient is ready for discharge. RN, patient, and facility notified of discharge. Discharge Summary sent to facility. RN given number for [phone], Room 223-B. Ambulance transport requested for patient.   Clinical Social Worker signing off.  Antony Blackbird, MSW, LCSW Clinical Social Worker    Final next level of care: Skilled Nursing Facility Barriers to Discharge: Barriers Resolved   Patient Goals and CMS Choice        Discharge Placement              Patient chooses bed at: [facility] Patient to be transferred to facility by: [facility] Name of family member notified: niece,[name] Patient and family notified of of transfer: 03/14/21  Discharge Plan and Services In-house Referral: Clinical Social Work                                   Social Determinants of Health (SDOH) Interventions     Readmission Risk Interventions No flowsheet data found.

## 2021-04-04 ENCOUNTER — Other Ambulatory Visit: Payer: Self-pay

## 2021-04-04 ENCOUNTER — Emergency Department (HOSPITAL_COMMUNITY): Payer: Medicare Other

## 2021-04-04 ENCOUNTER — Inpatient Hospital Stay (HOSPITAL_COMMUNITY): Payer: Medicare Other

## 2021-04-04 ENCOUNTER — Inpatient Hospital Stay (HOSPITAL_COMMUNITY)
Admission: EM | Admit: 2021-04-04 | Discharge: 2021-04-12 | DRG: 862 | Disposition: A | Discharge status: Skilled Nursing Facility | Payer: Medicare Other | Source: Skilled Nursing Facility | Attending: Internal Medicine | Admitting: Internal Medicine

## 2021-04-04 ENCOUNTER — Encounter (HOSPITAL_COMMUNITY): Payer: Self-pay

## 2021-04-04 DIAGNOSIS — R471 Dysarthria and anarthria: Secondary | ICD-10-CM | POA: Diagnosis present

## 2021-04-04 DIAGNOSIS — L0291 Cutaneous abscess, unspecified: Secondary | ICD-10-CM

## 2021-04-04 DIAGNOSIS — G9341 Metabolic encephalopathy: Secondary | ICD-10-CM | POA: Diagnosis present

## 2021-04-04 DIAGNOSIS — I129 Hypertensive chronic kidney disease with stage 1 through stage 4 chronic kidney disease, or unspecified chronic kidney disease: Secondary | ICD-10-CM | POA: Diagnosis present

## 2021-04-04 DIAGNOSIS — Z20822 Contact with and (suspected) exposure to covid-19: Secondary | ICD-10-CM | POA: Diagnosis present

## 2021-04-04 DIAGNOSIS — T8144XA Sepsis following a procedure, initial encounter: Secondary | ICD-10-CM | POA: Diagnosis present

## 2021-04-04 DIAGNOSIS — I951 Orthostatic hypotension: Secondary | ICD-10-CM | POA: Diagnosis present

## 2021-04-04 DIAGNOSIS — T8141XA Infection following a procedure, superficial incisional surgical site, initial encounter: Principal | ICD-10-CM | POA: Diagnosis present

## 2021-04-04 DIAGNOSIS — G4733 Obstructive sleep apnea (adult) (pediatric): Secondary | ICD-10-CM | POA: Diagnosis present

## 2021-04-04 DIAGNOSIS — E1122 Type 2 diabetes mellitus with diabetic chronic kidney disease: Secondary | ICD-10-CM | POA: Diagnosis present

## 2021-04-04 DIAGNOSIS — E876 Hypokalemia: Secondary | ICD-10-CM | POA: Diagnosis not present

## 2021-04-04 DIAGNOSIS — E669 Obesity, unspecified: Secondary | ICD-10-CM | POA: Diagnosis present

## 2021-04-04 DIAGNOSIS — E785 Hyperlipidemia, unspecified: Secondary | ICD-10-CM | POA: Diagnosis present

## 2021-04-04 DIAGNOSIS — R509 Fever, unspecified: Secondary | ICD-10-CM | POA: Diagnosis present

## 2021-04-04 DIAGNOSIS — A419 Sepsis, unspecified organism: Secondary | ICD-10-CM

## 2021-04-04 DIAGNOSIS — F40240 Claustrophobia: Secondary | ICD-10-CM | POA: Diagnosis present

## 2021-04-04 DIAGNOSIS — Z87891 Personal history of nicotine dependence: Secondary | ICD-10-CM

## 2021-04-04 DIAGNOSIS — E119 Type 2 diabetes mellitus without complications: Secondary | ICD-10-CM | POA: Diagnosis not present

## 2021-04-04 DIAGNOSIS — L02212 Cutaneous abscess of back [any part, except buttock]: Secondary | ICD-10-CM | POA: Insufficient documentation

## 2021-04-04 DIAGNOSIS — Z66 Do not resuscitate: Secondary | ICD-10-CM | POA: Diagnosis not present

## 2021-04-04 DIAGNOSIS — R6520 Severe sepsis without septic shock: Secondary | ICD-10-CM | POA: Diagnosis present

## 2021-04-04 DIAGNOSIS — Z794 Long term (current) use of insulin: Secondary | ICD-10-CM

## 2021-04-04 DIAGNOSIS — G061 Intraspinal abscess and granuloma: Secondary | ICD-10-CM | POA: Diagnosis not present

## 2021-04-04 DIAGNOSIS — L7634 Postprocedural seroma of skin and subcutaneous tissue following other procedure: Secondary | ICD-10-CM | POA: Diagnosis present

## 2021-04-04 DIAGNOSIS — Z7982 Long term (current) use of aspirin: Secondary | ICD-10-CM

## 2021-04-04 DIAGNOSIS — F419 Anxiety disorder, unspecified: Secondary | ICD-10-CM | POA: Diagnosis present

## 2021-04-04 DIAGNOSIS — Z79899 Other long term (current) drug therapy: Secondary | ICD-10-CM

## 2021-04-04 DIAGNOSIS — F32A Depression, unspecified: Secondary | ICD-10-CM | POA: Diagnosis present

## 2021-04-04 DIAGNOSIS — E1140 Type 2 diabetes mellitus with diabetic neuropathy, unspecified: Secondary | ICD-10-CM

## 2021-04-04 DIAGNOSIS — Y838 Other surgical procedures as the cause of abnormal reaction of the patient, or of later complication, without mention of misadventure at the time of the procedure: Secondary | ICD-10-CM | POA: Diagnosis present

## 2021-04-04 DIAGNOSIS — N183 Chronic kidney disease, stage 3 unspecified: Secondary | ICD-10-CM | POA: Diagnosis present

## 2021-04-04 DIAGNOSIS — G9340 Encephalopathy, unspecified: Secondary | ICD-10-CM | POA: Diagnosis not present

## 2021-04-04 DIAGNOSIS — Z79891 Long term (current) use of opiate analgesic: Secondary | ICD-10-CM

## 2021-04-04 DIAGNOSIS — Z888 Allergy status to other drugs, medicaments and biological substances status: Secondary | ICD-10-CM

## 2021-04-04 DIAGNOSIS — Z91040 Latex allergy status: Secondary | ICD-10-CM

## 2021-04-04 DIAGNOSIS — Z792 Long term (current) use of antibiotics: Secondary | ICD-10-CM

## 2021-04-04 DIAGNOSIS — D6959 Other secondary thrombocytopenia: Secondary | ICD-10-CM | POA: Diagnosis present

## 2021-04-04 DIAGNOSIS — E1143 Type 2 diabetes mellitus with diabetic autonomic (poly)neuropathy: Secondary | ICD-10-CM | POA: Diagnosis present

## 2021-04-04 DIAGNOSIS — Z8614 Personal history of Methicillin resistant Staphylococcus aureus infection: Secondary | ICD-10-CM

## 2021-04-04 DIAGNOSIS — N1831 Chronic kidney disease, stage 3a: Secondary | ICD-10-CM | POA: Diagnosis not present

## 2021-04-04 DIAGNOSIS — Z6832 Body mass index (BMI) 32.0-32.9, adult: Secondary | ICD-10-CM

## 2021-04-04 LAB — CBC WITH DIFFERENTIAL/PLATELET
Abs Immature Granulocytes: 0.06 10*3/uL (ref 0.00–0.07)
Basophils Absolute: 0.1 10*3/uL (ref 0.0–0.1)
Basophils Relative: 0 %
Eosinophils Absolute: 0 10*3/uL (ref 0.0–0.5)
Eosinophils Relative: 0 %
HCT: 35.4 % — ABNORMAL LOW (ref 36.0–46.0)
Hemoglobin: 11.1 g/dL — ABNORMAL LOW (ref 12.0–15.0)
Immature Granulocytes: 1 %
Lymphocytes Relative: 8 %
Lymphs Abs: 0.9 10*3/uL (ref 0.7–4.0)
MCH: 27.8 pg (ref 26.0–34.0)
MCHC: 31.4 g/dL (ref 30.0–36.0)
MCV: 88.5 fL (ref 80.0–100.0)
Monocytes Absolute: 0.6 10*3/uL (ref 0.1–1.0)
Monocytes Relative: 5 %
Neutro Abs: 9.6 10*3/uL — ABNORMAL HIGH (ref 1.7–7.7)
Neutrophils Relative %: 86 %
Platelets: 161 10*3/uL (ref 150–400)
RBC: 4 MIL/uL (ref 3.87–5.11)
RDW: 15.2 % (ref 11.5–15.5)
WBC: 11.2 10*3/uL — ABNORMAL HIGH (ref 4.0–10.5)
nRBC: 0 % (ref 0.0–0.2)

## 2021-04-04 LAB — AMMONIA: Ammonia: <10 umol/L (ref 9–35)

## 2021-04-04 LAB — I-STAT VENOUS BLOOD GAS, ED
Acid-Base Excess: 3 mmol/L — ABNORMAL HIGH (ref 0.0–2.0)
Bicarbonate: 28.8 mmol/L — ABNORMAL HIGH (ref 20.0–28.0)
Calcium, Ion: 1.12 mmol/L — ABNORMAL LOW (ref 1.15–1.40)
HCT: 32 % — ABNORMAL LOW (ref 36.0–46.0)
Hemoglobin: 10.9 g/dL — ABNORMAL LOW (ref 12.0–15.0)
O2 Saturation: 99 %
Potassium: 3.6 mmol/L (ref 3.5–5.1)
Sodium: 138 mmol/L (ref 135–145)
TCO2: 30 mmol/L (ref 22–32)
pCO2, Ven: 51.1 mmHg (ref 44.0–60.0)
pH, Ven: 7.358 (ref 7.250–7.430)
pO2, Ven: 170 mmHg — ABNORMAL HIGH (ref 32.0–45.0)

## 2021-04-04 LAB — COMPREHENSIVE METABOLIC PANEL
ALT: 11 U/L (ref 0–44)
AST: 21 U/L (ref 15–41)
Albumin: 2.8 g/dL — ABNORMAL LOW (ref 3.5–5.0)
Alkaline Phosphatase: 185 U/L — ABNORMAL HIGH (ref 38–126)
Anion gap: 9 (ref 5–15)
BUN: 27 mg/dL — ABNORMAL HIGH (ref 8–23)
CO2: 25 mmol/L (ref 22–32)
Calcium: 8.7 mg/dL — ABNORMAL LOW (ref 8.9–10.3)
Chloride: 102 mmol/L (ref 98–111)
Creatinine, Ser: 1.46 mg/dL — ABNORMAL HIGH (ref 0.44–1.00)
GFR, Estimated: 39 mL/min — ABNORMAL LOW (ref >60)
Glucose, Bld: 159 mg/dL — ABNORMAL HIGH (ref 70–99)
Potassium: 3.6 mmol/L (ref 3.5–5.1)
Sodium: 136 mmol/L (ref 135–145)
Total Bilirubin: 0.7 mg/dL (ref 0.3–1.2)
Total Protein: 6.9 g/dL (ref 6.5–8.1)

## 2021-04-04 LAB — CORTISOL: Cortisol, Plasma: 13.4 ug/dL

## 2021-04-04 LAB — LACTIC ACID, PLASMA
Lactic Acid, Venous: 2 mmol/L (ref 0.5–1.9)
Lactic Acid, Venous: 1.8 mmol/L (ref 0.5–1.9)

## 2021-04-04 LAB — RESP PANEL BY RT-PCR (FLU A&B, COVID) ARPGX2
Influenza A by PCR: NEGATIVE
Influenza B by PCR: NEGATIVE
SARS Coronavirus 2 by RT PCR: NEGATIVE

## 2021-04-04 LAB — TSH: TSH: 0.884 u[IU]/mL (ref 0.350–4.500)

## 2021-04-04 LAB — APTT: aPTT: 32 seconds (ref 24–36)

## 2021-04-04 LAB — PROTIME-INR
INR: 1.1 (ref 0.8–1.2)
Prothrombin Time: 14.6 seconds (ref 11.4–15.2)

## 2021-04-04 LAB — GLUCOSE, CAPILLARY: Glucose-Capillary: 142 mg/dL — ABNORMAL HIGH (ref 70–99)

## 2021-04-04 LAB — CBG MONITORING, ED: Glucose-Capillary: 174 mg/dL — ABNORMAL HIGH (ref 70–99)

## 2021-04-04 MED ORDER — VANCOMYCIN HCL 1750 MG/350ML IV SOLN
1750.0000 mg | Freq: Once | INTRAVENOUS | Status: AC
  Administered 2021-04-04: 13:00:00 1750 mg via INTRAVENOUS
  Filled 2021-04-04: qty 350

## 2021-04-04 MED ORDER — INSULIN ASPART 100 UNIT/ML IJ SOLN
0.0000 [IU] | Freq: Three times a day (TID) | INTRAMUSCULAR | Status: DC
  Administered 2021-04-04: 18:00:00 2 [IU] via SUBCUTANEOUS

## 2021-04-04 MED ORDER — IOHEXOL 350 MG/ML SOLN
75.0000 mL | Freq: Once | INTRAVENOUS | Status: AC | PRN
  Administered 2021-04-04: 13:00:00 75 mL via INTRAVENOUS

## 2021-04-04 MED ORDER — QUETIAPINE FUMARATE 25 MG PO TABS
25.0000 mg | ORAL_TABLET | Freq: Every day | ORAL | Status: DC
  Administered 2021-04-05 – 2021-04-12 (×8): 25 mg via ORAL
  Filled 2021-04-04 (×8): qty 1

## 2021-04-04 MED ORDER — ACETAMINOPHEN 325 MG PO TABS
650.0000 mg | ORAL_TABLET | Freq: Four times a day (QID) | ORAL | Status: DC | PRN
  Administered 2021-04-05 – 2021-04-10 (×5): 650 mg via ORAL
  Filled 2021-04-04 (×5): qty 2

## 2021-04-04 MED ORDER — LACTATED RINGERS IV BOLUS
2000.0000 mL | Freq: Once | INTRAVENOUS | Status: AC
  Administered 2021-04-04: 13:00:00 2000 mL via INTRAVENOUS

## 2021-04-04 MED ORDER — SERTRALINE HCL 100 MG PO TABS
100.0000 mg | ORAL_TABLET | Freq: Every morning | ORAL | Status: DC
  Administered 2021-04-05 – 2021-04-12 (×8): 100 mg via ORAL
  Filled 2021-04-04 (×8): qty 1

## 2021-04-04 MED ORDER — SODIUM CHLORIDE 0.9 % IV BOLUS
1000.0000 mL | Freq: Once | INTRAVENOUS | Status: AC
Start: 2021-04-04 — End: 2021-04-04
  Administered 2021-04-04: 14:00:00 1000 mL via INTRAVENOUS

## 2021-04-04 MED ORDER — ACETAMINOPHEN 650 MG RE SUPP: 650.0000 mg | Freq: Four times a day (QID) | RECTAL | Status: DC | PRN

## 2021-04-04 MED ORDER — ASPIRIN 81 MG PO CHEW
81.0000 mg | CHEWABLE_TABLET | Freq: Every morning | ORAL | Status: DC
  Administered 2021-04-05 – 2021-04-12 (×8): 81 mg via ORAL
  Filled 2021-04-04 (×8): qty 1

## 2021-04-04 MED ORDER — PANTOPRAZOLE SODIUM 40 MG IV SOLR
40.0000 mg | INTRAVENOUS | Status: DC
  Administered 2021-04-04: 18:00:00 40 mg via INTRAVENOUS
  Filled 2021-04-04: qty 40

## 2021-04-04 MED ORDER — TEMAZEPAM 15 MG PO CAPS
30.0000 mg | ORAL_CAPSULE | Freq: Every day | ORAL | Status: DC
  Administered 2021-04-05 – 2021-04-12 (×8): 30 mg via ORAL
  Filled 2021-04-04 (×8): qty 2

## 2021-04-04 MED ORDER — SODIUM CHLORIDE 0.9 % IV SOLN: INTRAVENOUS | Status: AC

## 2021-04-04 MED ORDER — INSULIN GLARGINE-YFGN 100 UNIT/ML ~~LOC~~ SOLN
10.0000 [IU] | Freq: Every day | SUBCUTANEOUS | Status: DC
  Administered 2021-04-05 – 2021-04-12 (×8): 10 [IU] via SUBCUTANEOUS
  Filled 2021-04-04 (×9): qty 0.1

## 2021-04-04 MED ORDER — VANCOMYCIN HCL 1250 MG/250ML IV SOLN
1250.0000 mg | INTRAVENOUS | Status: DC
  Administered 2021-04-06: 1250 mg via INTRAVENOUS
  Filled 2021-04-04: qty 250

## 2021-04-04 MED ORDER — LACTATED RINGERS IV BOLUS: 30.0000 mL/kg | Freq: Once | INTRAVENOUS | Status: DC

## 2021-04-04 MED ORDER — ENOXAPARIN SODIUM 40 MG/0.4ML IJ SOSY
40.0000 mg | PREFILLED_SYRINGE | INTRAMUSCULAR | Status: DC
  Administered 2021-04-04 – 2021-04-12 (×8): 40 mg via SUBCUTANEOUS
  Filled 2021-04-04 (×8): qty 0.4

## 2021-04-04 MED ORDER — HYDROMORPHONE HCL 1 MG/ML IJ SOLN
0.5000 mg | INTRAMUSCULAR | Status: DC | PRN
  Administered 2021-04-05 – 2021-04-10 (×4): 1 mg via INTRAVENOUS
  Filled 2021-04-04 (×5): qty 1

## 2021-04-04 MED ORDER — ONDANSETRON HCL 4 MG PO TABS: 4.0000 mg | ORAL_TABLET | Freq: Four times a day (QID) | ORAL | Status: DC | PRN

## 2021-04-04 MED ORDER — ONDANSETRON HCL 4 MG/2ML IJ SOLN
4.0000 mg | Freq: Four times a day (QID) | INTRAMUSCULAR | Status: DC | PRN
  Administered 2021-04-05 – 2021-04-11 (×2): 4 mg via INTRAVENOUS
  Filled 2021-04-04 (×2): qty 2

## 2021-04-04 MED ORDER — SODIUM CHLORIDE 0.9 % IV SOLN: 2.0000 g | Freq: Two times a day (BID) | INTRAVENOUS | Status: DC

## 2021-04-04 MED ORDER — DIAZEPAM 5 MG/ML IJ SOLN
2.5000 mg | Freq: Once | INTRAMUSCULAR | Status: AC
  Administered 2021-04-04: 21:00:00 2.5 mg via INTRAVENOUS
  Filled 2021-04-04: qty 2

## 2021-04-04 MED ORDER — VANCOMYCIN HCL 1500 MG/300ML IV SOLN: 1500.0000 mg | Freq: Once | INTRAVENOUS | Status: DC

## 2021-04-04 MED ORDER — PIPERACILLIN-TAZOBACTAM 3.375 G IVPB
3.3750 g | Freq: Three times a day (TID) | INTRAVENOUS | Status: DC
  Administered 2021-04-04 – 2021-04-10 (×18): 3.375 g via INTRAVENOUS
  Filled 2021-04-04 (×18): qty 50

## 2021-04-04 MED ORDER — PIPERACILLIN-TAZOBACTAM 3.375 G IVPB 30 MIN
3.3750 g | Freq: Once | INTRAVENOUS | Status: AC
  Administered 2021-04-04: 12:00:00 3.375 g via INTRAVENOUS
  Filled 2021-04-04: qty 50

## 2021-04-04 NOTE — ED Provider Notes (Addendum)
[facility] Provider Note   CSN: [id number] Arrival date & time: 04/04/21  1003     History Chief Complaint  Patient presents with   Blood Infection    Surgical site neck   Fever    Tonya Baker is a 71 y.o. female.  HPI  70 year old female with a history of DM2, OSA, pseudoarthrosis of the lumbar spine status post spinal surgery in early December with neurosurgery (L5-S1 pseudoarthrosis with extension to the pelvis revision) who did not follow-up outpatient who presents to the emergency department with a complaint of worsening back drainage, back pain.  Patient reportedly endorsed fever beginning last night around 8 PM, subjective and low-grade.  He was febrile with EMS with a temperature of 102.  Given 500 cc of IV fluids in route.  Patient arrived to the emergency department alert and oriented to name but disoriented to place and year.  Will follow commands.  Initially placed on oxygen due to room air sats of 89%.  Level 5 caveat due to patient mental status change.  Past Medical History:  Diagnosis Date   Back pain    Obstructive sleep apnea    Type 2 diabetes mellitus with diabetic autonomic neuropathy, with long-term current use of insulin [facility])     Patient Active Problem List   Diagnosis Date Noted   Abscess of back    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   Bacteremia due to Streptococcus    Volume overload 99991111   Hardware complicating wound infection ([place])    Vertebral osteomyelitis ([place])    Back pain 12/28/2020   Hypotension 12/28/2020   CKD (chronic kidney disease) stage 3, GFR 30-59 ml/min ([place]) 12/28/2020   Spinal stenosis 12/28/2020   Obstructive sleep apnea 12/28/2020   Sepsis ([place]) 12/28/2020   Type 2 diabetes mellitus with diabetic neuropathy, with long-term current use of insulin ([place]) 12/28/2020    Past Surgical History:  Procedure Laterality Date   APPLICATION OF ROBOTIC ASSISTANCE FOR SPINAL PROCEDURE  N/A 03/10/2021   Procedure: APPLICATION OF ROBOTIC ASSISTANCE FOR SPINAL PROCEDURE;  Surgeon: Judith Part, MD;  Location: [place];  Service: Neurosurgery;  Laterality: N/A;   BACK SURGERY N/A 06/2020   BREAST SURGERY       OB History   No obstetric history on file.     History reviewed. No pertinent family history.  Social History   Tobacco Use   Smoking status: Former    Types: Cigarettes    Quit date: 1990    Years since quitting: 33.0   Smokeless tobacco: Never  Substance Use Topics   Alcohol use: Not Currently    Comment: socially   Drug use: Never    Home Medications Prior to Admission medications   Medication Sig Start Date End Date Taking? Authorizing Provider  albuterol (PROVENTIL) (2.5 MG/3ML) 0.083% nebulizer solution Take 2.5 mg by nebulization every 8 (eight) hours as needed (congestion).   Yes [provider]  aspirin 81 MG chewable tablet Chew 81 mg by mouth every morning.   Yes [provider]  atorvastatin (LIPITOR) 40 MG tablet Take 40 mg by mouth at bedtime. 2100   Yes [provider]  bisacodyl (DULCOLAX) 10 MG suppository Place 10 mg rectally daily as needed (constipation).   Yes [provider]  cholecalciferol (VITAMIN D3) 25 MCG (1000 UNIT) tablet Take 1,000 Units by mouth every morning.   Yes [provider]  cyclobenzaprine (FLEXERIL) 10 MG tablet Take 10 mg  by mouth 3 (three) times daily.   Yes [provider]  docusate sodium (COLACE) 100 MG capsule Take 100 mg by mouth every morning.   Yes [provider]  doxycycline (VIBRAMYCIN) 100 MG capsule Take 100 mg by mouth every 12 (twelve) hours. 2100   Yes [provider]  Dulaglutide (TRULICITY) [id number] SOPN Inject 0.75 mg into the skin every Monday.   Yes [provider]  ferrous sulfate 325 (65 FE) MG tablet Take 325 mg by mouth every morning.   Yes [provider]  furosemide (LASIX) 40 MG tablet Take  40 mg by mouth every morning.   Yes [provider]  gabapentin (NEURONTIN) 300 MG capsule Take 600 mg by mouth 3 (three) times daily.   Yes [provider]  Insulin Glargine (BASAGLAR KWIKPEN) 100 UNIT/ML Inject 60 Units into the skin daily.   Yes [provider]  insulin glargine (LANTUS) 100 UNIT/ML injection Inject 60 Units into the skin daily. Takes Lantus between 0800- 0900.   Yes [provider]  magnesium citrate SOLN Take 30 mLs by mouth every 12 (twelve) hours as needed (constipation).   Yes [provider]  metoCLOPramide (REGLAN) 5 MG tablet Take 5 mg by mouth 4 (four) times daily -  before meals and at bedtime.   Yes [provider]  midodrine (PROAMATINE) 10 MG tablet Take 3 tablets (30 mg total) by mouth 3 (three) times daily. Patient taking differently: Take 20 mg by mouth 3 (three) times daily. 01/02/21  Yes Ghimire, Dante Gang, MD  mineral oil (FLEET OIL) enema Place 1 enema rectally daily as needed (impaction).   Yes [provider]  morphine (MS CONTIN) 15 MG 12 hr tablet Take 15 mg by mouth every 12 (twelve) hours.   Yes [provider]  Multiple Vitamin (MULTIVITAMIN WITH MINERALS) TABS tablet Take 1 tablet by mouth every morning.   Yes [provider]  NOVOLOG FLEXPEN 100 UNIT/ML FlexPen Inject 2-10 Units into the skin See admin instructions. Inject as per sliding scale: 151-200 = 2   201-250 = 4   251-300 = 6   301-350 = 8   351-400 = 10. 03/10/21  Yes [provider]  Oxycodone HCl 10 MG TABS Take 10 mg by mouth every 8 (eight) hours as needed (leg pain).   Yes [provider]  pantoprazole (PROTONIX) 40 MG tablet Take 40 mg by mouth 2 (two) times daily.   Yes [provider]  polyethylene glycol (MIRALAX / GLYCOLAX) 17 g packet Take 17 g by mouth every morning.   Yes [provider]  potassium chloride SA (KLOR-CON) 20 MEQ tablet Take 20 mEq by mouth every morning.    Yes [provider]  QUEtiapine (SEROQUEL) 25 MG tablet Take 25 mg by mouth at bedtime. 2100   Yes [provider]  sertraline (ZOLOFT) 100 MG tablet Take 100 mg by mouth every morning.   Yes [provider]  Skin Protectants, Misc. (EUCERIN) cream Apply 1 application topically daily.   Yes [provider]  temazepam (RESTORIL) 30 MG capsule Take 30 mg by mouth at bedtime.   Yes [provider]  acetaminophen (TYLENOL) 325 MG tablet Take 650 mg by mouth every 6 (six) hours as needed (pain).    [provider]  insulin lispro (HUMALOG) 100 UNIT/ML injection Inject 2-10 Units into the skin 4 (four) times daily -  before meals and at bedtime. Sliding Scale:   CBG 151-200  2 units, 201-250 4 units, 251-300 6 units, 301-350 8 units, 351-400 10 units,    [provider]    Allergies    Duloxetine hcl, Cortisone, Hydrocortisone, and Latex  Review of Systems   Review of Systems  Unable to perform ROS: Mental status change   Physical Exam Updated Vital Signs BP 102/61 (BP Location: Left Arm)    Pulse 74    Temp (!) 97.5 F (36.4 C) (Oral)    Resp 15    Ht 5\' 4"  (1.626 m)    Wt 86.2 kg    SpO2 100%    BMI 32.61 kg/m   Physical Exam Vitals and nursing note reviewed.  Constitutional:      General: She is not in acute distress.    Appearance: She is well-developed.     Comments: GCS 14, AAO x1, ABC intact  HENT:     Head: Normocephalic and atraumatic.  Eyes:     Conjunctiva/sclera: Conjunctivae normal.  Cardiovascular:     Rate and Rhythm: Normal rate and regular rhythm.     Heart sounds: No murmur heard. Pulmonary:     Effort: Pulmonary effort is normal. No respiratory distress.     Breath sounds: Normal breath sounds.  Abdominal:     Palpations: Abdomen is soft.     Tenderness: There is no abdominal tenderness.  Musculoskeletal:        General: No swelling.     Cervical back: Neck supple.     Comments: Mild  erythema  around the patient's prior surgical incision site with few scattered small areas of dehiscence of the incision, mild serous drainage present, bilateral surrounding tenderness to palpation with some warmth  Skin:    General: Skin is warm and dry.     Capillary Refill: Capillary refill takes less than 2 seconds.  Neurological:     Mental Status: She is alert.     Comments: GCS 14, no cranial nerve deficit, moving all 4 extremities with intact 5 out of 5 strength in the bilateral lower extremities at hip flexion but subjective weakness in the bilateral lower extremities at attempts at leg raise.  Psychiatric:        Mood and Affect: Mood normal.      ED Results / Procedures / Treatments   Labs (all labs ordered are listed, but only abnormal results are displayed) Labs Reviewed  LACTIC ACID, PLASMA - Abnormal; Notable for the following components:      Result Value   Lactic Acid, Venous 2.0 (*)    All other components within normal limits  COMPREHENSIVE METABOLIC PANEL - Abnormal; Notable for the following components:   Glucose, Bld 159 (*)    BUN 27 (*)    Creatinine, Ser 1.46 (*)    Calcium 8.7 (*)    Albumin 2.8 (*)    Alkaline Phosphatase 185 (*)    GFR, Estimated 39 (*)    All other components within normal limits  CBC WITH DIFFERENTIAL/PLATELET - Abnormal; Notable for the following components:   WBC 11.2 (*)    Hemoglobin 11.1 (*)    HCT 35.4 (*)    Neutro Abs 9.6 (*)    All other components within normal limits  GLUCOSE, CAPILLARY - Abnormal; Notable for the following components:   Glucose-Capillary 142 (*)    All other components within normal limits  CBG MONITORING, ED - Abnormal; Notable for the following components:   Glucose-Capillary 174 (*)    All other components within  normal limits  I-STAT VENOUS BLOOD GAS, ED - Abnormal; Notable for the following components:   pO2, Ven 170.0 (*)    Bicarbonate 28.8 (*)    Acid-Base Excess 3.0 (*)    Calcium, Ion 1.12 (*)     HCT 32.0 (*)    Hemoglobin 10.9 (*)    All other components within normal limits  RESP PANEL BY RT-PCR (FLU A&B, COVID) ARPGX2  CULTURE, BLOOD (ROUTINE X 2)  CULTURE, BLOOD (ROUTINE X 2)  URINE CULTURE  LACTIC ACID, PLASMA  PROTIME-INR  APTT  AMMONIA  TSH  CORTISOL  URINALYSIS, ROUTINE W REFLEX MICROSCOPIC  RAPID URINE DRUG SCREEN, HOSP PERFORMED  CBC  BASIC METABOLIC PANEL    EKG EKG Interpretation  Date/Time:  Friday April 04 2021 10:09:29 EST Ventricular Rate:  96 PR Interval:  159 QRS Duration: 92 QT Interval:  365 QTC Calculation: 462 R Axis:   168 Text Interpretation: Sinus rhythm Right axis deviation Low voltage, precordial leads Borderline T abnormalities, anterior leads Confirmed by Regan Lemming ([id number]) on 04/04/2021 12:30:07 PM  Radiology CT HEAD WO CONTRAST (5MM)  Result Date: 04/04/2021 CLINICAL DATA:  Mental status change, unknown cause EXAM: CT HEAD WITHOUT CONTRAST TECHNIQUE: Contiguous axial images were obtained from the base of the skull through the vertex without intravenous contrast. COMPARISON:  Head CT 03/11/2021 FINDINGS: Brain: No evidence of acute intracranial hemorrhage or extra-axial collection.No evidence of mass lesion/concern mass effect.The ventricles are normal in size. Vascular: No hyperdense vessel or unexpected calcification. Skull: Normal. Negative for fracture or focal lesion. Sinuses/Orbits: No acute finding. Other: None. IMPRESSION: No acute intracranial abnormality. Electronically Signed   By: Maurine Simmering M.D.   On: 04/04/2021 13:26   CT ABDOMEN PELVIS W CONTRAST  Result Date: 04/04/2021 CLINICAL DATA:  Fever and back pain. Altered mental status. Concern for sepsis. Recent lumbar surgery with drainage from the surgical site. EXAM: CT ABDOMEN AND PELVIS WITH CONTRAST TECHNIQUE: Multidetector CT imaging of the abdomen and pelvis was performed using the standard protocol following bolus administration of intravenous contrast. CONTRAST:   36mL OMNIPAQUE IOHEXOL 350 MG/ML SOLN COMPARISON:  None. FINDINGS: Lower chest: No acute abnormality. Hepatobiliary: No focal liver abnormality is seen. Status post cholecystectomy. No biliary dilatation. Pancreas: Moderate atrophy. No ductal dilatation or surrounding inflammatory changes. Spleen: Mildly enlarged.  No focal abnormality. Adrenals/Urinary Tract: Unchanged homogeneously enhancing 1.1 cm right adrenal nodule, indeterminate on prior CTs. The left adrenal gland is unremarkable. Kidneys are normal without mass, calculi, or hydronephrosis. Trace focus of air within the bladder likely related to recent instrumentation. The bladder is otherwise unremarkable. Stomach/Bowel: Stomach is within normal limits. Appendix appears normal. No evidence of bowel wall thickening, distention, or inflammatory changes. Vascular/Lymphatic: Aortoiliac atherosclerotic vascular disease. Prominent subcentimeter right inguinal and external iliac lymph nodes are likely reactive. Reproductive: Uterus and bilateral adnexa are unremarkable. Other: No free fluid or pneumoperitoneum. Musculoskeletal: Prior L1-S1 fusion with interval revision at L5-S1 and Barabas extension of the fusion to the pelvis. Unchanged lucency around the L5-S1 interbody graft with adjacent endplate sclerosis. Continued lucency around the S1 screws. No progressive bony destruction. Similar scarring in the posterior midline lumbar soft tissues with [place] 2.7 x 4.0 x 10.6 cm thick-walled rim enhancing gas and fluid collection in the midline extending from T11 to L3. IMPRESSION: 1. [place] 2.7 x 4.0 x 10.6 cm subcutaneous abscess in the midline back extending from T11 to L3. 2. Prior L1-S1 fusion with interval revision at L5-S1 and Finerty extension of the  fusion to the pelvis. Unchanged L5-S1 pseudoarthrosis. 3. Unchanged 1.1 cm indeterminate right adrenal nodule. This is probably benign. Consider follow-up adrenal protocol CT in 12 months. This recommendation follows ACR  consensus guidelines: Management of Incidental Adrenal Masses: A White Paper of the ACR Incidental Findings Committee. J Am Coll Radiol 2017;14:1038-1044. 4. Mild splenomegaly. 5. Aortic Atherosclerosis (ICD10-I70.0). Electronically Signed   By: Titus Dubin M.D.   On: 04/04/2021 13:44   CT L-SPINE NO CHARGE  Result Date: 04/04/2021 CLINICAL DATA:  Back pain EXAM: CT LUMBAR SPINE WITHOUT CONTRAST TECHNIQUE: Multidetector CT imaging of the lumbar spine was performed without intravenous contrast administration. Multiplanar CT image reconstructions were also generated. COMPARISON:  None. FINDINGS: Segmentation: Normal Alignment: Unchanged alignment. Vertebrae: Stable vertebral body heights with most notable compression deformities at L2, L3, and L4. No evidence of progressive height loss. Paraspinal and other soft tissues: There is a thick-walled rim enhancing collection with punctate foci of gas along the posterior soft tissues Holts since the prior CT consistent with interval surgery. This measures up to 3.5 x 2.8 cm (series 4, image 15). Disc levels: Unchanged chronic postoperative changes from L1 through L4. Solid interbody arthrodesis from L1 through L4. L4-L5: Posterior decompression, anterior posterior fusion stable in comparison to prior exam. There is a single right-sided L4 pedicle screw. Unchanged degree of developing interbody arthrodesis with mild solid bridging posteriorly. L5-S1: Interval spinal fusion revision, with replacement of the bilateral S1 screws and a Keithly fusion through the pelvis. Duran hardware is intact. There is persistent mild lucency along the S1 screw tracks. Stable positioning of the interbody implant despite this substantial adjacent endplate bone loss. Prior posterior decompression at this level as well. IMPRESSION: Interval lumbosacral fusion revision with bilateral S1 screw replacement and L4 to pelvis posterolateral fusion. Vary hardware is intact. Unchanged L5-S1  pseudoarthrosis. Persistent lucency along the S1 screw tracks. Thick wall rim enhancing collection along the posterior subacute soft tissues consistent recent surgery measuring 3.5 x 2.8 cm. The sterility of this collection is indeterminate by CT. Unchanged interbody arthrodesis from L1 through L4 and mild posterior element arthrodesis at L4-L5. Electronically Signed   By: Maurine Simmering M.D.   On: 04/04/2021 13:43   DG Chest Port 1 View  Result Date: 04/04/2021 CLINICAL DATA:  Questionable sepsis EXAM: PORTABLE CHEST 1 VIEW COMPARISON:  Chest radiograph 03/11/2021 FINDINGS: The right IJ vascular catheter has been removed. The cardiomediastinal silhouette is stable. Lung volumes are markedly diminished. There is no focal consolidation or pulmonary edema. There is no pleural effusion or pneumothorax. There is no acute osseous abnormality. IMPRESSION: Markedly low lung volumes. Otherwise, no radiographic evidence of acute cardiopulmonary process. Electronically Signed   By: Valetta Mole M.D.   On: 04/04/2021 10:59    Procedures .Critical Care Performed by: Regan Lemming, MD Authorized by: Regan Lemming, MD   Critical care provider statement:    Critical care time (minutes):  35   Critical care was necessary to treat or prevent imminent or life-threatening deterioration of the following conditions:  Sepsis   Critical care was time spent personally by me on the following activities:  Blood draw for specimens, discussions with consultants, examination of patient, obtaining history from patient or surrogate, ordering and performing treatments and interventions, ordering and review of laboratory studies, ordering and review of radiographic studies, pulse oximetry, re-evaluation of patient's condition and review of old charts   Care discussed with: admitting provider     Medications Ordered in ED Medications  vancomycin (  VANCOREADY) IVPB 1250 mg/250 mL (has no administration in time range)  aspirin  chewable tablet 81 mg (has no administration in time range)  QUEtiapine (SEROQUEL) tablet 25 mg (has no administration in time range)  sertraline (ZOLOFT) tablet 100 mg (has no administration in time range)  temazepam (RESTORIL) capsule 30 mg (has no administration in time range)  pantoprazole (PROTONIX) injection 40 mg (40 mg Intravenous Given 04/04/21 1806)  enoxaparin (LOVENOX) injection 40 mg (40 mg Subcutaneous Given 04/04/21 1807)  0.9 %  sodium chloride infusion ( Intravenous Stinson Bag/Given 04/04/21 1806)  acetaminophen (TYLENOL) tablet 650 mg (has no administration in time range)    Or  acetaminophen (TYLENOL) suppository 650 mg (has no administration in time range)  HYDROmorphone (DILAUDID) injection 0.5-1 mg (has no administration in time range)  ondansetron (ZOFRAN) tablet 4 mg (has no administration in time range)    Or  ondansetron (ZOFRAN) injection 4 mg (has no administration in time range)  insulin aspart (novoLOG) injection 0-15 Units (2 Units Subcutaneous Given 04/04/21 1806)  insulin glargine-yfgn (SEMGLEE) injection 10 Units (has no administration in time range)  piperacillin-tazobactam (ZOSYN) IVPB 3.375 g (3.375 g Intravenous [place] Bag/Given 04/04/21 2011)  diazepam (VALIUM) injection 2.5 mg (has no administration in time range)  piperacillin-tazobactam (ZOSYN) IVPB 3.375 g (0 g Intravenous Stopped 04/04/21 1300)  vancomycin (VANCOREADY) IVPB 1750 mg/350 mL (0 mg Intravenous Stopped 04/04/21 1601)  lactated ringers bolus 2,000 mL (2,000 mLs Intravenous Alen Bag/Given 04/04/21 1318)  iohexol (OMNIPAQUE) 350 MG/ML injection 75 mL (75 mLs Intravenous Contrast Given 04/04/21 1319)  sodium chloride 0.9 % bolus 1,000 mL (1,000 mLs Intravenous Grudzinski Bag/Given 04/04/21 1427)    ED Course  I have reviewed the triage vital signs and the nursing notes.  Pertinent labs & imaging results that were available during my care of the patient were reviewed by me and considered in my medical  decision making (see chart for details).    MDM Rules/Calculators/A&P                          70 year old female with a history of DM2, OSA, pseudoarthrosis of the lumbar spine status post spinal surgery in early December with neurosurgery (L5-S1 pseudoarthrosis with extension to the pelvis revision) who did not follow-up outpatient who presents to the emergency department with a complaint of worsening back drainage, back pain.  Patient reportedly endorsed fever beginning last night around 8 PM, subjective and low-grade.  He was febrile with EMS with a temperature of 102.  Given 500 cc of IV fluids in route.  Patient arrived to the emergency department alert and oriented to name but disoriented to place and year.  Will follow commands.  Initially placed on oxygen due to room air sats of 89%.  On arrival, the patient was afebrile, not tachycardic or tachypneic, borderline hypotensive, BP 97/60, saturating 97% on 3 L O2 via nasal cannula.  Patient presents with back pain, report of increasing drainage from her surgical site.  Reportedly febrile prior to arrival.  Not initially meeting SIRS criteria but concern for possible infection of the patient's surgical site.  Screening labs and imaging obtained on arrival.  With reported fever prior to arrival, leukocytosis noted on laboratory work up to 11.2, concern for sepsis.  The patient was administered vancomycin and Zosyn and 2 L IV fluids for volume resuscitation.  No acute neurologic deficit concerning for stroke.  Unclear status of the patient's change in mental status  with an unclear baseline at this time.  No bowel or bladder incontinence or bilateral leg weakness concerning for cauda equina at this time.  A CT abdomen pelvis was performed Which revealed a Featherly 2.7 x 4.0 x 10.6 cm subcutaneous abscess in the midline back extending from T11-L3.  Due to this finding, neurosurgery was consulted for further recommendations.  Further recommendations included  MRI imaging of the thoracic and lumbar spine, admission to hospitalist medicine for continued IV antibiotics and further care management.  On reassessment, the patient remained mildly encephalopathic but cooperative.  She had borderline episodes of hypotension which improved following initial volume resuscitation.  Concern for severe sepsis in the setting of the patient's back abscess.  Initial lactic acid was mildly elevated to 2.0, repeat was normal following fluid resuscitation.  COVID-19 and influenza PCR was collected and resulted negative.  Blood cultures and urine cultures were obtained prior to antibiotic administration.  Additionally laboratory work-up for patient's mental status change significant for negative ammonia, CMP without significant electrolyte abnormality, mildly elevated BUN to 27, creatinine 1.46, calcium 8.7, VBG without acidosis or alkalosis.  TSH normal.  CT head did not reveal an acute intracranial abnormality.  Suspect metabolic encephalopathy in the setting of the patient's an acute infection.  Hospitalist medicine consulted for admission and Dr. Chipper Herb accepted the patient in admission to his service.   Final Clinical Impression(s) / ED Diagnoses Final diagnoses:  Abscess of back  Sepsis with encephalopathy, due to unspecified organism, unspecified whether septic shock present [facility])    Rx / DC Orders ED Discharge Orders     None        Ernie Avena, MD 04/04/21 2053    Ernie Avena, MD 04/04/21 [phone]

## 2021-04-04 NOTE — ED Notes (Signed)
Pt's BP went down to 82/48. MD Chipper Herb made aware.

## 2021-04-04 NOTE — ED Notes (Signed)
Tabitha Pass (Daughter) called asking for an update in [place]. call back number is [phone]

## 2021-04-04 NOTE — ED Triage Notes (Signed)
Pt from [facility], had recent neck surgery, has been unable to follow up with surgical team, fever beginning last night 8pm ,  given tylenolo at that time and also by ems prior to arrival for temp 102.  Given IV fluids en route.  Pt is able to provide name and date of birth but confused to time, event.  Follows commands.  Room air sat 89%, arrives on oxygyn 3l nasaol with sat 98%

## 2021-04-04 NOTE — ED Notes (Signed)
Patient transported to CT 

## 2021-04-04 NOTE — ED Notes (Signed)
Pt drowsy on arrival, easily rouseable, answers questions appropriately and follows commands.  Per facility staff per ems, pt began with fever last night, was given tylenol at that time, this morning drowsy and low oxygen sat on room air.

## 2021-04-04 NOTE — Progress Notes (Signed)
Pharmacy Antibiotic Note  Tonya Baker is a 70 y.o. female admitted on 04/04/2021 with cellulitis. Pharmacy has been consulted for Vancomycin dosing.  Patient presents with leukocytosis and Scr 1.46 - elevated above baseline.  Plan: Vancomycin 1750 mg IV x 1, then Vancomycin 1250 mg IV q48hr (eAUC: 476)  Follow-up WBC, renal function, clinical course. Deescalate when able. Levels at steady state.  Height: 5\' 4"  (162.6 cm) Weight: 86.2 kg (190 lb) IBW/kg (Calculated) : 54.7  Temp (24hrs), Avg:99.2 F (37.3 C), Min:99.2 F (37.3 C), Max:99.2 F (37.3 C)  Recent Labs  Lab 04/04/21 1040  WBC 11.2*  CREATININE 1.46*    Estimated Creatinine Clearance: 38.6 mL/min (A) (by C-G formula based on SCr of 1.46 mg/dL (H)).    Allergies  Allergen Reactions   Duloxetine Hcl Other (See Comments)     tremors   Cortisone Other (See Comments)    Unknown reaction   Hydrocortisone Other (See Comments)    Unknown reaction   Latex Other (See Comments)    Unknown reaction    Antimicrobials this admission: Vancomycin 12/30 >>  Microbiology results: 12/30 BCx: collected 12/30 UCx: to be collected   Thank you for allowing pharmacy to be a part of this patients care.  1/31 PharmD Candidate 04/04/2021 11:46 AM

## 2021-04-04 NOTE — Progress Notes (Signed)
Pharmacy Antibiotic Note  Tonya Baker is a 70 y.o. female admitted on 04/04/2021 with sepsis and cellulitis.  Pharmacy has been consulted for zosyn and vancomycin dosing.  SCr 1.46 - above baseline WBC 11.2; LA 2; T 99.2 F  Plan: Vancomycin 1750 mg IV x 1, then Vancomycin 1250 mg IV q48hr (eAUC: 476) Zosyn 3.375g IV q8h (4 hour infusion) Trend WBC, Fever, Renal function, & Clinical course F/u cultures, clinical course, WBC, fever De-escalate when able Levels at steady state  Height: 5\' 4"  (162.6 cm) Weight: 86.2 kg (190 lb) IBW/kg (Calculated) : 54.7  Temp (24hrs), Avg:99.2 F (37.3 C), Min:99.2 F (37.3 C), Max:99.2 F (37.3 C)  Recent Labs  Lab 04/04/21 1040  WBC 11.2*  CREATININE 1.46*  LATICACIDVEN 2.0*    Estimated Creatinine Clearance: 38.6 mL/min (A) (by C-G formula based on SCr of 1.46 mg/dL (H)).    Allergies  Allergen Reactions   Duloxetine Hcl Other (See Comments)     tremors   Cortisone Other (See Comments)    Unknown reaction   Hydrocortisone Other (See Comments)    Unknown reaction   Latex Other (See Comments)    Unknown reaction    Antimicrobials this admission: Vancomycin 12/30 >> Zosyn 12/30 >>   Microbiology results: 12/30 BCx: collected 12/30 UCx: to be collected  Thank you for allowing pharmacy to be a part of this patients care.  1/31, PharmD, BCPS 04/04/2021 2:33 PM ED Clinical Pharmacist -  [phone]

## 2021-04-04 NOTE — Progress Notes (Signed)
Per pharmacy policy wasted 7.5mg  of IV diazepam in sharps container with Laney Pastor.

## 2021-04-04 NOTE — ED Notes (Signed)
Pt remains in ct 

## 2021-04-04 NOTE — H&P (Addendum)
History and Physical    Tonya Baker MVH:[id number] DOB: 10/20/1950 DOA: 04/04/2021  PCP: Tonya Northern, MD (Confirm with patient/family/NH records and if not entered, this has to be entered at [facility] point of entry) Patient coming from: Nursing Home  I have personally briefly reviewed patient's old medical records in [facility]  Chief Complaint: Back pain and fever  HPI: Tonya Baker is a 70 y.o. female with medical history significant of IIDM, diabetic neuropathy, orthostatic hypotension on midodrine, HTN, HLD, obesity, presented with sepsis.  Patient is confused unable to provide any history.  Most history obtained by review of ED chart.  Patient lives at nursing home, and recently patient underwent back surgery on 12/05 with revision of L5/S1 pseudoarthrosis, patient was discharged on 12/09 on stable conditions.  After discharge, patient was not able to follow-up with surgical team.  It appears that patient started to have back pain and fever last night.  EMS arrived this morning found patient feverish temperature 102, and was given IV fluid bolus 500.  Patient complaining about back pain, denies any weakness or numbness of the lower extremities, denies any urinary problems or bowel movement problems.  ED Course: Blood pressure low, and received total of 2 L IV boluses blood pressure still borderline low.  Patient remains confused oriented only to person and place confused about time.  CT abdomen pelvis showed large 2.7 x 4.0 x 2.6 cm subcutaneous abscess in the midline but extending from T11-L3.  Lactic acid 2.0, WBC 11.2, creatinine 1.4 compared to baseline 0.98, antibiotics including vancomycin and Zosyn.  Neurosurgery consulted.  Review of Systems: Unable to perform, patient confused.   Past Medical History:  Diagnosis Date   Back pain    Obstructive sleep apnea    Type 2 diabetes mellitus with diabetic autonomic neuropathy, with long-term current use of insulin (HCC)     Past  Surgical History:  Procedure Laterality Date   APPLICATION OF ROBOTIC ASSISTANCE FOR SPINAL PROCEDURE N/A 03/10/2021   Procedure: APPLICATION OF ROBOTIC ASSISTANCE FOR SPINAL PROCEDURE;  Surgeon: Tonya Pierini, MD;  Location: [facility] OR;  Service: Neurosurgery;  Laterality: N/A;   BACK SURGERY N/A 06/2020   BREAST SURGERY       reports that she quit smoking about 33 years ago. Her smoking use included cigarettes. She has never used smokeless tobacco. She reports that she does not currently use alcohol. She reports that she does not use drugs.  Allergies  Allergen Reactions   Duloxetine Hcl Other (See Comments)     tremors   Cortisone Other (See Comments)    Unknown reaction   Hydrocortisone Other (See Comments)    Unknown reaction   Latex Other (See Comments)    Unknown reaction    History reviewed. No pertinent family history.   Prior to Admission medications   Medication Sig Start Date End Date Taking? Authorizing Provider  acetaminophen (TYLENOL) 325 MG tablet Take 650 mg by mouth every 6 (six) hours as needed (pain).    [provider]  albuterol (PROVENTIL) (2.5 MG/3ML) 0.083% nebulizer solution Take 2.5 mg by nebulization every 8 (eight) hours as needed (congestion).    [provider]  aspirin 81 MG chewable tablet Chew 81 mg by mouth every morning.    [provider]  atorvastatin (LIPITOR) 40 MG tablet Take 40 mg by mouth at bedtime. 2100    [provider]  bisacodyl (DULCOLAX) 10 MG suppository Place 10 mg rectally daily as needed (constipation).  [provider]  cholecalciferol (VITAMIN D3) 25 MCG (1000 UNIT) tablet Take 1,000 Units by mouth every morning.    [provider]  cyclobenzaprine (FLEXERIL) 10 MG tablet Take 10 mg by mouth 3 (three) times daily.    [provider]  docusate sodium (COLACE) 100 MG capsule Take 100 mg by mouth every morning.    [provider]  doxycycline (VIBRAMYCIN)  100 MG capsule Take 100 mg by mouth every 12 (twelve) hours. 2100    [provider]  Dulaglutide (TRULICITY) 0.75 MG/0.5ML SOPN Inject 0.75 mg into the skin every Monday.    [provider]  ferrous sulfate 325 (65 FE) MG tablet Take 325 mg by mouth every morning.    [provider]  furosemide (LASIX) 40 MG tablet Take 40 mg by mouth every morning.    [provider]  gabapentin (NEURONTIN) 300 MG capsule Take 600 mg by mouth 3 (three) times daily.    [provider]  insulin glargine (LANTUS) 100 UNIT/ML injection Inject 60 Units into the skin daily. Takes Lantus between 0800- 0900.    [provider]  insulin lispro (HUMALOG) 100 UNIT/ML injection Inject 2-10 Units into the skin 4 (four) times daily -  before meals and at bedtime. Sliding Scale:   CBG 151-200 2 units, 201-250 4 units, 251-300 6 units, 301-350 8 units, 351-400 10 units,    [provider]  magnesium citrate SOLN Take 30 mLs by mouth every 12 (twelve) hours as needed (constipation).    [provider]  metoCLOPramide (REGLAN) 5 MG tablet Take 5 mg by mouth 4 (four) times daily -  before meals and at bedtime.    [provider]  midodrine (PROAMATINE) 10 MG tablet Take 3 tablets (30 mg total) by mouth 3 (three) times daily. 01/02/21   Dorcas Carrow, MD  mineral oil (FLEET OIL) enema Place 1 enema rectally daily as needed (impaction).    [provider]  morphine (MS CONTIN) 15 MG 12 hr tablet Take 15 mg by mouth every 12 (twelve) hours.    [provider]  Multiple Vitamin (MULTIVITAMIN WITH MINERALS) TABS tablet Take 1 tablet by mouth every morning.    [provider]  Oxycodone HCl 10 MG TABS Take 10 mg by mouth every 8 (eight) hours as needed (leg pain).    [provider]  pantoprazole (PROTONIX) 40 MG tablet Take 40 mg by mouth 2 (two) times daily.    [provider]  polyethylene glycol (MIRALAX /  GLYCOLAX) 17 g packet Take 17 g by mouth every morning.    [provider]  potassium chloride SA (KLOR-CON) 20 MEQ tablet Take 20 mEq by mouth every morning.    [provider]  QUEtiapine (SEROQUEL) 25 MG tablet Take 25 mg by mouth at bedtime. 2100    [provider]  sertraline (ZOLOFT) 100 MG tablet Take 100 mg by mouth every morning.    [provider]  Skin Protectants, Misc. (EUCERIN) cream Apply 1 application topically daily.    [provider]  temazepam (RESTORIL) 30 MG capsule Take 30 mg by mouth at bedtime.    [provider]    Physical Exam: Vitals:   04/04/21 1315 04/04/21 1345 04/04/21 1400 04/04/21 1415  BP: (!) 86/54 (!) 94/54 (!) 95/52 (!) 96/57  Pulse: 80 73 73 76  Resp: 16 14 14 17   Temp:      SpO2: 99% 100% 100% 100%  Weight:      Height:        Constitutional: NAD, calm, comfortable Vitals:   04/04/21 1315 04/04/21 1345 04/04/21 1400 04/04/21 1415  BP: (!) 86/54 (!) 94/54 (!) 95/52 (!) 96/57  Pulse: 80 73 73 76  Resp: 16 14 14 17   Temp:      SpO2: 99% 100% 100% 100%  Weight:      Height:       Eyes: PERRL, lids and conjunctivae normal ENMT: Mucous membranes are dry. Posterior pharynx clear of any exudate or lesions.Normal dentition.  Neck: normal, supple, no masses, no thyromegaly Respiratory: clear to auscultation bilaterally, no wheezing, no crackles. Normal respiratory effort. No accessory muscle use.  Cardiovascular: Regular rate and rhythm, no murmurs / rubs / gallops. No extremity edema. 2+ pedal pulses. No carotid bruits.  Abdomen: no tenderness, no masses palpated. No hepatosplenomegaly. Bowel sounds positive.  Musculoskeletal: no clubbing / cyanosis. No joint deformity upper and lower extremities. Good ROM, no contractures. Normal muscle tone.  Skin: no rashes, lesions, ulcers. No induration Neurologic: CN 2-12 grossly intact. Sensation intact, DTR normal. Strength 5/5 in all 4.  Psychiatric:  Normal judgment and insight. Alert and oriented x 3. Normal mood.     Labs on Admission: I have personally reviewed following labs and imaging studies  CBC: Recent Labs  Lab 04/04/21 1040 04/04/21 1331  WBC 11.2*  --   NEUTROABS 9.6*  --   HGB 11.1* 10.9*  HCT 35.4* 32.0*  MCV 88.5  --   PLT 161  --    Basic Metabolic Panel: Recent Labs  Lab 04/04/21 1040 04/04/21 1331  NA 136 138  K 3.6 3.6  CL 102  --   CO2 25  --   GLUCOSE 159*  --   BUN 27*  --   CREATININE 1.46*  --   CALCIUM 8.7*  --    GFR: Estimated Creatinine Clearance: 38.6 mL/min (A) (by C-G formula based on SCr of 1.46 mg/dL (H)). Liver Function Tests: Recent Labs  Lab 04/04/21 1040  AST 21  ALT 11  ALKPHOS 185*  BILITOT 0.7  PROT 6.9  ALBUMIN 2.8*   No results for input(s): LIPASE, AMYLASE in the last 168 hours. Recent Labs  Lab 04/04/21 1305  AMMONIA <10   Coagulation Profile: Recent Labs  Lab 04/04/21 1040  INR 1.1   Cardiac Enzymes: No results for input(s): CKTOTAL, CKMB, CKMBINDEX, TROPONINI in the last 168 hours. BNP (last 3 results) No results for input(s): PROBNP in the last 8760 hours. HbA1C: No results for input(s): HGBA1C in the last 72 hours. CBG: Recent Labs  Lab 04/04/21 1417  GLUCAP 174*   Lipid Profile: No results for input(s): CHOL, HDL, LDLCALC, TRIG, CHOLHDL, LDLDIRECT in the last 72 hours. Thyroid Function Tests: No results for input(s): TSH, T4TOTAL, FREET4, T3FREE, THYROIDAB in the last 72 hours. Anemia Panel: No results for input(s): VITAMINB12, FOLATE, FERRITIN, TIBC, IRON, RETICCTPCT in the last 72 hours. Urine analysis:    Component Value Date/Time   COLORURINE YELLOW 12/29/2020 0019   APPEARANCEUR HAZY (A) 12/29/2020 0019   LABSPEC 1.014 12/29/2020 0019   PHURINE 6.0 12/29/2020 0019   GLUCOSEU NEGATIVE 12/29/2020 0019   HGBUR SMALL (A) 12/29/2020 0019   BILIRUBINUR NEGATIVE 12/29/2020 0019   KETONESUR NEGATIVE 12/29/2020 0019   PROTEINUR 30  (A) 12/29/2020 0019   NITRITE POSITIVE (A) 12/29/2020 0019   LEUKOCYTESUR SMALL (A) 12/29/2020 0019    Radiological Exams on Admission: CT HEAD  WO CONTRAST ( )  Result Date: 04/04/2021 CLINICAL DATA:  Mental status change, unknown cause EXAM: CT HEAD WITHOUT CONTRAST TECHNIQUE: Contiguous axial images were obtained from the base of the skull through the vertex without intravenous contrast. COMPARISON:  Head CT 03/11/2021 FINDINGS: Brain: No evidence of acute intracranial hemorrhage or extra-axial collection.No evidence of mass lesion/concern mass effect.The ventricles are normal in size. Vascular: No hyperdense vessel or unexpected calcification. Skull: Normal. Negative for fracture or focal lesion. Sinuses/Orbits: No acute finding. Other: None. IMPRESSION: No acute intracranial abnormality. Electronically Signed   By: Caprice Renshaw M.D.   On: 04/04/2021 13:26   CT ABDOMEN PELVIS W CONTRAST  Result Date: 04/04/2021 CLINICAL DATA:  Fever and back pain. Altered mental status. Concern for sepsis. Recent lumbar surgery with drainage from the surgical site. EXAM: CT ABDOMEN AND PELVIS WITH CONTRAST TECHNIQUE: Multidetector CT imaging of the abdomen and pelvis was performed using the standard protocol following bolus administration of intravenous contrast. CONTRAST:  22mL OMNIPAQUE IOHEXOL 350 MG/ML SOLN COMPARISON:  None. FINDINGS: Lower chest: No acute abnormality. Hepatobiliary: No focal liver abnormality is seen. Status post cholecystectomy. No biliary dilatation. Pancreas: Moderate atrophy. No ductal dilatation or surrounding inflammatory changes. Spleen: Mildly enlarged.  No focal abnormality. Adrenals/Urinary Tract: Unchanged homogeneously enhancing 1.1 cm right adrenal nodule, indeterminate on prior CTs. The left adrenal gland is unremarkable. Kidneys are normal without mass, calculi, or hydronephrosis. Trace focus of air within the bladder likely related to recent instrumentation. The bladder is  otherwise unremarkable. Stomach/Bowel: Stomach is within normal limits. Appendix appears normal. No evidence of bowel wall thickening, distention, or inflammatory changes. Vascular/Lymphatic: Aortoiliac atherosclerotic vascular disease. Prominent subcentimeter right inguinal and external iliac lymph nodes are likely reactive. Reproductive: Uterus and bilateral adnexa are unremarkable. Other: No free fluid or pneumoperitoneum. Musculoskeletal: Prior L1-S1 fusion with interval revision at L5-S1 and Weissmann extension of the fusion to the pelvis. Unchanged lucency around the L5-S1 interbody graft with adjacent endplate sclerosis. Continued lucency around the S1 screws. No progressive bony destruction. Similar scarring in the posterior midline lumbar soft tissues with Brummell 2.7 x 4.0 x 10.6 cm thick-walled rim enhancing gas and fluid collection in the midline extending from T11 to L3. IMPRESSION: 1. Rudder 2.7 x 4.0 x 10.6 cm subcutaneous abscess in the midline back extending from T11 to L3. 2. Prior L1-S1 fusion with interval revision at L5-S1 and Locatelli extension of the fusion to the pelvis. Unchanged L5-S1 pseudoarthrosis. 3. Unchanged 1.1 cm indeterminate right adrenal nodule. This is probably benign. Consider follow-up adrenal protocol CT in 12 months. This recommendation follows ACR consensus guidelines: Management of Incidental Adrenal Masses: A White Paper of the ACR Incidental Findings Committee. J Am Coll Radiol 2017;14:1038-1044. 4. Mild splenomegaly. 5. Aortic Atherosclerosis (ICD10-I70.0). Electronically Signed   By: Obie Dredge M.D.   On: 04/04/2021 13:44   CT L-SPINE NO CHARGE  Result Date: 04/04/2021 CLINICAL DATA:  Back pain EXAM: CT LUMBAR SPINE WITHOUT CONTRAST TECHNIQUE: Multidetector CT imaging of the lumbar spine was performed without intravenous contrast administration. Multiplanar CT image reconstructions were also generated. COMPARISON:  None. FINDINGS: Segmentation: Normal Alignment: Unchanged  alignment. Vertebrae: Stable vertebral body heights with most notable compression deformities at L2, L3, and L4. No evidence of progressive height loss. Paraspinal and other soft tissues: There is a thick-walled rim enhancing collection with punctate foci of gas along the posterior soft tissues Scales since the prior CT consistent with interval surgery. This measures up to 3.5 x 2.8 cm (series 4,  image 15). Disc levels: Unchanged chronic postoperative changes from L1 through L4. Solid interbody arthrodesis from L1 through L4. L4-L5: Posterior decompression, anterior posterior fusion stable in comparison to prior exam. There is a single right-sided L4 pedicle screw. Unchanged degree of developing interbody arthrodesis with mild solid bridging posteriorly. L5-S1: Interval spinal fusion revision, with replacement of the bilateral S1 screws and a Veselka fusion through the pelvis. Kurihara hardware is intact. There is persistent mild lucency along the S1 screw tracks. Stable positioning of the interbody implant despite this substantial adjacent endplate bone loss. Prior posterior decompression at this level as well. IMPRESSION: Interval lumbosacral fusion revision with bilateral S1 screw replacement and L4 to pelvis posterolateral fusion. Popiel hardware is intact. Unchanged L5-S1 pseudoarthrosis. Persistent lucency along the S1 screw tracks. Thick wall rim enhancing collection along the posterior subacute soft tissues consistent recent surgery measuring 3.5 x 2.8 cm. The sterility of this collection is indeterminate by CT. Unchanged interbody arthrodesis from L1 through L4 and mild posterior element arthrodesis at L4-L5. Electronically Signed   By: Caprice Renshaw M.D.   On: 04/04/2021 13:43   DG Chest Port 1 View  Result Date: 04/04/2021 CLINICAL DATA:  Questionable sepsis EXAM: PORTABLE CHEST 1 VIEW COMPARISON:  Chest radiograph 03/11/2021 FINDINGS: The right IJ vascular catheter has been removed. The cardiomediastinal silhouette  is stable. Lung volumes are markedly diminished. There is no focal consolidation or pulmonary edema. There is no pleural effusion or pneumothorax. There is no acute osseous abnormality. IMPRESSION: Markedly low lung volumes. Otherwise, no radiographic evidence of acute cardiopulmonary process. Electronically Signed   By: Lesia Hausen M.D.   On: 04/04/2021 10:59    EKG: Independently reviewed.  Sinus tachycardia, no acute ST changes  Assessment/Plan Principal Problem:   Sepsis (HCC)  (please populate well all problems here in Problem List. (For example, if patient is on BP meds at home and you resume or decide to hold them, it is a problem that needs to be her. Same for CAD, COPD, HLD and so on)  Severe sepsis -Evidenced by fever, hypotension, source is a back subcutaneous abscess.  Along with endorgan damage of encephalopathy. -Continue vancomycin and Zosyn given the patient had diabetes and risk of Pseudomonas infection. -Surgery consulted. -N.p.o. -We will give another IV bolus of 1 L and then continue normal saline 150 mL/h x 1 day. -Check TSH and cortisol, admit patient to PCU.  Acute metabolic encephalopathy -Secondary to sepsis -CT head no acute findings -Reevaluated after sepsis treatment.  Subcutaneous abscess on the back -Antibiotics and neurosurgery consulted. T and L spine MRI ordered.  IDDM -N.p.o. for now, cut down Lantus dosage,  -Start sliding scale for now.  Diabetic neuropathy, anxiety/depression -Hold p.o. medication for today  Orthostatic hypotension -Resume midodrine once resuming p.o. intake  DVT prophylaxis: Lovenox Code Status: Full code Family Communication: Daughter over the phone Disposition Plan: Patient is sick with Rubel onset upper back abscess need neurosurgery intervention and sepsis, expect more than 2 midnight hospital stay. Consults called: Neurosurgery Admission status: PCU   Emeline General MD [facility] Pager [phone]  04/04/2021,  2:45 PM

## 2021-04-04 NOTE — Consult Note (Signed)
Neurosurgery Consultation  Reason for Consult: Post-op sepsis Referring Physician: Chipper Herb  CC: Confusion  HPI: This is a 70 y.o. woman, known to me from recent surgery. She now presents post-op with a picture concerning for sepsis. 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 She was confused, hypotensive, improved with fluids, on antibiotics already, unclear source, CXR neg, CT L-spine w/ post-op collection. She is encephalopathic but denies back pain or leg pain, which is an improvement from preop, denies drainage from the wound. No Stabler weakness, numbness, or parasthesias, no recent change in bowel or bladder function.    ROS: A 14 point ROS was performed and is negative except as noted in the HPI.   PMHx:  Past Medical History:  Diagnosis Date   Back pain    Obstructive sleep apnea    Type 2 diabetes mellitus with diabetic autonomic neuropathy, with long-term current use of insulin (HCC)    FamHx: History reviewed. No pertinent family history. SocHx:  reports that she quit smoking about 33 years ago. Her smoking use included cigarettes. She has never used smokeless tobacco. She reports that she does not currently use alcohol. She reports that she does not use drugs.  Exam: Vital signs in last 24 hours: Temp:  [97.5 F (36.4 C)-99.2 F (37.3 C)] 97.5 F (36.4 C) (12/30 1643) Pulse Rate:  [65-96] 74 (12/30 1643) Resp:  [12-25] 15 (12/30 1643) BP: (86-113)/(50-61) 102/61 (12/30 1643) SpO2:  [97 %-100 %] 100 % (12/30 1643) Weight:  [86.2 kg] 86.2 kg (12/30 1029) General: Somnolent, alert, cooperative, lying in bed in NAD Head: Normocephalic and atruamatic HEENT: Neck supple Pulmonary: breathing room air comfortably, no evidence of increased work of breathing Cardiac: RRR Abdomen: obese S NT ND Extremities: Warm and well perfused x4 Neuro: Somnolent, but PERRL and gaze symmetric and neutral, face grossly symmetric, strength  diffusely 4+ to 5/5 - difficult to tell based off effort, SILTx4 except bilateral stocking numbness (stable / chronic) Wound has some appropriate mild erythema, some granulation tissue, no concerning erythema or induration, +does have some right lateral hip warmth/erythema with some streaking that could be cellulitis  Assessment and Plan: 70 y.o. woman s/p revision of lumbar pseudoarthrosis. CT L-spine personally reviewed, which shows hardware in place / good position, post-op fluid collection of unclear sterility.   -wound actually appears like it's healing fairly well given her underlying, does not appear grossly infected. We should get an MRI to help evaluate for any osteomyelitis / epidural abscess. Given how well her wound is healing and her underlying multiple co-morbidities, if there is concern about abscess then percutaneous drainage by IR may be the best option to avoid re-opening her wound. Will see what the MRI shows -please call with any concerns or questions  [name], MD 04/04/21 7:47 PM [facility]

## 2021-04-04 NOTE — Progress Notes (Signed)
Pt admitted to rm 27 from ED. CHG wipe given. Initiated tele. VSS. Call bell within reach.   Lawson Radar, RN

## 2021-04-05 DIAGNOSIS — R6520 Severe sepsis without septic shock: Secondary | ICD-10-CM

## 2021-04-05 DIAGNOSIS — A419 Sepsis, unspecified organism: Secondary | ICD-10-CM | POA: Diagnosis not present

## 2021-04-05 DIAGNOSIS — G061 Intraspinal abscess and granuloma: Secondary | ICD-10-CM | POA: Insufficient documentation

## 2021-04-05 DIAGNOSIS — G9341 Metabolic encephalopathy: Secondary | ICD-10-CM

## 2021-04-05 DIAGNOSIS — E1140 Type 2 diabetes mellitus with diabetic neuropathy, unspecified: Secondary | ICD-10-CM

## 2021-04-05 LAB — CBC
HCT: 29.8 % — ABNORMAL LOW (ref 36.0–46.0)
Hemoglobin: 9.2 g/dL — ABNORMAL LOW (ref 12.0–15.0)
MCH: 27.8 pg (ref 26.0–34.0)
MCHC: 30.9 g/dL (ref 30.0–36.0)
MCV: 90 fL (ref 80.0–100.0)
Platelets: 110 10*3/uL — ABNORMAL LOW (ref 150–400)
RBC: 3.31 MIL/uL — ABNORMAL LOW (ref 3.87–5.11)
RDW: 15.3 % (ref 11.5–15.5)
WBC: 4.3 10*3/uL (ref 4.0–10.5)
nRBC: 0 % (ref 0.0–0.2)

## 2021-04-05 LAB — BASIC METABOLIC PANEL
Anion gap: 8 (ref 5–15)
BUN: 22 mg/dL (ref 8–23)
CO2: 23 mmol/L (ref 22–32)
Calcium: 7.9 mg/dL — ABNORMAL LOW (ref 8.9–10.3)
Chloride: 107 mmol/L (ref 98–111)
Creatinine, Ser: 1.07 mg/dL — ABNORMAL HIGH (ref 0.44–1.00)
GFR, Estimated: 56 mL/min — ABNORMAL LOW (ref >60)
Glucose, Bld: 105 mg/dL — ABNORMAL HIGH (ref 70–99)
Potassium: 3.4 mmol/L — ABNORMAL LOW (ref 3.5–5.1)
Sodium: 138 mmol/L (ref 135–145)

## 2021-04-05 LAB — GLUCOSE, CAPILLARY
Glucose-Capillary: 93 mg/dL (ref 70–99)
Glucose-Capillary: 95 mg/dL (ref 70–99)
Glucose-Capillary: 86 mg/dL (ref 70–99)
Glucose-Capillary: 39 mg/dL — CL (ref 70–99)
Glucose-Capillary: 77 mg/dL (ref 70–99)

## 2021-04-05 MED ORDER — POTASSIUM CHLORIDE CRYS ER 20 MEQ PO TBCR
40.0000 meq | EXTENDED_RELEASE_TABLET | Freq: Two times a day (BID) | ORAL | Status: AC
  Administered 2021-04-05 (×2): 40 meq via ORAL
  Filled 2021-04-05 (×2): qty 2

## 2021-04-05 MED ORDER — ATORVASTATIN CALCIUM 40 MG PO TABS
40.0000 mg | ORAL_TABLET | Freq: Every day | ORAL | Status: DC
  Administered 2021-04-05 – 2021-04-12 (×8): 40 mg via ORAL
  Filled 2021-04-05 (×8): qty 1

## 2021-04-05 MED ORDER — SODIUM CHLORIDE 0.9 % IV BOLUS
1000.0000 mL | Freq: Once | INTRAVENOUS | Status: AC
  Administered 2021-04-05: 1000 mL via INTRAVENOUS

## 2021-04-05 MED ORDER — POLYETHYLENE GLYCOL 3350 17 G PO PACK
17.0000 g | PACK | Freq: Every morning | ORAL | Status: DC
  Administered 2021-04-08 – 2021-04-10 (×2): 17 g via ORAL
  Filled 2021-04-05 (×5): qty 1

## 2021-04-05 MED ORDER — MIDODRINE HCL 5 MG PO TABS
30.0000 mg | ORAL_TABLET | Freq: Three times a day (TID) | ORAL | Status: DC
  Administered 2021-04-05 – 2021-04-12 (×22): 30 mg via ORAL
  Filled 2021-04-05 (×22): qty 6

## 2021-04-05 MED ORDER — OXYCODONE HCL 5 MG PO TABS
10.0000 mg | ORAL_TABLET | Freq: Three times a day (TID) | ORAL | Status: DC | PRN
  Administered 2021-04-06 – 2021-04-10 (×5): 10 mg via ORAL
  Filled 2021-04-05 (×6): qty 2

## 2021-04-05 MED ORDER — METOCLOPRAMIDE HCL 5 MG PO TABS
5.0000 mg | ORAL_TABLET | Freq: Three times a day (TID) | ORAL | Status: DC
  Administered 2021-04-05 – 2021-04-12 (×29): 5 mg via ORAL
  Filled 2021-04-05 (×30): qty 1

## 2021-04-05 MED ORDER — MORPHINE SULFATE ER 15 MG PO TBCR
15.0000 mg | EXTENDED_RELEASE_TABLET | Freq: Two times a day (BID) | ORAL | Status: DC
  Administered 2021-04-05 – 2021-04-12 (×15): 15 mg via ORAL
  Filled 2021-04-05 (×15): qty 1

## 2021-04-05 MED ORDER — PANTOPRAZOLE SODIUM 40 MG PO TBEC
40.0000 mg | DELAYED_RELEASE_TABLET | Freq: Two times a day (BID) | ORAL | Status: DC
  Administered 2021-04-05 – 2021-04-12 (×16): 40 mg via ORAL
  Filled 2021-04-05 (×16): qty 1

## 2021-04-05 MED ORDER — GABAPENTIN 300 MG PO CAPS
600.0000 mg | ORAL_CAPSULE | Freq: Three times a day (TID) | ORAL | Status: DC
  Administered 2021-04-05 – 2021-04-12 (×23): 600 mg via ORAL
  Filled 2021-04-05 (×23): qty 2

## 2021-04-05 MED ORDER — ALBUTEROL SULFATE (2.5 MG/3ML) 0.083% IN NEBU: 2.5000 mg | INHALATION_SOLUTION | Freq: Three times a day (TID) | RESPIRATORY_TRACT | Status: DC | PRN

## 2021-04-05 NOTE — Progress Notes (Deleted)
Pt arrived to 4E from 2H. VSS. Pt A&OX4. CHG bath given. Telemetry applied, CCMD called. Pt oriented to room and call light within reach.  Brooke Pace, RN

## 2021-04-05 NOTE — Progress Notes (Signed)
[facility] night cross cover note:  I was notified by RN that the patient, who is scheduled to undergo MRI this evening, has a history of claustrophobia and is requesting premedication with benzodiazepine.  Specifically, the patient is requesting diazepam as opposed to lorazepam.  As the patient is currently n.p.o., I ordered diazepam 2.5 mg IV x1 dose for premedication leading up to MRI evaluation.    Newton Pigg, DO Hospitalist

## 2021-04-05 NOTE — Progress Notes (Addendum)
[facility] PROGRESS NOTE    Progress Note  Tonya Baker  [id number] DOB: 1950/05/06 DOA: 04/04/2021 PCP: Garwin Brothers, MD     Brief Narrative:   Tonya Baker is an 70 y.o. 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 discharged on the ninth who started to have back pain the day prior to admission with a temperature of 102 in the ED was found to be hypotensive she was fluid resuscitated but remained confused CT scan of the abdomen pelvis showed Haslip 2 x 7 x 4 by 10 subcutaneous abscess in the midline back extending from T11-L3 with a CT of the C-spine that showed persistent lucency along the S1 screw tract, there was a thick rim enhancing collection along the posterior soup acute soft tissue consistent with recent surgery measuring 3 x 2 cm   Significant studies: MRI of the thoracic spine showed postsurgical changes with visualization of fluid collection along the posterior surgical incision MRI of the lumbar spine showed 2 contiguous loculated collection at the laminectomy to me defect along with lower midline incision   Antibiotics: 04/04/2021 IV vancomycin 04/04/2021 IV Zosyn  Microbiology data: 04/05/2019 blood culture: Negative till date  Procedures: None  Assessment/Plan:   Severe sepsis due to abscess of her lumbar spine: Fever hypertension and a subcutaneous abscess along with endorgan damage as she is encephalopathic. She was started empirically on IV vancomycin and cefepime and fluid resuscitated. Blood cultures have been sent. Neurosurgery has been consulted and the patient has been placed n.p.o. Recommended an MRI to evaluate for osteomyelitis and epidural abscess, if positive for an abscess IR might be her best option. Awaiting neurosurgery further recommendations.  Acute  metabolic encephalopathy: Secondary to sepsis, CT scan of the head showed no acute findings.  Subcutaneous abscess of her back: Neurosurgery has been consulted and awaiting further recommendations she is currently on IV antibiotics.  Insulin-dependent diabetes mellitus type 2: She is currently n.p.o. currently on long-acting insulin plus sliding scale blood glucose fairly controlled.  Diabetic neuropathy, anxiety/depression: Holding oral medications.  Orthostatic hypotension: Resume midodrine.  Diabetic neuropathy: Resume gabapentin.  Hypokalemia: Replete orally recheck in the morning.   DVT prophylaxis: lovenox Family Communication:none Status is: Inpatient  Remains inpatient appropriate because: Severe sepsis        Code Status:     Code Status Orders  (From admission, onward)           Start     Ordered   04/04/21 1427  Full code  Continuous        04/04/21 1428           Code Status History     Date Active Date Inactive Code Status Order ID Comments User Context   03/10/2021 1756 03/14/2021 2212 Full Code [id number]  Tonya Part, MD Inpatient   12/28/2020 2230 01/04/2021 0406 Full Code [id number]  Renee Pain, MD ED         IV Access:   Peripheral IV   Procedures and diagnostic studies:   CT HEAD WO CONTRAST (5MM)  Result Date: 04/04/2021 CLINICAL DATA:  Mental status change, unknown cause EXAM: CT HEAD WITHOUT CONTRAST TECHNIQUE: Contiguous axial images were obtained from the base of the skull through the vertex without intravenous contrast. COMPARISON:  Head CT 03/11/2021 FINDINGS: Brain: No evidence of acute intracranial hemorrhage or extra-axial collection.No evidence of  mass lesion/concern mass effect.The ventricles are normal in size. Vascular: No hyperdense vessel or unexpected calcification. Skull: Normal. Negative for fracture or focal lesion. Sinuses/Orbits: No acute finding. Other: None. IMPRESSION: No acute intracranial  abnormality. Electronically Signed   By: Maurine Simmering M.D.   On: 04/04/2021 13:26   MR THORACIC SPINE WO CONTRAST  Result Date: 04/04/2021 CLINICAL DATA:  Initial evaluation for acute back pain, fever. History of prior lumbar surgery. Evaluate for epidural abscess. EXAM: MRI THORACIC SPINE WITHOUT CONTRAST TECHNIQUE: Multiplanar, multisequence MR imaging of the thoracic spine was performed. No intravenous contrast was administered. COMPARISON:  Prior CT from earlier the same day. FINDINGS: Alignment: Straightening of the normal thoracic kyphosis. No listhesis. Vertebrae: Susceptibility artifact related to prior posterior fusion partially visualized extending from L1 inferiorly. Vertebral body height maintained without acute or chronic fracture. Bone marrow signal intensity within normal limits. Few scattered benign hemangiomata noted, most prominent of which present within the T7 and T12 vertebral bodies. No worrisome osseous lesions. No findings to suggest osteomyelitis discitis or septic arthritis. Cord: Normal signal and morphology. No epidural abscess or other collection. Paraspinal and other soft tissues: Postsurgical changes related to recent lumbar surgery partially visualized within the posterior soft tissues of the visualized upper back. Partially visualized collection along the surgical incision measures 2.5 x 3.7 cm (series 10, image 37). Remainder of the visualized paraspinal soft tissues otherwise unremarkable about the thoracic spine. Trace layering bilateral pleural effusions noted. Visualized visceral structures otherwise unremarkable. Disc levels: Coronary for age multilevel disc desiccation present throughout the thoracic spine. Minimal noncompressive disc bulging noted at T10-11 through T12-L1. Scattered thoracic facet hypertrophy noted throughout the mid and lower thoracic spine. No significant spinal stenosis. Mild to moderate bilateral foraminal narrowing noted at T7-8 through T9-10,  largely due to facet disease. No frank neural impingement. IMPRESSION: 1. No acute abnormality within the thoracic spine. 2. Postsurgical changes related to recent lumbar surgery, with partially visualized collection along the posterior surgical incision. While this finding may simply reflect a benign postoperative seroma, superimposed infection is difficult to exclude, and could be considered in the correct clinical setting. Correlation with history, laboratory values, and symptomatology recommended. 3. No other acute abnormality or evidence for infection elsewhere within the thoracic spine. 4. Mild for age multilevel thoracic spondylosis without significant stenosis or neural impingement. Electronically Signed   By: Jeannine Boga M.D.   On: 04/04/2021 22:55   MR LUMBAR SPINE WO CONTRAST  Result Date: 04/04/2021 CLINICAL DATA:  Initial evaluation for lower back pain, fever, history of recent lumbar surgery. Evaluate for epidural abscess. EXAM: MRI LUMBAR SPINE WITHOUT CONTRAST TECHNIQUE: Multiplanar, multisequence MR imaging of the lumbar spine was performed. No intravenous contrast was administered. COMPARISON:  Comparison made with prior CT from earlier the same day as well as previous MRI from 12/28/2020. FINDINGS: Segmentation: Standard. Lowest well-formed disc space labeled the L5-S1 level. Alignment: Mild levoscoliosis. Trace chronic retrolisthesis of L1 on L2 through L3 on L4, with additional trace retrolisthesis of T11 on T12, stable. Vertebrae: Susceptibility artifact from prior posterior fusion at L1 through the sacrum, with bilateral transpedicular screws in place at all levels. Interbody fusion in place at L4-5 and L5-S1. Hardware better evaluated on prior CT. Chronic height loss with wedging deformity of L3, stable. Vertebral body height otherwise maintained without acute or interval fracture. Visualized bone marrow signal intensity within normal limits. Benign hemangioma noted within the  T12 vertebral body. No worrisome osseous lesions. Previously seen reactive marrow edema  about the L5-S1 interspace is improved as compared to prior MRI. No other signal changes to suggest osteomyelitis discitis or septic arthritis. Conus medullaris and cauda equina: Conus extends to the T12-L1 level. Conus and cauda equina appear normal. No evidence for arachnoiditis. No epidural abscess or other collection. Paraspinal and other soft tissues: Postoperative changes from recent surgery present throughout the lower posterior paraspinous soft tissues. Residual collection seen extending along the lower midline incision measures approximately 2.6 x 3.6 x 8.8 cm (AP by transverse by craniocaudad). The inferior aspect of this collection demonstrate subfascial extension, communicating within additional loculated collection at the laminectomy defect extending from L2 through L5 (series 6, image 21). This second deeper loculated collection measures approximately 3.4 x 2.1 x 6.9 cm (transverse by AP by craniocaudad) (series 6, image 23). No significant inflammatory changes seen about these collections, which are favored to reflect benign postoperative seromas. Superimposed infection difficult to exclude, and could be considered in the correct clinical setting. Disc levels: L1-2: Trace retrolisthesis with degenerative intervertebral disc space narrowing, diffuse disc bulge, and disc desiccation. Prior posterior fusion. Residual mild facet hypertrophy. No spinal stenosis. Foramina appear patent. L2-3: Chronic retrolisthesis with advanced degenerative intervertebral disc space narrowing. Diffuse disc bulge with disc desiccation and reactive endplate spurring. Residual moderate facet hypertrophy. Prior posterior decompression with fusion. Thecal sac remains patent. No significant foraminal encroachment. L3-4: Trace chronic retrolisthesis. Advanced degenerative intervertebral disc space narrowing with disc desiccation and diffuse  disc bulge. Reactive endplate spurring. Prior posterior decompression with fusion. Residual moderate facet hypertrophy. Residual mild to moderate left lateral recess stenosis, stable (series 6, image 16). Thecal sac otherwise patent. Mild bilateral L3 foraminal narrowing, grossly stable. L4-5: Prior posterior and interbody fusion with posterior decompression. No residual spinal stenosis. Foramina remain patent. L5-S1: Prior posterior and interbody fusion with posterior decompression. No residual spinal stenosis. Foramina appear grossly patent. IMPRESSION: 1. Postoperative changes from recent surgery with two contiguous loculated collections at the laminectomy defect and along the lower midline incision as detailed above. Given overall appearance, findings favored to reflect benign postoperative seromas. Superimposed infection difficult to exclude, and could be considered in the correct clinical setting. 2. No other evidence for acute infection within the lumbar spine. No evidence for osteomyelitis discitis, septic arthritis, or epidural abscess. 3. Postoperative changes from prior posterior and interbody fusion at L1 through the sacrum as detailed above. Residual mild to moderate left lateral recess narrowing at L3-4. No other significant residual stenosis. Electronically Signed   By: Jeannine Boga M.D.   On: 04/04/2021 23:42   CT ABDOMEN PELVIS W CONTRAST  Result Date: 04/04/2021 CLINICAL DATA:  Fever and back pain. Altered mental status. Concern for sepsis. Recent lumbar surgery with drainage from the surgical site. EXAM: CT ABDOMEN AND PELVIS WITH CONTRAST TECHNIQUE: Multidetector CT imaging of the abdomen and pelvis was performed using the standard protocol following bolus administration of intravenous contrast. CONTRAST:  73mL OMNIPAQUE IOHEXOL 350 MG/ML SOLN COMPARISON:  None. FINDINGS: Lower chest: No acute abnormality. Hepatobiliary: No focal liver abnormality is seen. Status post  cholecystectomy. No biliary dilatation. Pancreas: Moderate atrophy. No ductal dilatation or surrounding inflammatory changes. Spleen: Mildly enlarged.  No focal abnormality. Adrenals/Urinary Tract: Unchanged homogeneously enhancing 1.1 cm right adrenal nodule, indeterminate on prior CTs. The left adrenal gland is unremarkable. Kidneys are normal without mass, calculi, or hydronephrosis. Trace focus of air within the bladder likely related to recent instrumentation. The bladder is otherwise unremarkable. Stomach/Bowel: Stomach is within normal limits. Appendix appears  normal. No evidence of bowel wall thickening, distention, or inflammatory changes. Vascular/Lymphatic: Aortoiliac atherosclerotic vascular disease. Prominent subcentimeter right inguinal and external iliac lymph nodes are likely reactive. Reproductive: Uterus and bilateral adnexa are unremarkable. Other: No free fluid or pneumoperitoneum. Musculoskeletal: Prior L1-S1 fusion with interval revision at L5-S1 and [place] extension of the fusion to the pelvis. Unchanged lucency around the L5-S1 interbody graft with adjacent endplate sclerosis. Continued lucency around the S1 screws. No progressive bony destruction. Similar scarring in the posterior midline lumbar soft tissues with Goodin 2.7 x 4.0 x 10.6 cm thick-walled rim enhancing gas and fluid collection in the midline extending from T11 to L3. IMPRESSION: 1. [place] 2.7 x 4.0 x 10.6 cm subcutaneous abscess in the midline back extending from T11 to L3. 2. Prior L1-S1 fusion with interval revision at L5-S1 and Delfino extension of the fusion to the pelvis. Unchanged L5-S1 pseudoarthrosis. 3. Unchanged 1.1 cm indeterminate right adrenal nodule. This is probably benign. Consider follow-up adrenal protocol CT in 12 months. This recommendation follows ACR consensus guidelines: Management of Incidental Adrenal Masses: A White Paper of the ACR Incidental Findings Committee. J Am Coll Radiol 2017;14:1038-1044. 4. Mild  splenomegaly. 5. Aortic Atherosclerosis (ICD10-I70.0). Electronically Signed   By: Obie Dredge M.D.   On: 04/04/2021 13:44   CT L-SPINE NO CHARGE  Result Date: 04/04/2021 CLINICAL DATA:  Back pain EXAM: CT LUMBAR SPINE WITHOUT CONTRAST TECHNIQUE: Multidetector CT imaging of the lumbar spine was performed without intravenous contrast administration. Multiplanar CT image reconstructions were also generated. COMPARISON:  None. FINDINGS: Segmentation: Normal Alignment: Unchanged alignment. Vertebrae: Stable vertebral body heights with most notable compression deformities at L2, L3, and L4. No evidence of progressive height loss. Paraspinal and other soft tissues: There is a thick-walled rim enhancing collection with punctate foci of gas along the posterior soft tissues [place] since the prior CT consistent with interval surgery. This measures up to 3.5 x 2.8 cm (series 4, image 15). Disc levels: Unchanged chronic postoperative changes from L1 through L4. Solid interbody arthrodesis from L1 through L4. L4-L5: Posterior decompression, anterior posterior fusion stable in comparison to prior exam. There is a single right-sided L4 pedicle screw. Unchanged degree of developing interbody arthrodesis with mild solid bridging posteriorly. L5-S1: Interval spinal fusion revision, with replacement of the bilateral S1 screws and a Pietrzak fusion through the pelvis. [place] hardware is intact. There is persistent mild lucency along the S1 screw tracks. Stable positioning of the interbody implant despite this substantial adjacent endplate bone loss. Prior posterior decompression at this level as well. IMPRESSION: Interval lumbosacral fusion revision with bilateral S1 screw replacement and L4 to pelvis posterolateral fusion. Siegfried hardware is intact. Unchanged L5-S1 pseudoarthrosis. Persistent lucency along the S1 screw tracks. Thick wall rim enhancing collection along the posterior subacute soft tissues consistent recent surgery measuring  3.5 x 2.8 cm. The sterility of this collection is indeterminate by CT. Unchanged interbody arthrodesis from L1 through L4 and mild posterior element arthrodesis at L4-L5. Electronically Signed   By: Caprice Renshaw M.D.   On: 04/04/2021 13:43   DG Chest Port 1 View  Result Date: 04/04/2021 CLINICAL DATA:  Questionable sepsis EXAM: PORTABLE CHEST 1 VIEW COMPARISON:  Chest radiograph 03/11/2021 FINDINGS: The right IJ vascular catheter has been removed. The cardiomediastinal silhouette is stable. Lung volumes are markedly diminished. There is no focal consolidation or pulmonary edema. There is no pleural effusion or pneumothorax. There is no acute osseous abnormality. IMPRESSION: Markedly low lung volumes. Otherwise, no radiographic evidence of  acute cardiopulmonary process. Electronically Signed   By: Valetta Mole M.D.   On: 04/04/2021 10:59     Medical Consultants:   None.   Subjective:    Ava Fradkin relates her pain is unchanged.  Objective:    Vitals:   04/05/21 0156 04/05/21 0337 04/05/21 0513 04/05/21 0750  BP: (!) 91/58 (!) 76/51 (!) 81/50 (!) 99/54  Pulse: 73 73 70 70  Resp: 17 12 16 19   Temp:  97.8 F (36.6 C)  98 F (36.7 C)  TempSrc:  Oral  Oral  SpO2: 100% 99% 99% 100%  Weight:      Height:       SpO2: 100 % O2 Flow Rate (L/min): 2 L/min  No intake or output data in the 24 hours ending 04/05/21 0828 Filed Weights   04/04/21 1029  Weight: 86.2 kg    Exam: General exam: In no acute distress. Respiratory system: Good air movement and clear to auscultation. Cardiovascular system: S1 & S2 heard, RRR. No JVD. Gastrointestinal system: Abdomen is nondistended, soft and nontender.  Extremities: No pedal edema. Skin: No rashes, lesions or ulcers Psychiatry: Judgement and insight appear normal. Mood & affect appropriate.    Data Reviewed:    Labs: Basic Metabolic Panel: Recent Labs  Lab 04/04/21 1040 04/04/21 1331 04/05/21 0333  NA 136 138 138  K 3.6 3.6 3.4*   CL 102  --  107  CO2 25  --  23  GLUCOSE 159*  --  105*  BUN 27*  --  22  CREATININE 1.46*  --  1.07*  CALCIUM 8.7*  --  7.9*   GFR Estimated Creatinine Clearance: 52.7 mL/min (A) (by C-G formula based on SCr of 1.07 mg/dL (H)). Liver Function Tests: Recent Labs  Lab 04/04/21 1040  AST 21  ALT 11  ALKPHOS 185*  BILITOT 0.7  PROT 6.9  ALBUMIN 2.8*   No results for input(s): LIPASE, AMYLASE in the last 168 hours. Recent Labs  Lab 04/04/21 1305  AMMONIA <10   Coagulation profile Recent Labs  Lab 04/04/21 1040  INR 1.1   COVID-19 Labs  No results for input(s): DDIMER, FERRITIN, LDH, CRP in the last 72 hours.  Lab Results  Component Value Date   SARSCOV2NAA NEGATIVE 04/04/2021   SARSCOV2NAA NEGATIVE 03/13/2021   [place] NEGATIVE 03/10/2021   [place] NEGATIVE 01/02/2021    CBC: Recent Labs  Lab 04/04/21 1040 04/04/21 1331 04/05/21 0333  WBC 11.2*  --  4.3  NEUTROABS 9.6*  --   --   HGB 11.1* 10.9* 9.2*  HCT 35.4* 32.0* 29.8*  MCV 88.5  --  90.0  PLT 161  --  110*   Cardiac Enzymes: No results for input(s): CKTOTAL, CKMB, CKMBINDEX, TROPONINI in the last 168 hours. BNP (last 3 results) No results for input(s): PROBNP in the last 8760 hours. CBG: Recent Labs  Lab 04/04/21 1417 04/04/21 1704 04/05/21 0700  GLUCAP 174* 142* 93   D-Dimer: No results for input(s): DDIMER in the last 72 hours. Hgb A1c: No results for input(s): HGBA1C in the last 72 hours. Lipid Profile: No results for input(s): CHOL, HDL, LDLCALC, TRIG, CHOLHDL, LDLDIRECT in the last 72 hours. Thyroid function studies: Recent Labs    04/04/21 1040  TSH 0.884   Anemia work up: No results for input(s): VITAMINB12, FOLATE, FERRITIN, TIBC, IRON, RETICCTPCT in the last 72 hours. Sepsis Labs: Recent Labs  Lab 04/04/21 1040 04/04/21 1814 04/05/21 0333  WBC 11.2*  --  4.3  LATICACIDVEN 2.0* 1.8  --    Microbiology Recent Results (from the past 240 hour(s))  Resp Panel  by RT-PCR (Flu A&B, Covid) Nasopharyngeal Swab     Status: None   Collection Time: 04/04/21 11:00 AM   Specimen: Nasopharyngeal Swab; Nasopharyngeal(NP) swabs in vial transport medium  Result Value Ref Range Status   SARS Coronavirus 2 by RT PCR NEGATIVE NEGATIVE Final    Comment: (NOTE) SARS-CoV-2 target nucleic acids are NOT DETECTED.  The SARS-CoV-2 RNA is generally detectable in upper respiratory specimens during the acute phase of infection. The lowest concentration of SARS-CoV-2 viral copies this assay can detect is 138 copies/mL. A negative result does not preclude SARS-Cov-2 infection and should not be used as the sole basis for treatment or other patient management decisions. A negative result may occur with  improper specimen collection/handling, submission of specimen other than nasopharyngeal swab, presence of viral mutation(s) within the areas targeted by this assay, and inadequate number of viral copies(<138 copies/mL). A negative result must be combined with clinical observations, patient history, and epidemiological information. The expected result is Negative.  Fact Sheet for Patients:  EntrepreneurPulse.com.au  Fact Sheet for Healthcare Providers:  IncredibleEmployment.be  This test is no t yet approved or cleared by the [place] FDA and  has been authorized for detection and/or diagnosis of SARS-CoV-2 by FDA under an Emergency Use Authorization (EUA). This EUA will remain  in effect (meaning this test can be used) for the duration of the COVID-19 declaration under Section 564(b)(1) of the Act, 21 U.S.C.section 360bbb-3(b)(1), unless the authorization is terminated  or revoked sooner.       Influenza A by PCR NEGATIVE NEGATIVE Final   Influenza B by PCR NEGATIVE NEGATIVE Final    Comment: (NOTE) The Xpert Xpress SARS-CoV-2/FLU/RSV plus assay is intended as an aid in the diagnosis of influenza from Nasopharyngeal swab  specimens and should not be used as a sole basis for treatment. Nasal washings and aspirates are unacceptable for Xpert Xpress SARS-CoV-2/FLU/RSV testing.  Fact Sheet for Patients: EntrepreneurPulse.com.au  Fact Sheet for Healthcare Providers: IncredibleEmployment.be  This test is not yet approved or cleared by the [place] FDA and has been authorized for detection and/or diagnosis of SARS-CoV-2 by FDA under an Emergency Use Authorization (EUA). This EUA will remain in effect (meaning this test can be used) for the duration of the COVID-19 declaration under Section 564(b)(1) of the Act, 21 U.S.C. section 360bbb-3(b)(1), unless the authorization is terminated or revoked.  Performed at [facility], [place] [address], [place], [place] [address]      Medications:    aspirin  81 mg Oral q morning   enoxaparin (LOVENOX) injection  40 mg Subcutaneous Q24H   insulin aspart  0-15 Units Subcutaneous TID WC   insulin glargine-yfgn  10 Units Subcutaneous QHS   pantoprazole (PROTONIX) IV  40 mg Intravenous Q24H   QUEtiapine  25 mg Oral QHS   sertraline  100 mg Oral q morning   temazepam  30 mg Oral QHS   Continuous Infusions:  sodium chloride 150 mL/hr at 04/04/21 1806   piperacillin-tazobactam (ZOSYN)  IV 3.375 g (04/05/21 0332)   [START ON 04/06/2021] vancomycin        LOS: 1 day   Charlynne Cousins  [facility]  04/05/2021, 8:28 AM

## 2021-04-05 NOTE — Progress Notes (Signed)
[facility] night cross cover note:  I was notified by RN of patient's BP of 76/51.   Per my chart review, this is relative to systolic blood pressures in the 90s since 10 AM on 04/04/2021 in this patient who is admitted for sepsis due to subcutaneous abscess following recent back surgery, on broad-spectrum IV antibiotics via IV vancomycin and Zosyn, with neurosurgery following.   Patient asleep at this time, and easily arousable, without any [place] complaints at the present time.  Currently on normal saline at 150 cc/h.  I ordered a 1 L normal saline bolus, following which systolic blood pressure has improved into the 80s mmHg. following this bolus, patient's oxygen saturations remained 100% on room air, without any evidence of respiratory distress. subsequently, I ordered an additional 1 L normal saline bolus.     Newton Pigg, DO Hospitalist

## 2021-04-06 DIAGNOSIS — A419 Sepsis, unspecified organism: Secondary | ICD-10-CM | POA: Diagnosis not present

## 2021-04-06 DIAGNOSIS — R6520 Severe sepsis without septic shock: Secondary | ICD-10-CM | POA: Diagnosis not present

## 2021-04-06 LAB — CBC WITH DIFFERENTIAL/PLATELET
Abs Immature Granulocytes: 0.03 10*3/uL (ref 0.00–0.07)
Basophils Absolute: 0.1 10*3/uL (ref 0.0–0.1)
Basophils Relative: 1 %
Eosinophils Absolute: 0.6 10*3/uL — ABNORMAL HIGH (ref 0.0–0.5)
Eosinophils Relative: 11 %
HCT: 37.9 % (ref 36.0–46.0)
Hemoglobin: 11.2 g/dL — ABNORMAL LOW (ref 12.0–15.0)
Immature Granulocytes: 1 %
Lymphocytes Relative: 28 %
Lymphs Abs: 1.4 10*3/uL (ref 0.7–4.0)
MCH: 27.1 pg (ref 26.0–34.0)
MCHC: 29.6 g/dL — ABNORMAL LOW (ref 30.0–36.0)
MCV: 91.8 fL (ref 80.0–100.0)
Monocytes Absolute: 0.4 10*3/uL (ref 0.1–1.0)
Monocytes Relative: 7 %
Neutro Abs: 2.7 10*3/uL (ref 1.7–7.7)
Neutrophils Relative %: 52 %
Platelets: 145 10*3/uL — ABNORMAL LOW (ref 150–400)
RBC: 4.13 MIL/uL (ref 3.87–5.11)
RDW: 15.5 % (ref 11.5–15.5)
WBC: 5 10*3/uL (ref 4.0–10.5)
nRBC: 0 % (ref 0.0–0.2)

## 2021-04-06 LAB — GLUCOSE, CAPILLARY
Glucose-Capillary: 77 mg/dL (ref 70–99)
Glucose-Capillary: 71 mg/dL (ref 70–99)
Glucose-Capillary: 80 mg/dL (ref 70–99)
Glucose-Capillary: 122 mg/dL — ABNORMAL HIGH (ref 70–99)

## 2021-04-06 LAB — RENAL FUNCTION PANEL
Albumin: 2.5 g/dL — ABNORMAL LOW (ref 3.5–5.0)
Anion gap: 6 (ref 5–15)
BUN: 13 mg/dL (ref 8–23)
CO2: 26 mmol/L (ref 22–32)
Calcium: 8.7 mg/dL — ABNORMAL LOW (ref 8.9–10.3)
Chloride: 108 mmol/L (ref 98–111)
Creatinine, Ser: 1.07 mg/dL — ABNORMAL HIGH (ref 0.44–1.00)
GFR, Estimated: 56 mL/min — ABNORMAL LOW (ref >60)
Glucose, Bld: 84 mg/dL (ref 70–99)
Phosphorus: 4 mg/dL (ref 2.5–4.6)
Potassium: 4.4 mmol/L (ref 3.5–5.1)
Sodium: 140 mmol/L (ref 135–145)

## 2021-04-06 MED ORDER — INSULIN ASPART 100 UNIT/ML IJ SOLN
4.0000 [IU] | Freq: Three times a day (TID) | INTRAMUSCULAR | Status: DC
  Administered 2021-04-07 – 2021-04-12 (×16): 4 [IU] via SUBCUTANEOUS

## 2021-04-06 MED ORDER — INSULIN ASPART 100 UNIT/ML IJ SOLN
0.0000 [IU] | Freq: Three times a day (TID) | INTRAMUSCULAR | Status: DC
  Administered 2021-04-07: 3 [IU] via SUBCUTANEOUS
  Administered 2021-04-08: 2 [IU] via SUBCUTANEOUS
  Administered 2021-04-08 – 2021-04-09 (×4): 3 [IU] via SUBCUTANEOUS
  Administered 2021-04-09: 2 [IU] via SUBCUTANEOUS
  Administered 2021-04-10: 3 [IU] via SUBCUTANEOUS
  Administered 2021-04-10: 2 [IU] via SUBCUTANEOUS
  Administered 2021-04-10: 3 [IU] via SUBCUTANEOUS
  Administered 2021-04-11: 2 [IU] via SUBCUTANEOUS
  Administered 2021-04-11: 3 [IU] via SUBCUTANEOUS
  Administered 2021-04-11: 5 [IU] via SUBCUTANEOUS
  Administered 2021-04-12: 3 [IU] via SUBCUTANEOUS
  Administered 2021-04-12 (×2): 2 [IU] via SUBCUTANEOUS

## 2021-04-06 MED ORDER — INSULIN ASPART 100 UNIT/ML IJ SOLN: 0.0000 [IU] | Freq: Every day | INTRAMUSCULAR | Status: DC

## 2021-04-06 MED ORDER — VANCOMYCIN HCL 750 MG/150ML IV SOLN
750.0000 mg | INTRAVENOUS | Status: DC
  Administered 2021-04-07 – 2021-04-08 (×2): 750 mg via INTRAVENOUS
  Filled 2021-04-06 (×2): qty 150

## 2021-04-06 NOTE — Consult Note (Signed)
Chief Complaint: Patient was seen in consultation today for  Chief Complaint  Patient presents with   Blood Infection    Surgical site neck   Fever    Referring Physician(s): Dr. Aileen Fass  Supervising Physician: Markus Daft  Patient Status: [facility] - In-pt  History of Present Illness: Tonya Baker is a 71 y.o. female with a medical history significant for DM2, obstructive sleep apnea and back pain with prior discitis and L1-S1 fusion. She underwent revision of L5-S1 pseudoarthrosis with revision of hardware and extension into the pelvis on 03/10/21 with Dr. Zada Finders. She presented to the ED 04/04/21 with back pain and was found to have paraspinal fluid collections.   MR Lumbar Spine w/out contrast 04/04/21 Paraspinal and other soft tissues: Postoperative changes from recent surgery present throughout the lower posterior paraspinous soft tissues. Residual collection seen extending along the lower midline incision measures approximately 2.6 x 3.6 x 8.8 cm (AP by transverse by craniocaudad). The inferior aspect of this collection demonstrate subfascial extension, communicating within additional loculated collection at the laminectomy defect extending from L2 through L5 (series 6, image 21). This second deeper loculated collection measures approximately 3.4 x 2.1 x 6.9 cm (transverse by AP by craniocaudad) (series 6, image 23). No significant inflammatory changes seen about these collections, which are favored to reflect benign postoperative seromas. Superimposed infection difficult to exclude, and could be considered in the correct clinical setting.  Interventional Radiology has been asked to evaluate this patient for an image-guided paraspinal fluid collection aspiration with possible drain placement. Imaging reviewed and procedure approved by Dr. Anselm Pancoast.  Past Medical History:  Diagnosis Date   Back pain    Obstructive sleep apnea    Type 2 diabetes mellitus with diabetic autonomic  neuropathy, with long-term current use of insulin ([place])     Past Surgical History:  Procedure Laterality Date   APPLICATION OF ROBOTIC ASSISTANCE FOR SPINAL PROCEDURE N/A 03/10/2021   Procedure: APPLICATION OF ROBOTIC ASSISTANCE FOR SPINAL PROCEDURE;  Surgeon: Judith Part, MD;  Location: [place];  Service: Neurosurgery;  Laterality: N/A;   BACK SURGERY N/A 06/2020   BREAST SURGERY      Allergies: Duloxetine hcl, Cortisone, Hydrocortisone, and Latex  Medications: Prior to Admission medications   Medication Sig Start Date End Date Taking? Authorizing Provider  albuterol (PROVENTIL) (2.5 MG/3ML) 0.083% nebulizer solution Take 2.5 mg by nebulization every 8 (eight) hours as needed (congestion).   Yes [provider]  aspirin 81 MG chewable tablet Chew 81 mg by mouth every morning.   Yes [provider]  atorvastatin (LIPITOR) 40 MG tablet Take 40 mg by mouth at bedtime. 2100   Yes [provider]  bisacodyl (DULCOLAX) 10 MG suppository Place 10 mg rectally daily as needed (constipation).   Yes [provider]  cholecalciferol (VITAMIN D3) 25 MCG (1000 UNIT) tablet Take 1,000 Units by mouth every morning.   Yes [provider]  cyclobenzaprine (FLEXERIL) 10 MG tablet Take 10 mg by mouth 3 (three) times daily.   Yes [provider]  docusate sodium (COLACE) 100 MG capsule Take 100 mg by mouth every morning.   Yes [provider]  doxycycline (VIBRAMYCIN) 100 MG capsule Take 100 mg by mouth every 12 (twelve) hours. 2100   Yes [provider]  Dulaglutide (TRULICITY) [id number] SOPN Inject 0.75 mg into the skin every Monday.   Yes [provider]  ferrous sulfate 325 (65 FE) MG tablet Take 325 mg by mouth every morning.  Yes [provider]  furosemide (LASIX) 40 MG tablet Take 40 mg by mouth every morning.   Yes [provider]  gabapentin (NEURONTIN) 300 MG capsule Take 600 mg by mouth 3  (three) times daily.   Yes [provider]  Insulin Glargine (BASAGLAR KWIKPEN) 100 UNIT/ML Inject 60 Units into the skin daily.   Yes [provider]  insulin glargine (LANTUS) 100 UNIT/ML injection Inject 60 Units into the skin daily. Takes Lantus between 0800- 0900.   Yes [provider]  magnesium citrate SOLN Take 30 mLs by mouth every 12 (twelve) hours as needed (constipation).   Yes [provider]  metoCLOPramide (REGLAN) 5 MG tablet Take 5 mg by mouth 4 (four) times daily -  before meals and at bedtime.   Yes [provider]  midodrine (PROAMATINE) 10 MG tablet Take 3 tablets (30 mg total) by mouth 3 (three) times daily. Patient taking differently: Take 20 mg by mouth 3 (three) times daily. 01/02/21  Yes Dorcas CarrowGhimire, Kuber, MD  morphine (MS CONTIN) 15 MG 12 hr tablet Take 15 mg by mouth every 12 (twelve) hours.   Yes [provider]  NOVOLOG FLEXPEN 100 UNIT/ML FlexPen Inject 2-10 Units into the skin See admin instructions. Inject as per sliding scale: 151-200 = 2   201-250 = 4   251-300 = 6   301-350 = 8   351-400 = 10. 03/10/21  Yes [provider]  Oxycodone HCl 10 MG TABS Take 10 mg by mouth every 8 (eight) hours as needed (leg pain).   Yes [provider]  pantoprazole (PROTONIX) 40 MG tablet Take 40 mg by mouth 2 (two) times daily.   Yes [provider]  polyethylene glycol (MIRALAX / GLYCOLAX) 17 g packet Take 17 g by mouth every morning.   Yes [provider]  potassium chloride SA (KLOR-CON) 20 MEQ tablet Take 20 mEq by mouth every morning.   Yes [provider]  QUEtiapine (SEROQUEL) 25 MG tablet Take 25 mg by mouth at bedtime. 2100   Yes [provider]  sertraline (ZOLOFT) 100 MG tablet Take 100 mg by mouth every morning.   Yes [provider]  Skin Protectants, Misc. (EUCERIN) cream Apply 1 application topically daily.   Yes [provider]  temazepam  (RESTORIL) 30 MG capsule Take 30 mg by mouth at bedtime.   Yes [provider]  acetaminophen (TYLENOL) 325 MG tablet Take 650 mg by mouth every 6 (six) hours as needed (pain).    [provider]  insulin lispro (HUMALOG) 100 UNIT/ML injection Inject 2-10 Units into the skin 4 (four) times daily -  before meals and at bedtime. Sliding Scale:   CBG 151-200 2 units, 201-250 4 units, 251-300 6 units, 301-350 8 units, 351-400 10 units,    [provider]     History reviewed. No pertinent family history.  Social History   Socioeconomic History   Marital status: Single    Spouse name: Not on file   Number of children: Not on file   Years of education: Not on file   Highest education level: Not on file  Occupational History   Not on file  Tobacco Use   Smoking status: Former    Types: Cigarettes    Quit date: 61990    Years since quitting: 33.0   Smokeless tobacco: Never  Substance and Sexual Activity   Alcohol use: Not Currently    Comment: socially   Drug  use: Never   Sexual activity: Not Currently  Other Topics Concern   Not on file  Social History Narrative   Not on file   Social Determinants of Health   Financial Resource Strain: Not on file  Food Insecurity: Not on file  Transportation Needs: Not on file  Physical Activity: Not on file  Stress: Not on file  Social Connections: Not on file    Review of Systems: A 12 point ROS discussed and pertinent positives are indicated in the HPI above.  All other systems are negative.  Review of Systems  Constitutional:  Positive for fatigue. Negative for appetite change.  Respiratory:  Negative for cough and shortness of breath.   Gastrointestinal:  Negative for diarrhea, nausea and vomiting.  Musculoskeletal:  Positive for back pain.  Neurological:  Positive for headaches.   Vital Signs: BP (!) 144/67 (BP Location: Left Arm)    Pulse 66    Temp (!) 97.3 F (36.3 C) (Oral)    Resp 14    Ht 5\' 4"   (1.626 m)    Wt 190 lb (86.2 kg)    SpO2 100%    BMI 32.61 kg/m   Physical Exam Constitutional:      General: She is not in acute distress.    Comments: She is on contact precautions for ESBL   HENT:     Mouth/Throat:     Mouth: Mucous membranes are moist.     Pharynx: Oropharynx is clear.  Pulmonary:     Effort: Pulmonary effort is normal.  Neurological:     Mental Status: She is alert and oriented to person, place, and time.    Imaging: DG Lumbar Spine 2-3 Views  Result Date: 03/10/2021 CLINICAL DATA:  Revision L5-S1. EXAM: LUMBAR SPINE - 2-3 VIEW COMPARISON:  CT lumbar spine 03/07/2021. FINDINGS: Intraoperative lumbosacral spine. Three low resolution intraoperative spot views of the lumbosacral spine were obtained. No fracture visible on the limited views. L1-S1 posterior fusion rods are present with screws traversing sacroiliac joints. Total fluoroscopy time: 33 seconds Total radiation dose: 12.74 micro Gy IMPRESSION: L5-S1 revision as above. Electronically Signed   By: Ronney Asters M.D.   On: 03/10/2021 15:05   CT HEAD WO CONTRAST (5MM)  Result Date: 04/04/2021 CLINICAL DATA:  Mental status change, unknown cause EXAM: CT HEAD WITHOUT CONTRAST TECHNIQUE: Contiguous axial images were obtained from the base of the skull through the vertex without intravenous contrast. COMPARISON:  Head CT 03/11/2021 FINDINGS: Brain: No evidence of acute intracranial hemorrhage or extra-axial collection.No evidence of mass lesion/concern mass effect.The ventricles are normal in size. Vascular: No hyperdense vessel or unexpected calcification. Skull: Normal. Negative for fracture or focal lesion. Sinuses/Orbits: No acute finding. Other: None. IMPRESSION: No acute intracranial abnormality. Electronically Signed   By: Maurine Simmering M.D.   On: 04/04/2021 13:26   CT HEAD WO CONTRAST (5MM)  Result Date: 03/11/2021 CLINICAL DATA:  Encephalopathy EXAM: CT HEAD WITHOUT CONTRAST TECHNIQUE: Contiguous axial images  were obtained from the base of the skull through the vertex without intravenous contrast. COMPARISON:  01/23/2021 FINDINGS: Brain: There is no mass, hemorrhage or extra-axial collection. The size and configuration of the ventricles and extra-axial CSF spaces are normal. The brain parenchyma is normal, without acute or chronic infarction. Vascular: No abnormal hyperdensity of the major intracranial arteries or dural venous sinuses. No intracranial atherosclerosis. Skull: The visualized skull base, calvarium and extracranial soft tissues are normal. Sinuses/Orbits: No fluid levels or advanced mucosal thickening of the visualized  paranasal sinuses. No mastoid or middle ear effusion. The orbits are normal. IMPRESSION: Normal head CT. Electronically Signed   By: Ulyses Jarred M.D.   On: 03/11/2021 19:37   CT LUMBAR SPINE WO CONTRAST  Result Date: 03/08/2021 CLINICAL DATA:  71 year old female preoperative study for stereotactic surgical planning. Prior surgery with pseudoarthrosis. EXAM: CT LUMBAR SPINE WITHOUT CONTRAST TECHNIQUE: Multidetector CT imaging of the lumbar spine was performed without intravenous contrast administration. Multiplanar CT image reconstructions were also generated. COMPARISON:  Lumbar spine CT 01/23/2021, MRI 12/28/20. FINDINGS: Segmentation: Normal, the same numbering system used on prior exams. Alignment: Stable vertebral height and alignment, including through the lumbosacral junction. Vertebrae: Diffuse osteopenia. Stable visualized osseous structures. Visible sacral ala and SI joints remain intact. Intact visible pelvis. Paraspinal and other soft tissues: Negative visible lung bases. Cholecystectomy. Mildly tortuous abdominal aorta. Mild Aortoiliac calcified atherosclerosis. Otherwise negative visible noncontrast abdominal and pelvic viscera. Disc levels: Visible lower thoracic levels are stable. Chronic postoperative changes L1 through L5 are stable. Solid appearing interbody arthrodesis  L1 through L4. L4-L5: Sequelae of posterior decompression, anterior and posterior fusion appears stable since October. Right unilateral L4 pedicle screw. Interbody implant with interbody calcification suggesting developing interbody arthrodesis. And there does appear to be solid posterior element arthrodesis as before. L5-S1: Stable L5 pedicle screws, that on the left extending to the subchondral surface of the superior L5 endplate. Stable positioning of interbody implant despite abundant adjacent endplate subsidence. Loosening of the bilateral S1 pedicle screws has not significantly changed. Stable prior decompression with absent arthrodesis. IMPRESSION: 1. Study for stereotactic surgical planning. 2. Stable L5-S1 pseudoarthrosis and hardware loosening since October. 3. Stable CT appearance of the lumbar spine elsewhere: Osteopenia with solid appearing interbody arthrodesis from L1 through L4, and scant but solid-appearing posterior element arthrodesis at L4-L5. Electronically Signed   By: Genevie Ann M.D.   On: 03/08/2021 11:22   MR THORACIC SPINE WO CONTRAST  Result Date: 04/04/2021 CLINICAL DATA:  Initial evaluation for acute back pain, fever. History of prior lumbar surgery. Evaluate for epidural abscess. EXAM: MRI THORACIC SPINE WITHOUT CONTRAST TECHNIQUE: Multiplanar, multisequence MR imaging of the thoracic spine was performed. No intravenous contrast was administered. COMPARISON:  Prior CT from earlier the same day. FINDINGS: Alignment: Straightening of the normal thoracic kyphosis. No listhesis. Vertebrae: Susceptibility artifact related to prior posterior fusion partially visualized extending from L1 inferiorly. Vertebral body height maintained without acute or chronic fracture. Bone marrow signal intensity within normal limits. Few scattered benign hemangiomata noted, most prominent of which present within the T7 and T12 vertebral bodies. No worrisome osseous lesions. No findings to suggest  osteomyelitis discitis or septic arthritis. Cord: Normal signal and morphology. No epidural abscess or other collection. Paraspinal and other soft tissues: Postsurgical changes related to recent lumbar surgery partially visualized within the posterior soft tissues of the visualized upper back. Partially visualized collection along the surgical incision measures 2.5 x 3.7 cm (series 10, image 37). Remainder of the visualized paraspinal soft tissues otherwise unremarkable about the thoracic spine. Trace layering bilateral pleural effusions noted. Visualized visceral structures otherwise unremarkable. Disc levels: Coronary for age multilevel disc desiccation present throughout the thoracic spine. Minimal noncompressive disc bulging noted at T10-11 through T12-L1. Scattered thoracic facet hypertrophy noted throughout the mid and lower thoracic spine. No significant spinal stenosis. Mild to moderate bilateral foraminal narrowing noted at T7-8 through T9-10, largely due to facet disease. No frank neural impingement. IMPRESSION: 1. No acute abnormality within the thoracic spine. 2. Postsurgical  changes related to recent lumbar surgery, with partially visualized collection along the posterior surgical incision. While this finding may simply reflect a benign postoperative seroma, superimposed infection is difficult to exclude, and could be considered in the correct clinical setting. Correlation with history, laboratory values, and symptomatology recommended. 3. No other acute abnormality or evidence for infection elsewhere within the thoracic spine. 4. Mild for age multilevel thoracic spondylosis without significant stenosis or neural impingement. Electronically Signed   By: Jeannine Boga M.D.   On: 04/04/2021 22:55   MR LUMBAR SPINE WO CONTRAST  Result Date: 04/04/2021 CLINICAL DATA:  Initial evaluation for lower back pain, fever, history of recent lumbar surgery. Evaluate for epidural abscess. EXAM: MRI LUMBAR  SPINE WITHOUT CONTRAST TECHNIQUE: Multiplanar, multisequence MR imaging of the lumbar spine was performed. No intravenous contrast was administered. COMPARISON:  Comparison made with prior CT from earlier the same day as well as previous MRI from 12/28/2020. FINDINGS: Segmentation: Standard. Lowest well-formed disc space labeled the L5-S1 level. Alignment: Mild levoscoliosis. Trace chronic retrolisthesis of L1 on L2 through L3 on L4, with additional trace retrolisthesis of T11 on T12, stable. Vertebrae: Susceptibility artifact from prior posterior fusion at L1 through the sacrum, with bilateral transpedicular screws in place at all levels. Interbody fusion in place at L4-5 and L5-S1. Hardware better evaluated on prior CT. Chronic height loss with wedging deformity of L3, stable. Vertebral body height otherwise maintained without acute or interval fracture. Visualized bone marrow signal intensity within normal limits. Benign hemangioma noted within the T12 vertebral body. No worrisome osseous lesions. Previously seen reactive marrow edema about the L5-S1 interspace is improved as compared to prior MRI. No other signal changes to suggest osteomyelitis discitis or septic arthritis. Conus medullaris and cauda equina: Conus extends to the T12-L1 level. Conus and cauda equina appear normal. No evidence for arachnoiditis. No epidural abscess or other collection. Paraspinal and other soft tissues: Postoperative changes from recent surgery present throughout the lower posterior paraspinous soft tissues. Residual collection seen extending along the lower midline incision measures approximately 2.6 x 3.6 x 8.8 cm (AP by transverse by craniocaudad). The inferior aspect of this collection demonstrate subfascial extension, communicating within additional loculated collection at the laminectomy defect extending from L2 through L5 (series 6, image 21). This second deeper loculated collection measures approximately 3.4 x 2.1 x 6.9  cm (transverse by AP by craniocaudad) (series 6, image 23). No significant inflammatory changes seen about these collections, which are favored to reflect benign postoperative seromas. Superimposed infection difficult to exclude, and could be considered in the correct clinical setting. Disc levels: L1-2: Trace retrolisthesis with degenerative intervertebral disc space narrowing, diffuse disc bulge, and disc desiccation. Prior posterior fusion. Residual mild facet hypertrophy. No spinal stenosis. Foramina appear patent. L2-3: Chronic retrolisthesis with advanced degenerative intervertebral disc space narrowing. Diffuse disc bulge with disc desiccation and reactive endplate spurring. Residual moderate facet hypertrophy. Prior posterior decompression with fusion. Thecal sac remains patent. No significant foraminal encroachment. L3-4: Trace chronic retrolisthesis. Advanced degenerative intervertebral disc space narrowing with disc desiccation and diffuse disc bulge. Reactive endplate spurring. Prior posterior decompression with fusion. Residual moderate facet hypertrophy. Residual mild to moderate left lateral recess stenosis, stable (series 6, image 16). Thecal sac otherwise patent. Mild bilateral L3 foraminal narrowing, grossly stable. L4-5: Prior posterior and interbody fusion with posterior decompression. No residual spinal stenosis. Foramina remain patent. L5-S1: Prior posterior and interbody fusion with posterior decompression. No residual spinal stenosis. Foramina appear grossly patent. IMPRESSION: 1. Postoperative changes  from recent surgery with two contiguous loculated collections at the laminectomy defect and along the lower midline incision as detailed above. Given overall appearance, findings favored to reflect benign postoperative seromas. Superimposed infection difficult to exclude, and could be considered in the correct clinical setting. 2. No other evidence for acute infection within the lumbar spine.  No evidence for osteomyelitis discitis, septic arthritis, or epidural abscess. 3. Postoperative changes from prior posterior and interbody fusion at L1 through the sacrum as detailed above. Residual mild to moderate left lateral recess narrowing at L3-4. No other significant residual stenosis. Electronically Signed   By: Jeannine Boga M.D.   On: 04/04/2021 23:42   CT ABDOMEN PELVIS W CONTRAST  Result Date: 04/04/2021 CLINICAL DATA:  Fever and back pain. Altered mental status. Concern for sepsis. Recent lumbar surgery with drainage from the surgical site. EXAM: CT ABDOMEN AND PELVIS WITH CONTRAST TECHNIQUE: Multidetector CT imaging of the abdomen and pelvis was performed using the standard protocol following bolus administration of intravenous contrast. CONTRAST:  15mL OMNIPAQUE IOHEXOL 350 MG/ML SOLN COMPARISON:  None. FINDINGS: Lower chest: No acute abnormality. Hepatobiliary: No focal liver abnormality is seen. Status post cholecystectomy. No biliary dilatation. Pancreas: Moderate atrophy. No ductal dilatation or surrounding inflammatory changes. Spleen: Mildly enlarged.  No focal abnormality. Adrenals/Urinary Tract: Unchanged homogeneously enhancing 1.1 cm right adrenal nodule, indeterminate on prior CTs. The left adrenal gland is unremarkable. Kidneys are normal without mass, calculi, or hydronephrosis. Trace focus of air within the bladder likely related to recent instrumentation. The bladder is otherwise unremarkable. Stomach/Bowel: Stomach is within normal limits. Appendix appears normal. No evidence of bowel wall thickening, distention, or inflammatory changes. Vascular/Lymphatic: Aortoiliac atherosclerotic vascular disease. Prominent subcentimeter right inguinal and external iliac lymph nodes are likely reactive. Reproductive: Uterus and bilateral adnexa are unremarkable. Other: No free fluid or pneumoperitoneum. Musculoskeletal: Prior L1-S1 fusion with interval revision at L5-S1 and Skoczylas  extension of the fusion to the pelvis. Unchanged lucency around the L5-S1 interbody graft with adjacent endplate sclerosis. Continued lucency around the S1 screws. No progressive bony destruction. Similar scarring in the posterior midline lumbar soft tissues with Mcgillivray 2.7 x 4.0 x 10.6 cm thick-walled rim enhancing gas and fluid collection in the midline extending from T11 to L3. IMPRESSION: 1. Kidane 2.7 x 4.0 x 10.6 cm subcutaneous abscess in the midline back extending from T11 to L3. 2. Prior L1-S1 fusion with interval revision at L5-S1 and [place] extension of the fusion to the pelvis. Unchanged L5-S1 pseudoarthrosis. 3. Unchanged 1.1 cm indeterminate right adrenal nodule. This is probably benign. Consider follow-up adrenal protocol CT in 12 months. This recommendation follows ACR consensus guidelines: Management of Incidental Adrenal Masses: A White Paper of the ACR Incidental Findings Committee. J Am Coll Radiol 2017;14:1038-1044. 4. Mild splenomegaly. 5. Aortic Atherosclerosis (ICD10-I70.0). Electronically Signed   By: Titus Dubin M.D.   On: 04/04/2021 13:44   CT L-SPINE NO CHARGE  Result Date: 04/04/2021 CLINICAL DATA:  Back pain EXAM: CT LUMBAR SPINE WITHOUT CONTRAST TECHNIQUE: Multidetector CT imaging of the lumbar spine was performed without intravenous contrast administration. Multiplanar CT image reconstructions were also generated. COMPARISON:  None. FINDINGS: Segmentation: Normal Alignment: Unchanged alignment. Vertebrae: Stable vertebral body heights with most notable compression deformities at L2, L3, and L4. No evidence of progressive height loss. Paraspinal and other soft tissues: There is a thick-walled rim enhancing collection with punctate foci of gas along the posterior soft tissues [place] since the prior CT consistent with interval surgery. This measures up  to 3.5 x 2.8 cm (series 4, image 15). Disc levels: Unchanged chronic postoperative changes from L1 through L4. Solid interbody arthrodesis  from L1 through L4. L4-L5: Posterior decompression, anterior posterior fusion stable in comparison to prior exam. There is a single right-sided L4 pedicle screw. Unchanged degree of developing interbody arthrodesis with mild solid bridging posteriorly. L5-S1: Interval spinal fusion revision, with replacement of the bilateral S1 screws and a Archuletta fusion through the pelvis. [place] hardware is intact. There is persistent mild lucency along the S1 screw tracks. Stable positioning of the interbody implant despite this substantial adjacent endplate bone loss. Prior posterior decompression at this level as well. IMPRESSION: Interval lumbosacral fusion revision with bilateral S1 screw replacement and L4 to pelvis posterolateral fusion. [place] hardware is intact. Unchanged L5-S1 pseudoarthrosis. Persistent lucency along the S1 screw tracks. Thick wall rim enhancing collection along the posterior subacute soft tissues consistent recent surgery measuring 3.5 x 2.8 cm. The sterility of this collection is indeterminate by CT. Unchanged interbody arthrodesis from L1 through L4 and mild posterior element arthrodesis at L4-L5. Electronically Signed   By: Maurine Simmering M.D.   On: 04/04/2021 13:43   DG Chest Port 1 View  Result Date: 04/04/2021 CLINICAL DATA:  Questionable sepsis EXAM: PORTABLE CHEST 1 VIEW COMPARISON:  Chest radiograph 03/11/2021 FINDINGS: The right IJ vascular catheter has been removed. The cardiomediastinal silhouette is stable. Lung volumes are markedly diminished. There is no focal consolidation or pulmonary edema. There is no pleural effusion or pneumothorax. There is no acute osseous abnormality. IMPRESSION: Markedly low lung volumes. Otherwise, no radiographic evidence of acute cardiopulmonary process. Electronically Signed   By: Valetta Mole M.D.   On: 04/04/2021 10:59   DG CHEST PORT 1 VIEW  Result Date: 03/11/2021 CLINICAL DATA:  Central line placement. EXAM: PORTABLE CHEST 1 VIEW COMPARISON:  December 28, 2020. FINDINGS: Stable cardiomediastinal silhouette. Hypoinflation of the lungs is noted. Right internal jugular catheter is noted with distal tip in expected position of cavoatrial junction. No pneumothorax is noted. Both lungs are clear. The visualized skeletal structures are unremarkable. IMPRESSION: Right internal jugular catheter is noted with distal tip in expected position of cavoatrial junction. No pneumothorax is noted. Electronically Signed   By: Marijo Conception M.D.   On: 03/11/2021 13:01   DG C-Arm 1-60 Min-No Report  Result Date: 03/10/2021 Fluoroscopy was utilized by the requesting physician.  No radiographic interpretation.   DG C-Arm 1-60 Min-No Report  Result Date: 03/10/2021 Fluoroscopy was utilized by the requesting physician.  No radiographic interpretation.   DG C-Arm 1-60 Min-No Report  Result Date: 03/10/2021 Fluoroscopy was utilized by the requesting physician.  No radiographic interpretation.   DG C-Arm 1-60 Min-No Report  Result Date: 03/10/2021 Fluoroscopy was utilized by the requesting physician.  No radiographic interpretation.   DG C-Arm 1-60 Min-No Report  Result Date: 03/10/2021 Fluoroscopy was utilized by the requesting physician.  No radiographic interpretation.    Labs:  CBC: Recent Labs    03/11/21 1019 03/11/21 1833 04/04/21 1040 04/04/21 1331 04/05/21 0333 04/06/21 0931  WBC 8.4  --  11.2*  --  4.3 5.0  HGB 10.7*   < > 11.1* 10.9* 9.2* 11.2*  HCT 33.3*   < > 35.4* 32.0* 29.8* 37.9  PLT 129*  --  161  --  110* 145*   < > = values in this interval not displayed.    COAGS: Recent Labs    12/28/20 1803 12/30/20 1121 04/04/21 1040  INR  1.3* 1.3* 1.1  APTT 34  --  32    BMP: Recent Labs    03/10/21 0601 03/10/21 1127 03/11/21 1019 03/11/21 1833 04/04/21 1040 04/04/21 1331 04/05/21 0333  NA 137 142 137 138 136 138 138  K 3.4* 3.3* 4.2 4.2 3.6 3.6 3.4*  CL 102 102 100  --  102  --  107  CO2 27  --  28  --  25  --  23   GLUCOSE 162* 143* 190*  --  159*  --  105*  BUN 21 19 13   --  27*  --  22  CALCIUM 8.8*  --  8.7*  --  8.7*  --  7.9*  CREATININE 1.15* 0.90 0.98  --  1.46*  --  1.07*  GFRNONAA 52*  --  >60  --  39*  --  56*    LIVER FUNCTION TESTS: Recent Labs    10/19/20 1529 12/28/20 1707 12/30/20 1121 04/04/21 1040  BILITOT 1.0 1.3* 0.8 0.7  AST 25 19 15 21   ALT 15 14 12 11   ALKPHOS 156* 137* 129* 185*  PROT 6.8 6.2* 6.1* 6.9  ALBUMIN 3.0* 2.3* 1.9* 2.8*    TUMOR MARKERS: No results for input(s): AFPTM, CEA, CA199, CHROMGRNA in the last 8760 hours.  Assessment and Plan:  Recent L5-S1 surgery now with paraspinal fluid collections: Delorise Royals, 71 year old female, is tentatively scheduled tomorrow for an image-guided paraspinal fluid collection aspiration with possible drain placement.  Risks and benefits discussed with the patient including bleeding, infection, damage to adjacent structures and sepsis.  All of the patient's questions were answered, patient is agreeable to proceed. She will be NPO at midnight. AM labs have been ordered. Lovenox will be held this afternoon.  Consent signed and in IR.  Thank you for this interesting consult.  I greatly enjoyed meeting Jeanise Memon and look forward to participating in their care.  A copy of this report was sent to the requesting provider on this date.  Electronically Signed: Soyla Dryer, AGACNP-BC [phone] 04/06/2021, 10:47 AM   I spent a total of 20 Minutes    in face to face in clinical consultation, greater than 50% of which was counseling/coordinating care for paraspinal fluid collection aspiration with possible drain placement. Marland Kitchen

## 2021-04-06 NOTE — Progress Notes (Signed)
[facility] PROGRESS NOTE    Progress Note  Tonya Baker  [id number] DOB: [birth year] DOA: 04/04/2021 PCP: Garwin Brothers, MD     Brief Narrative:   Tonya Baker is an 71 y.o. 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 discharged on the ninth who started to have back pain the day prior to admission with a temperature of 102 in the ED was found to be hypotensive she was fluid resuscitated but remained confused CT scan of the abdomen pelvis showed Kahler 2 x 7 x 4 by 10 subcutaneous abscess in the midline back extending from T11-L3 with a CT of the C-spine that showed persistent lucency along the S1 screw tract, there was a thick rim enhancing collection along the posterior soup acute soft tissue consistent with recent surgery measuring 3 x 2 cm   Significant studies: MRI of the thoracic spine showed postsurgical changes with visualization of fluid collection along the posterior surgical incision MRI of the lumbar spine showed 2 contiguous loculated collection at the laminectomy to me defect along with lower midline incision   Antibiotics: 04/04/2021 IV vancomycin 04/04/2021 IV Zosyn  Microbiology data: 04/05/2019 blood culture: Negative till date  Procedures: None  Assessment/Plan:   Severe sepsis due to abscess of her lumbar spine: She was started empirically on IV vancomycin and cefepime and fluid resuscitated. Blood cultures have been sent. MRI lumbar spine showed 2 contiguous loculated collection at the laminectomy defect along the lower midline incision. Awaiting neurosurgery further recommendations. Will get IR involved to see if they are amenable for drainage.  Acute metabolic encephalopathy: Secondary to sepsis, CT scan of the head showed no acute findings.  Insulin-dependent  diabetes mellitus type 2: Blood glucose is well controlled. Will allow a diet change her sliding scale insulin  Diabetic neuropathy, anxiety/depression: Holding oral medications.  Orthostatic hypotension: Resume midodrine.  Diabetic neuropathy: Resume gabapentin.  Hypokalemia: Replete orally recheck in the morning.  Acute thrombocytopenia: Recheck a CBC today question due to infectious etiology.   DVT prophylaxis: lovenox Family Communication:none Status is: Inpatient  Remains inpatient appropriate because: Severe sepsis        Code Status:     Code Status Orders  (From admission, onward)           Start     Ordered   04/04/21 1427  Full code  Continuous        04/04/21 1428           Code Status History     Date Active Date Inactive Code Status Order ID Comments User Context   03/10/2021 1756 03/14/2021 2212 Full Code [id number]  Judith Part, MD Inpatient   12/28/2020 2230 01/04/2021 0406 Full Code [id number]  Renee Pain, MD ED         IV Access:   Peripheral IV   Procedures and diagnostic studies:   CT HEAD WO CONTRAST (5MM)  Result Date: 04/04/2021 CLINICAL DATA:  Mental status change, unknown cause EXAM: CT HEAD WITHOUT CONTRAST TECHNIQUE: Contiguous axial images were obtained from the base of the skull through the vertex without intravenous contrast. COMPARISON:  Head CT 03/11/2021 FINDINGS: Brain: No evidence of acute intracranial hemorrhage or extra-axial collection.No evidence of mass lesion/concern mass effect.The ventricles are normal in size. Vascular: No hyperdense vessel or unexpected calcification. Skull: Normal. Negative for fracture or focal lesion. Sinuses/Orbits: No  acute finding. Other: None. IMPRESSION: No acute intracranial abnormality. Electronically Signed   By: Maurine Simmering M.D.   On: 04/04/2021 13:26   MR THORACIC SPINE WO CONTRAST  Result Date: 04/04/2021 CLINICAL DATA:  Initial evaluation for acute back  pain, fever. History of prior lumbar surgery. Evaluate for epidural abscess. EXAM: MRI THORACIC SPINE WITHOUT CONTRAST TECHNIQUE: Multiplanar, multisequence MR imaging of the thoracic spine was performed. No intravenous contrast was administered. COMPARISON:  Prior CT from earlier the same day. FINDINGS: Alignment: Straightening of the normal thoracic kyphosis. No listhesis. Vertebrae: Susceptibility artifact related to prior posterior fusion partially visualized extending from L1 inferiorly. Vertebral body height maintained without acute or chronic fracture. Bone marrow signal intensity within normal limits. Few scattered benign hemangiomata noted, most prominent of which present within the T7 and T12 vertebral bodies. No worrisome osseous lesions. No findings to suggest osteomyelitis discitis or septic arthritis. Cord: Normal signal and morphology. No epidural abscess or other collection. Paraspinal and other soft tissues: Postsurgical changes related to recent lumbar surgery partially visualized within the posterior soft tissues of the visualized upper back. Partially visualized collection along the surgical incision measures 2.5 x 3.7 cm (series 10, image 37). Remainder of the visualized paraspinal soft tissues otherwise unremarkable about the thoracic spine. Trace layering bilateral pleural effusions noted. Visualized visceral structures otherwise unremarkable. Disc levels: Coronary for age multilevel disc desiccation present throughout the thoracic spine. Minimal noncompressive disc bulging noted at T10-11 through T12-L1. Scattered thoracic facet hypertrophy noted throughout the mid and lower thoracic spine. No significant spinal stenosis. Mild to moderate bilateral foraminal narrowing noted at T7-8 through T9-10, largely due to facet disease. No frank neural impingement. IMPRESSION: 1. No acute abnormality within the thoracic spine. 2. Postsurgical changes related to recent lumbar surgery, with partially  visualized collection along the posterior surgical incision. While this finding may simply reflect a benign postoperative seroma, superimposed infection is difficult to exclude, and could be considered in the correct clinical setting. Correlation with history, laboratory values, and symptomatology recommended. 3. No other acute abnormality or evidence for infection elsewhere within the thoracic spine. 4. Mild for age multilevel thoracic spondylosis without significant stenosis or neural impingement. Electronically Signed   By: Jeannine Boga M.D.   On: 04/04/2021 22:55   MR LUMBAR SPINE WO CONTRAST  Result Date: 04/04/2021 CLINICAL DATA:  Initial evaluation for lower back pain, fever, history of recent lumbar surgery. Evaluate for epidural abscess. EXAM: MRI LUMBAR SPINE WITHOUT CONTRAST TECHNIQUE: Multiplanar, multisequence MR imaging of the lumbar spine was performed. No intravenous contrast was administered. COMPARISON:  Comparison made with prior CT from earlier the same day as well as previous MRI from 12/28/2020. FINDINGS: Segmentation: Standard. Lowest well-formed disc space labeled the L5-S1 level. Alignment: Mild levoscoliosis. Trace chronic retrolisthesis of L1 on L2 through L3 on L4, with additional trace retrolisthesis of T11 on T12, stable. Vertebrae: Susceptibility artifact from prior posterior fusion at L1 through the sacrum, with bilateral transpedicular screws in place at all levels. Interbody fusion in place at L4-5 and L5-S1. Hardware better evaluated on prior CT. Chronic height loss with wedging deformity of L3, stable. Vertebral body height otherwise maintained without acute or interval fracture. Visualized bone marrow signal intensity within normal limits. Benign hemangioma noted within the T12 vertebral body. No worrisome osseous lesions. Previously seen reactive marrow edema about the L5-S1 interspace is improved as compared to prior MRI. No other signal changes to suggest  osteomyelitis discitis or septic arthritis. Conus medullaris and cauda  equina: Conus extends to the T12-L1 level. Conus and cauda equina appear normal. No evidence for arachnoiditis. No epidural abscess or other collection. Paraspinal and other soft tissues: Postoperative changes from recent surgery present throughout the lower posterior paraspinous soft tissues. Residual collection seen extending along the lower midline incision measures approximately 2.6 x 3.6 x 8.8 cm (AP by transverse by craniocaudad). The inferior aspect of this collection demonstrate subfascial extension, communicating within additional loculated collection at the laminectomy defect extending from L2 through L5 (series 6, image 21). This second deeper loculated collection measures approximately 3.4 x 2.1 x 6.9 cm (transverse by AP by craniocaudad) (series 6, image 23). No significant inflammatory changes seen about these collections, which are favored to reflect benign postoperative seromas. Superimposed infection difficult to exclude, and could be considered in the correct clinical setting. Disc levels: L1-2: Trace retrolisthesis with degenerative intervertebral disc space narrowing, diffuse disc bulge, and disc desiccation. Prior posterior fusion. Residual mild facet hypertrophy. No spinal stenosis. Foramina appear patent. L2-3: Chronic retrolisthesis with advanced degenerative intervertebral disc space narrowing. Diffuse disc bulge with disc desiccation and reactive endplate spurring. Residual moderate facet hypertrophy. Prior posterior decompression with fusion. Thecal sac remains patent. No significant foraminal encroachment. L3-4: Trace chronic retrolisthesis. Advanced degenerative intervertebral disc space narrowing with disc desiccation and diffuse disc bulge. Reactive endplate spurring. Prior posterior decompression with fusion. Residual moderate facet hypertrophy. Residual mild to moderate left lateral recess stenosis, stable (series  6, image 16). Thecal sac otherwise patent. Mild bilateral L3 foraminal narrowing, grossly stable. L4-5: Prior posterior and interbody fusion with posterior decompression. No residual spinal stenosis. Foramina remain patent. L5-S1: Prior posterior and interbody fusion with posterior decompression. No residual spinal stenosis. Foramina appear grossly patent. IMPRESSION: 1. Postoperative changes from recent surgery with two contiguous loculated collections at the laminectomy defect and along the lower midline incision as detailed above. Given overall appearance, findings favored to reflect benign postoperative seromas. Superimposed infection difficult to exclude, and could be considered in the correct clinical setting. 2. No other evidence for acute infection within the lumbar spine. No evidence for osteomyelitis discitis, septic arthritis, or epidural abscess. 3. Postoperative changes from prior posterior and interbody fusion at L1 through the sacrum as detailed above. Residual mild to moderate left lateral recess narrowing at L3-4. No other significant residual stenosis. Electronically Signed   By: Jeannine Boga M.D.   On: 04/04/2021 23:42   CT ABDOMEN PELVIS W CONTRAST  Result Date: 04/04/2021 CLINICAL DATA:  Fever and back pain. Altered mental status. Concern for sepsis. Recent lumbar surgery with drainage from the surgical site. EXAM: CT ABDOMEN AND PELVIS WITH CONTRAST TECHNIQUE: Multidetector CT imaging of the abdomen and pelvis was performed using the standard protocol following bolus administration of intravenous contrast. CONTRAST:  22mL OMNIPAQUE IOHEXOL 350 MG/ML SOLN COMPARISON:  None. FINDINGS: Lower chest: No acute abnormality. Hepatobiliary: No focal liver abnormality is seen. Status post cholecystectomy. No biliary dilatation. Pancreas: Moderate atrophy. No ductal dilatation or surrounding inflammatory changes. Spleen: Mildly enlarged.  No focal abnormality. Adrenals/Urinary Tract:  Unchanged homogeneously enhancing 1.1 cm right adrenal nodule, indeterminate on prior CTs. The left adrenal gland is unremarkable. Kidneys are normal without mass, calculi, or hydronephrosis. Trace focus of air within the bladder likely related to recent instrumentation. The bladder is otherwise unremarkable. Stomach/Bowel: Stomach is within normal limits. Appendix appears normal. No evidence of bowel wall thickening, distention, or inflammatory changes. Vascular/Lymphatic: Aortoiliac atherosclerotic vascular disease. Prominent subcentimeter right inguinal and external iliac lymph nodes are  likely reactive. Reproductive: Uterus and bilateral adnexa are unremarkable. Other: No free fluid or pneumoperitoneum. Musculoskeletal: Prior L1-S1 fusion with interval revision at L5-S1 and Hollenkamp extension of the fusion to the pelvis. Unchanged lucency around the L5-S1 interbody graft with adjacent endplate sclerosis. Continued lucency around the S1 screws. No progressive bony destruction. Similar scarring in the posterior midline lumbar soft tissues with [place] 2.7 x 4.0 x 10.6 cm thick-walled rim enhancing gas and fluid collection in the midline extending from T11 to L3. IMPRESSION: 1. Peeks 2.7 x 4.0 x 10.6 cm subcutaneous abscess in the midline back extending from T11 to L3. 2. Prior L1-S1 fusion with interval revision at L5-S1 and Smeal extension of the fusion to the pelvis. Unchanged L5-S1 pseudoarthrosis. 3. Unchanged 1.1 cm indeterminate right adrenal nodule. This is probably benign. Consider follow-up adrenal protocol CT in 12 months. This recommendation follows ACR consensus guidelines: Management of Incidental Adrenal Masses: A White Paper of the ACR Incidental Findings Committee. J Am Coll Radiol 2017;14:1038-1044. 4. Mild splenomegaly. 5. Aortic Atherosclerosis (ICD10-I70.0). Electronically Signed   By: Titus Dubin M.D.   On: 04/04/2021 13:44   CT L-SPINE NO CHARGE  Result Date: 04/04/2021 CLINICAL DATA:  Back pain  EXAM: CT LUMBAR SPINE WITHOUT CONTRAST TECHNIQUE: Multidetector CT imaging of the lumbar spine was performed without intravenous contrast administration. Multiplanar CT image reconstructions were also generated. COMPARISON:  None. FINDINGS: Segmentation: Normal Alignment: Unchanged alignment. Vertebrae: Stable vertebral body heights with most notable compression deformities at L2, L3, and L4. No evidence of progressive height loss. Paraspinal and other soft tissues: There is a thick-walled rim enhancing collection with punctate foci of gas along the posterior soft tissues Crable since the prior CT consistent with interval surgery. This measures up to 3.5 x 2.8 cm (series 4, image 15). Disc levels: Unchanged chronic postoperative changes from L1 through L4. Solid interbody arthrodesis from L1 through L4. L4-L5: Posterior decompression, anterior posterior fusion stable in comparison to prior exam. There is a single right-sided L4 pedicle screw. Unchanged degree of developing interbody arthrodesis with mild solid bridging posteriorly. L5-S1: Interval spinal fusion revision, with replacement of the bilateral S1 screws and a Justo fusion through the pelvis. Czaja hardware is intact. There is persistent mild lucency along the S1 screw tracks. Stable positioning of the interbody implant despite this substantial adjacent endplate bone loss. Prior posterior decompression at this level as well. IMPRESSION: Interval lumbosacral fusion revision with bilateral S1 screw replacement and L4 to pelvis posterolateral fusion. Frickey hardware is intact. Unchanged L5-S1 pseudoarthrosis. Persistent lucency along the S1 screw tracks. Thick wall rim enhancing collection along the posterior subacute soft tissues consistent recent surgery measuring 3.5 x 2.8 cm. The sterility of this collection is indeterminate by CT. Unchanged interbody arthrodesis from L1 through L4 and mild posterior element arthrodesis at L4-L5. Electronically Signed   By: Maurine Simmering M.D.   On: 04/04/2021 13:43   DG Chest Port 1 View  Result Date: 04/04/2021 CLINICAL DATA:  Questionable sepsis EXAM: PORTABLE CHEST 1 VIEW COMPARISON:  Chest radiograph 03/11/2021 FINDINGS: The right IJ vascular catheter has been removed. The cardiomediastinal silhouette is stable. Lung volumes are markedly diminished. There is no focal consolidation or pulmonary edema. There is no pleural effusion or pneumothorax. There is no acute osseous abnormality. IMPRESSION: Markedly low lung volumes. Otherwise, no radiographic evidence of acute cardiopulmonary process. Electronically Signed   By: Valetta Mole M.D.   On: 04/04/2021 10:59     Medical Consultants:   None.  Subjective:    Tonya Baker relates her pain is controlled.  Objective:    Vitals:   04/05/21 1932 04/05/21 2256 04/06/21 0336 04/06/21 0901  BP: 136/62 (!) 82/48 (!) 89/57 (!) 144/67  Pulse: (!) 59 61 (!) 54 66  Resp: 12 15 11 14   Temp: 97.6 F (36.4 C) 98 F (36.7 C)  (!) 97.3 F (36.3 C)  TempSrc: Oral Oral  Oral  SpO2: 100% 99% 100% 100%  Weight:      Height:       SpO2: 100 % O2 Flow Rate (L/min): 2 L/min   Intake/Output Summary (Last 24 hours) at 04/06/2021 0910 Last data filed at 04/06/2021 0902 Gross per 24 hour  Intake 165.05 ml  Output 2100 ml  Net -1934.95 ml   Filed Weights   04/04/21 1029  Weight: 86.2 kg    Exam: General exam: In no acute distress. Respiratory system: Good air movement and clear to auscultation. Cardiovascular system: S1 & S2 heard, RRR. No JVD. Gastrointestinal system: Abdomen is nondistended, soft and nontender.  Psychiatry: Judgement and insight appear normal. Mood & affect appropriate.   Data Reviewed:    Labs: Basic Metabolic Panel: Recent Labs  Lab 04/04/21 1040 04/04/21 1331 04/05/21 0333  NA 136 138 138  K 3.6 3.6 3.4*  CL 102  --  107  CO2 25  --  23  GLUCOSE 159*  --  105*  BUN 27*  --  22  CREATININE 1.46*  --  1.07*  CALCIUM 8.7*  --  7.9*     GFR Estimated Creatinine Clearance: 52.7 mL/min (A) (by C-G formula based on SCr of 1.07 mg/dL (H)). Liver Function Tests: Recent Labs  Lab 04/04/21 1040  AST 21  ALT 11  ALKPHOS 185*  BILITOT 0.7  PROT 6.9  ALBUMIN 2.8*    No results for input(s): LIPASE, AMYLASE in the last 168 hours. Recent Labs  Lab 04/04/21 1305  AMMONIA <10    Coagulation profile Recent Labs  Lab 04/04/21 1040  INR 1.1    COVID-19 Labs  No results for input(s): DDIMER, FERRITIN, LDH, CRP in the last 72 hours.  Lab Results  Component Value Date   SARSCOV2NAA NEGATIVE 04/04/2021   SARSCOV2NAA NEGATIVE 03/13/2021   [place] NEGATIVE 03/10/2021   [place] NEGATIVE 01/02/2021    CBC: Recent Labs  Lab 04/04/21 1040 04/04/21 1331 04/05/21 0333  WBC 11.2*  --  4.3  NEUTROABS 9.6*  --   --   HGB 11.1* 10.9* 9.2*  HCT 35.4* 32.0* 29.8*  MCV 88.5  --  90.0  PLT 161  --  110*    Cardiac Enzymes: No results for input(s): CKTOTAL, CKMB, CKMBINDEX, TROPONINI in the last 168 hours. BNP (last 3 results) No results for input(s): PROBNP in the last 8760 hours. CBG: Recent Labs  Lab 04/05/21 1150 04/05/21 1616 04/05/21 2104 04/05/21 2106 04/06/21 0626  GLUCAP 95 86 39* 77 77    D-Dimer: No results for input(s): DDIMER in the last 72 hours. Hgb A1c: No results for input(s): HGBA1C in the last 72 hours. Lipid Profile: No results for input(s): CHOL, HDL, LDLCALC, TRIG, CHOLHDL, LDLDIRECT in the last 72 hours. Thyroid function studies: Recent Labs    04/04/21 1040  TSH 0.884    Anemia work up: No results for input(s): VITAMINB12, FOLATE, FERRITIN, TIBC, IRON, RETICCTPCT in the last 72 hours. Sepsis Labs: Recent Labs  Lab 04/04/21 1040 04/04/21 1814 04/05/21 0333  WBC 11.2*  --  4.3  LATICACIDVEN 2.0* 1.8  --     Microbiology Recent Results (from the past 240 hour(s))  Blood Culture (routine x 2)     Status: None (Preliminary result)   Collection Time: 04/04/21  10:40 AM   Specimen: BLOOD LEFT HAND  Result Value Ref Range Status   Specimen Description BLOOD LEFT HAND  Final   Special Requests   Final    BOTTLES DRAWN AEROBIC AND ANAEROBIC Blood Culture adequate volume   Culture   Final    NO GROWTH < 24 HOURS Performed at [facility], [address] [place], [place] [address]    Report Status PENDING  Incomplete  Resp Panel by RT-PCR (Flu A&B, Covid) Nasopharyngeal Swab     Status: None   Collection Time: 04/04/21 11:00 AM   Specimen: Nasopharyngeal Swab; Nasopharyngeal(NP) swabs in vial transport medium  Result Value Ref Range Status   SARS Coronavirus 2 by RT PCR NEGATIVE NEGATIVE Final    Comment: (NOTE) SARS-CoV-2 target nucleic acids are NOT DETECTED.  The SARS-CoV-2 RNA is generally detectable in upper respiratory specimens during the acute phase of infection. The lowest concentration of SARS-CoV-2 viral copies this assay can detect is 138 copies/mL. A negative result does not preclude SARS-Cov-2 infection and should not be used as the sole basis for treatment or other patient management decisions. A negative result may occur with  improper specimen collection/handling, submission of specimen other than nasopharyngeal swab, presence of viral mutation(s) within the areas targeted by this assay, and inadequate number of viral copies(<138 copies/mL). A negative result must be combined with clinical observations, patient history, and epidemiological information. The expected result is Negative.  Fact Sheet for Patients:  BloggerCourse.com  Fact Sheet for Healthcare Providers:  SeriousBroker.it  This test is no t yet approved or cleared by the [place] FDA and  has been authorized for detection and/or diagnosis of SARS-CoV-2 by FDA under an Emergency Use Authorization (EUA). This EUA will remain  in effect (meaning this test can be used) for the duration of the COVID-19  declaration under Section 564(b)(1) of the Act, 21 U.S.C.section 360bbb-3(b)(1), unless the authorization is terminated  or revoked sooner.       Influenza A by PCR NEGATIVE NEGATIVE Final   Influenza B by PCR NEGATIVE NEGATIVE Final    Comment: (NOTE) The Xpert Xpress SARS-CoV-2/FLU/RSV plus assay is intended as an aid in the diagnosis of influenza from Nasopharyngeal swab specimens and should not be used as a sole basis for treatment. Nasal washings and aspirates are unacceptable for Xpert Xpress SARS-CoV-2/FLU/RSV testing.  Fact Sheet for Patients: BloggerCourse.com  Fact Sheet for Healthcare Providers: SeriousBroker.it  This test is not yet approved or cleared by the [place] FDA and has been authorized for detection and/or diagnosis of SARS-CoV-2 by FDA under an Emergency Use Authorization (EUA). This EUA will remain in effect (meaning this test can be used) for the duration of the COVID-19 declaration under Section 564(b)(1) of the Act, 21 U.S.C. section 360bbb-3(b)(1), unless the authorization is terminated or revoked.  Performed at [facility], [address] [place], [place] [address]   Blood Culture (routine x 2)     Status: None (Preliminary result)   Collection Time: 04/04/21 11:10 AM   Specimen: BLOOD RIGHT FOREARM  Result Value Ref Range Status   Specimen Description BLOOD RIGHT FOREARM  Final   Special Requests   Final    BOTTLES DRAWN AEROBIC AND ANAEROBIC Blood Culture results may  not be optimal due to an inadequate volume of blood received in culture bottles   Culture   Final    NO GROWTH < 24 HOURS Performed at [place] [address], [place], [place] [address]    Report Status PENDING  Incomplete     Medications:    aspirin  81 mg Oral q morning   atorvastatin  40 mg Oral QHS   enoxaparin (LOVENOX) injection  40 mg Subcutaneous Q24H   gabapentin  600 mg Oral TID   insulin  aspart  0-15 Units Subcutaneous TID WC   insulin glargine-yfgn  10 Units Subcutaneous QHS   metoCLOPramide  5 mg Oral TID AC & HS   midodrine  30 mg Oral TID with meals   morphine  15 mg Oral Q12H   pantoprazole  40 mg Oral BID   polyethylene glycol  17 g Oral q morning   QUEtiapine  25 mg Oral QHS   sertraline  100 mg Oral q morning   temazepam  30 mg Oral QHS   Continuous Infusions:  piperacillin-tazobactam (ZOSYN)  IV 3.375 g (04/06/21 0326)   vancomycin        LOS: 2 days   Charlynne Cousins  [facility]  04/06/2021, 9:10 AM

## 2021-04-06 NOTE — Progress Notes (Signed)
Pharmacy Antibiotic Note  Tonya Baker is a 71 y.o. female admitted on 04/04/2021 with sepsis and cellulitis.  Pharmacy has been consulted for zosyn and cefepime dosing.  Of note, patient's serum creatinine on admission was 1.46. It has since improved and stabilized at 1.07.   Scr used: 1.07 mg/dL Weight: 86.2 kg ABW; 54.7 kg IBW Vd coeff: 0.5 L/kg Est AUC: 435.4  Plan: Change to vancomycin 750 mg q24h for improved renal function Continue Zosyn 3.375 g q8h Follow-up WBC, renal function, clinical course, cultures  Deescalate when able. Levels at steady state as indicated  Height: 5\' 4"  (162.6 cm) Weight: 86.2 kg (190 lb) IBW/kg (Calculated) : 54.7  Temp (24hrs), Avg:97.6 F (36.4 C), Min:97.3 F (36.3 C), Max:98 F (36.7 C)  Recent Labs  Lab 04/04/21 1040 04/04/21 1814 04/05/21 0333 04/06/21 0931  WBC 11.2*  --  4.3 5.0  CREATININE 1.46*  --  1.07* 1.07*  LATICACIDVEN 2.0* 1.8  --   --     Estimated Creatinine Clearance: 52.7 mL/min (A) (by C-G formula based on SCr of 1.07 mg/dL (H)).    Allergies  Allergen Reactions   Duloxetine Hcl Other (See Comments)     tremors   Cortisone Other (See Comments)    Unknown reaction   Hydrocortisone Other (See Comments)    Unknown reaction   Latex Other (See Comments)    Unknown reaction    Antimicrobials this admission: Vancomycin 12/30 >>  Zosyn 12/30 >>  Dose adjustments this admission: Vancomycin 1250 mg IV q48h >> vancomycin 750 mg IV q24h   Microbiology results: 12/30 BCx: ngtd  Thank you for allowing pharmacy to be a part of this patients care.  Zenaida Deed, PharmD PGY1 Acute Care Pharmacy Resident  Phone: [phone] 04/06/2021  1:55 PM  Please check [email or web address] for unit-specific pharmacy phone numbers.

## 2021-04-07 ENCOUNTER — Inpatient Hospital Stay (HOSPITAL_COMMUNITY): Payer: Medicare Other

## 2021-04-07 DIAGNOSIS — G9340 Encephalopathy, unspecified: Secondary | ICD-10-CM

## 2021-04-07 DIAGNOSIS — G9341 Metabolic encephalopathy: Secondary | ICD-10-CM | POA: Diagnosis not present

## 2021-04-07 DIAGNOSIS — L02212 Cutaneous abscess of back [any part, except buttock]: Secondary | ICD-10-CM | POA: Diagnosis not present

## 2021-04-07 DIAGNOSIS — A419 Sepsis, unspecified organism: Secondary | ICD-10-CM | POA: Diagnosis not present

## 2021-04-07 DIAGNOSIS — N1831 Chronic kidney disease, stage 3a: Secondary | ICD-10-CM

## 2021-04-07 DIAGNOSIS — Z794 Long term (current) use of insulin: Secondary | ICD-10-CM

## 2021-04-07 DIAGNOSIS — E1140 Type 2 diabetes mellitus with diabetic neuropathy, unspecified: Secondary | ICD-10-CM

## 2021-04-07 LAB — PROTIME-INR
INR: 1.1 (ref 0.8–1.2)
Prothrombin Time: 13.7 seconds (ref 11.4–15.2)

## 2021-04-07 LAB — BASIC METABOLIC PANEL
Anion gap: 6 (ref 5–15)
BUN: 12 mg/dL (ref 8–23)
CO2: 29 mmol/L (ref 22–32)
Calcium: 8.9 mg/dL (ref 8.9–10.3)
Chloride: 103 mmol/L (ref 98–111)
Creatinine, Ser: 1.12 mg/dL — ABNORMAL HIGH (ref 0.44–1.00)
GFR, Estimated: 53 mL/min — ABNORMAL LOW (ref >60)
Glucose, Bld: 112 mg/dL — ABNORMAL HIGH (ref 70–99)
Potassium: 3.9 mmol/L (ref 3.5–5.1)
Sodium: 138 mmol/L (ref 135–145)

## 2021-04-07 LAB — GLUCOSE, CAPILLARY
Glucose-Capillary: 106 mg/dL — ABNORMAL HIGH (ref 70–99)
Glucose-Capillary: 90 mg/dL (ref 70–99)
Glucose-Capillary: 179 mg/dL — ABNORMAL HIGH (ref 70–99)
Glucose-Capillary: 128 mg/dL — ABNORMAL HIGH (ref 70–99)

## 2021-04-07 LAB — CBC
HCT: 36.1 % (ref 36.0–46.0)
Hemoglobin: 10.9 g/dL — ABNORMAL LOW (ref 12.0–15.0)
MCH: 27.3 pg (ref 26.0–34.0)
MCHC: 30.2 g/dL (ref 30.0–36.0)
MCV: 90.3 fL (ref 80.0–100.0)
Platelets: 147 10*3/uL — ABNORMAL LOW (ref 150–400)
RBC: 4 MIL/uL (ref 3.87–5.11)
RDW: 15.3 % (ref 11.5–15.5)
WBC: 5.7 10*3/uL (ref 4.0–10.5)
nRBC: 0 % (ref 0.0–0.2)

## 2021-04-07 MED ORDER — FENTANYL CITRATE (PF) 100 MCG/2ML IJ SOLN
INTRAMUSCULAR | Status: AC
  Filled 2021-04-07: qty 2

## 2021-04-07 MED ORDER — MIDAZOLAM HCL 2 MG/2ML IJ SOLN
INTRAMUSCULAR | Status: AC
  Filled 2021-04-07: qty 2

## 2021-04-07 MED ORDER — FENTANYL CITRATE (PF) 100 MCG/2ML IJ SOLN
INTRAMUSCULAR | Status: AC | PRN
Start: 2021-04-07 — End: 2021-04-07
  Administered 2021-04-07: 25 ug via INTRAVENOUS

## 2021-04-07 MED ORDER — LIDOCAINE HCL 1 % IJ SOLN
INTRAMUSCULAR | Status: AC
  Filled 2021-04-07: qty 10

## 2021-04-07 MED ORDER — MIDAZOLAM HCL 2 MG/2ML IJ SOLN
INTRAMUSCULAR | Status: AC | PRN
  Administered 2021-04-07: .5 mg via INTRAVENOUS

## 2021-04-07 NOTE — Progress Notes (Signed)
[facility] PROGRESS NOTE    Progress Note  Tonya Baker  [id number] DOB: Jun 25, 1950 DOA: 04/04/2021 PCP: Garwin Brothers, MD     Brief Narrative:   Tonya Baker is an 71 y.o. 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 discharged on the ninth who started to have back pain the day prior to admission with a temperature of 102 in the ED was found to be hypotensive she was fluid resuscitated but remained confused CT scan of the abdomen pelvis showed Barish 2 x 7 x 4 by 10 subcutaneous abscess in the midline back extending from T11-L3 with a CT of the C-spine that showed persistent lucency along the S1 screw tract, there was a thick rim enhancing collection along the posterior soup acute soft tissue consistent with recent surgery measuring 3 x 2 cm   Significant studies: MRI of the thoracic spine showed postsurgical changes with visualization of fluid collection along the posterior surgical incision MRI of the lumbar spine showed 2 contiguous loculated collection at the laminectomy to me defect along with lower midline incision   Antibiotics: 04/04/2021 IV vancomycin 04/04/2021 IV Zosyn  Microbiology data: 04/05/2019 blood culture: Negative till date  Procedures: None  Assessment/Plan:   Severe sepsis due to abscess of her lumbar spine: She was started empirically on IV vancomycin and cefepime and fluid resuscitated. Blood cultures have been sent. MRI lumbar spine showed 2 contiguous loculated collection at the laminectomy defect along the lower midline incision. IR has been consulted and the plan is to proceed with CT-guided aspiration of the fluid collection in the spine on 04/07/2021.Marland Kitchen  Acute metabolic encephalopathy: Secondary to sepsis, CT scan of the head showed no acute  findings.  Insulin-dependent diabetes mellitus type 2: Blood glucose is well controlled. Continue long-acting insulin plus sliding scale.  Diabetic neuropathy, anxiety/depression: Neurontin has been resumed.  Autonomic dysfunction Resume midodrine.  Diabetic neuropathy: Resume gabapentin.  Hypokalemia: Replete orally recheck in the morning.  Acute thrombocytopenia: Recheck a CBC today question due to infectious etiology.   DVT prophylaxis: lovenox Family Communication:none Status is: Inpatient  Remains inpatient appropriate because: Severe sepsis        Code Status:     Code Status Orders  (From admission, onward)           Start     Ordered   04/04/21 1427  Full code  Continuous        04/04/21 1428           Code Status History     Date Active Date Inactive Code Status Order ID Comments User Context   03/10/2021 1756 03/14/2021 2212 Full Code [id number]  Judith Part, MD Inpatient   12/28/2020 2230 01/04/2021 0406 Full Code [id number]  Renee Pain, MD ED         IV Access:   Peripheral IV   Procedures and diagnostic studies:   No results found.   Medical Consultants:   None.   Subjective:    Tonya Baker relates her pain continues to be controlled.  Objective:    Vitals:   04/06/21 2032 04/07/21 0022 04/07/21 0500 04/07/21 0754  BP: 124/66 114/72 (!) 94/52 (!) 112/59  Pulse: 66 (!) 58 (!) 59 60  Resp: (!) 22 20  12   Temp: (!) 97.5 F (36.4 C) (!) 97.5 F (36.4 C) (!) 97.5 F (36.4  C) (!) 97.5 F (36.4 C)  TempSrc: Oral Oral Oral Oral  SpO2: 100% 99% 100% 100%  Weight:      Height:       SpO2: 100 % O2 Flow Rate (L/min): 2 L/min   Intake/Output Summary (Last 24 hours) at 04/07/2021 0755 Last data filed at 04/07/2021 0520 Gross per 24 hour  Intake 240 ml  Output 3000 ml  Net -2760 ml    Filed Weights   04/04/21 1029  Weight: 86.2 kg    Exam: General exam: In no acute distress. Respiratory system:  Good air movement and clear to auscultation. Cardiovascular system: S1 & S2 heard, RRR. No JVD. Gastrointestinal system: Abdomen is nondistended, soft and nontender.  Extremities: No pedal edema. Skin: No rashes, lesions or ulcers Psychiatry: Judgement and insight appear normal. Mood & affect appropriate.   Data Reviewed:    Labs: Basic Metabolic Panel: Recent Labs  Lab 04/04/21 1040 04/04/21 1331 04/05/21 0333 04/06/21 0931 04/07/21 0615  NA 136 138 138 140 138  K 3.6 3.6 3.4* 4.4 3.9  CL 102  --  107 108 103  CO2 25  --  23 26 29   GLUCOSE 159*  --  105* 84 112*  BUN 27*  --  22 13 12   CREATININE 1.46*  --  1.07* 1.07* 1.12*  CALCIUM 8.7*  --  7.9* 8.7* 8.9  PHOS  --   --   --  4.0  --     GFR Estimated Creatinine Clearance: 50.4 mL/min (A) (by C-G formula based on SCr of 1.12 mg/dL (H)). Liver Function Tests: Recent Labs  Lab 04/04/21 1040 04/06/21 0931  AST 21  --   ALT 11  --   ALKPHOS 185*  --   BILITOT 0.7  --   PROT 6.9  --   ALBUMIN 2.8* 2.5*    No results for input(s): LIPASE, AMYLASE in the last 168 hours. Recent Labs  Lab 04/04/21 1305  AMMONIA <10    Coagulation profile Recent Labs  Lab 04/04/21 1040 04/07/21 0615  INR 1.1 1.1    COVID-19 Labs  No results for input(s): DDIMER, FERRITIN, LDH, CRP in the last 72 hours.  Lab Results  Component Value Date   SARSCOV2NAA NEGATIVE 04/04/2021   SARSCOV2NAA NEGATIVE 03/13/2021   [place] NEGATIVE 03/10/2021   [place] NEGATIVE 01/02/2021    CBC: Recent Labs  Lab 04/04/21 1040 04/04/21 1331 04/05/21 0333 04/06/21 0931 04/07/21 0615  WBC 11.2*  --  4.3 5.0 5.7  NEUTROABS 9.6*  --   --  2.7  --   HGB 11.1* 10.9* 9.2* 11.2* 10.9*  HCT 35.4* 32.0* 29.8* 37.9 36.1  MCV 88.5  --  90.0 91.8 90.3  PLT 161  --  110* 145* 147*    Cardiac Enzymes: No results for input(s): CKTOTAL, CKMB, CKMBINDEX, TROPONINI in the last 168 hours. BNP (last 3 results) No results for input(s):  PROBNP in the last 8760 hours. CBG: Recent Labs  Lab 04/06/21 0626 04/06/21 1113 04/06/21 1606 04/06/21 2150 04/07/21 0643  GLUCAP 77 71 80 122* 106*    D-Dimer: No results for input(s): DDIMER in the last 72 hours. Hgb A1c: No results for input(s): HGBA1C in the last 72 hours. Lipid Profile: No results for input(s): CHOL, HDL, LDLCALC, TRIG, CHOLHDL, LDLDIRECT in the last 72 hours. Thyroid function studies: Recent Labs    04/04/21 1040  TSH 0.884    Anemia work up: No results for input(s): VITAMINB12, FOLATE, FERRITIN, TIBC,  IRON, RETICCTPCT in the last 72 hours. Sepsis Labs: Recent Labs  Lab 04/04/21 1040 04/04/21 1814 04/05/21 0333 04/06/21 0931 04/07/21 0615  WBC 11.2*  --  4.3 5.0 5.7  LATICACIDVEN 2.0* 1.8  --   --   --     Microbiology Recent Results (from the past 240 hour(s))  Blood Culture (routine x 2)     Status: None (Preliminary result)   Collection Time: 04/04/21 10:40 AM   Specimen: BLOOD LEFT HAND  Result Value Ref Range Status   Specimen Description BLOOD LEFT HAND  Final   Special Requests   Final    BOTTLES DRAWN AEROBIC AND ANAEROBIC Blood Culture adequate volume   Culture   Final    NO GROWTH 2 DAYS Performed at [facility], [address] [place], [place] [address]    Report Status PENDING  Incomplete  Resp Panel by RT-PCR (Flu A&B, Covid) Nasopharyngeal Swab     Status: None   Collection Time: 04/04/21 11:00 AM   Specimen: Nasopharyngeal Swab; Nasopharyngeal(NP) swabs in vial transport medium  Result Value Ref Range Status   SARS Coronavirus 2 by RT PCR NEGATIVE NEGATIVE Final    Comment: (NOTE) SARS-CoV-2 target nucleic acids are NOT DETECTED.  The SARS-CoV-2 RNA is generally detectable in upper respiratory specimens during the acute phase of infection. The lowest concentration of SARS-CoV-2 viral copies this assay can detect is 138 copies/mL. A negative result does not preclude SARS-Cov-2 infection and should not be  used as the sole basis for treatment or other patient management decisions. A negative result may occur with  improper specimen collection/handling, submission of specimen other than nasopharyngeal swab, presence of viral mutation(s) within the areas targeted by this assay, and inadequate number of viral copies(<138 copies/mL). A negative result must be combined with clinical observations, patient history, and epidemiological information. The expected result is Negative.  Fact Sheet for Patients:  BloggerCourse.com  Fact Sheet for Healthcare Providers:  SeriousBroker.it  This test is no t yet approved or cleared by the [place] FDA and  has been authorized for detection and/or diagnosis of SARS-CoV-2 by FDA under an Emergency Use Authorization (EUA). This EUA will remain  in effect (meaning this test can be used) for the duration of the COVID-19 declaration under Section 564(b)(1) of the Act, 21 U.S.C.section 360bbb-3(b)(1), unless the authorization is terminated  or revoked sooner.       Influenza A by PCR NEGATIVE NEGATIVE Final   Influenza B by PCR NEGATIVE NEGATIVE Final    Comment: (NOTE) The Xpert Xpress SARS-CoV-2/FLU/RSV plus assay is intended as an aid in the diagnosis of influenza from Nasopharyngeal swab specimens and should not be used as a sole basis for treatment. Nasal washings and aspirates are unacceptable for Xpert Xpress SARS-CoV-2/FLU/RSV testing.  Fact Sheet for Patients: BloggerCourse.com  Fact Sheet for Healthcare Providers: SeriousBroker.it  This test is not yet approved or cleared by the [place] FDA and has been authorized for detection and/or diagnosis of SARS-CoV-2 by FDA under an Emergency Use Authorization (EUA). This EUA will remain in effect (meaning this test can be used) for the duration of the COVID-19 declaration under Section  564(b)(1) of the Act, 21 U.S.C. section 360bbb-3(b)(1), unless the authorization is terminated or revoked.  Performed at [facility], [address] [place], [place] [address]   Blood Culture (routine x 2)     Status: None (Preliminary result)   Collection Time: 04/04/21 11:10 AM  Specimen: BLOOD RIGHT FOREARM  Result Value Ref Range Status   Specimen Description BLOOD RIGHT FOREARM  Final   Special Requests   Final    BOTTLES DRAWN AEROBIC AND ANAEROBIC Blood Culture results may not be optimal due to an inadequate volume of blood received in culture bottles   Culture   Final    NO GROWTH 2 DAYS Performed at [facility], [place] [address] [place], [place] [address]    Report Status PENDING  Incomplete     Medications:    aspirin  81 mg Oral q morning   atorvastatin  40 mg Oral QHS   enoxaparin (LOVENOX) injection  40 mg Subcutaneous Q24H   gabapentin  600 mg Oral TID   insulin aspart  0-15 Units Subcutaneous TID WC   insulin aspart  0-5 Units Subcutaneous QHS   insulin aspart  4 Units Subcutaneous TID WC   insulin glargine-yfgn  10 Units Subcutaneous QHS   metoCLOPramide  5 mg Oral TID AC & HS   midodrine  30 mg Oral TID with meals   morphine  15 mg Oral Q12H   pantoprazole  40 mg Oral BID   polyethylene glycol  17 g Oral q morning   QUEtiapine  25 mg Oral QHS   sertraline  100 mg Oral q morning   temazepam  30 mg Oral QHS   Continuous Infusions:  piperacillin-tazobactam (ZOSYN)  IV 3.375 g (04/07/21 0520)   vancomycin        LOS: 3 days   Charlynne Cousins  [facility]  04/07/2021, 7:55 AM

## 2021-04-07 NOTE — Sedation Documentation (Signed)
2 aspiration sites  1- lower lumbar, 10 ml red, clear fluid 2- lower thoracic, 3 ml, red, clear fluid

## 2021-04-07 NOTE — Procedures (Signed)
Interventional Radiology Procedure Note  Date of Procedure: 04/07/2021  Procedure: CT guided aspiration of lumbar fluid   Findings:  1. CT guided aspiration of lower lumbar fluid yielded 23ml of thin pink serosanguinous fluid  2. CT guided aspiration of upper lumbar fluid yielded 19ml of thin pink serosanguinous fluid    Complications: No immediate complications noted.   Estimated Blood Loss: minimal  Follow-up and Recommendations: 1. F/u cultures    Olive Bass, MD  Vascular & Interventional Radiology  04/07/2021 12:45 PM

## 2021-04-07 NOTE — Progress Notes (Signed)
Pt in IR on rounds this morning, will see what they can get out with perc drainage

## 2021-04-07 NOTE — Progress Notes (Signed)
°  Transition of Care [facility]) Screening Note   Patient Details  Name: Rosey Eide Date of Birth: 13-Apr-1950   Transition of Care [facility]) CM/SW Contact:    Darrold Span, RN Phone Number: 04/07/2021, 2:35 PM    Transition of Care Department [facility]) has reviewed patient and no TOC needs have been identified at this time. Note PT eval pending. We will continue to monitor patient advancement through interdisciplinary progression rounds. If [place] patient transition needs arise, please place a TOC consult.

## 2021-04-08 ENCOUNTER — Other Ambulatory Visit (HOSPITAL_COMMUNITY): Payer: Self-pay

## 2021-04-08 DIAGNOSIS — A419 Sepsis, unspecified organism: Secondary | ICD-10-CM

## 2021-04-08 DIAGNOSIS — R6520 Severe sepsis without septic shock: Secondary | ICD-10-CM | POA: Diagnosis not present

## 2021-04-08 LAB — GLUCOSE, CAPILLARY
Glucose-Capillary: 125 mg/dL — ABNORMAL HIGH (ref 70–99)
Glucose-Capillary: 131 mg/dL — ABNORMAL HIGH (ref 70–99)
Glucose-Capillary: 199 mg/dL — ABNORMAL HIGH (ref 70–99)
Glucose-Capillary: 185 mg/dL — ABNORMAL HIGH (ref 70–99)
Glucose-Capillary: 196 mg/dL — ABNORMAL HIGH (ref 70–99)

## 2021-04-08 LAB — BASIC METABOLIC PANEL
Anion gap: 11 (ref 5–15)
BUN: 13 mg/dL (ref 8–23)
CO2: 23 mmol/L (ref 22–32)
Calcium: 8.8 mg/dL — ABNORMAL LOW (ref 8.9–10.3)
Chloride: 103 mmol/L (ref 98–111)
Creatinine, Ser: 1.17 mg/dL — ABNORMAL HIGH (ref 0.44–1.00)
GFR, Estimated: 51 mL/min — ABNORMAL LOW (ref >60)
Glucose, Bld: 119 mg/dL — ABNORMAL HIGH (ref 70–99)
Potassium: 4.3 mmol/L (ref 3.5–5.1)
Sodium: 137 mmol/L (ref 135–145)

## 2021-04-08 MED ORDER — SODIUM CHLORIDE 0.9 % IV SOLN
600.0000 mg | Freq: Every day | INTRAVENOUS | Status: DC
  Administered 2021-04-08 – 2021-04-12 (×5): 600 mg via INTRAVENOUS
  Filled 2021-04-08 (×5): qty 12

## 2021-04-08 NOTE — TOC Benefit Eligibility Note (Signed)
Patient Product/process development scientist completed.    The patient is currently admitted and upon discharge could be taking Nuzyra 150 mg tablets.  The current 30 day co-pay is, $0.00.   The patient is insured through [employer] Medicare Part D     Roland Earl, CPhT Pharmacy Patient Advocate Specialist [facility] Patient Advocate Team Direct Number: [phone]  Fax: [phone]

## 2021-04-08 NOTE — Consult Note (Signed)
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 Neurosurgery consulted who recommended IR aspiration and MRI.  -MRI showed two contiguous loculated collection at the laminectomy defect measuring 3.4x 2.1x 6.9cm and along midline incision measuring 2.6cm x3.6cm x 8.8 cm.  -CT guided aspiration of lumbar fluid collection with Cx  showed gram stain +GNR  Recommendations:  -D/C vancomycin -Start  daptomycin(to avoid nephrotoxic effect of vanc+ pip-tazo) -Continue pip-tazo -Follow IR aspirate Cx  Microbiology:   Antibiotics: Pip-tazo 12/30-p Vancomycin 12/30-p  Cultures: Blood 12/30 NGTD    HPI: Tonya Baker is a 71 y.o. female with DM, diabetic neuropathy autonomic dysfunction on midodrine, Hx of spinal hardware associated infection SP I&D on 06/07/19 at [facility] treated with vancomycin and rifampin x 6 weeks followed by doxycycline for chronic suppression, admission for strep dysgalactiae bacteremia in September 2022 treated  with 2 weeks of antibiotics(Penicillin->amoxicillin), underwent revision of L5-S1 with extension of fusion from L4 on 03/10/21 due to severe back pain with Dr. Zada Finders admitted to sepsis 2/2  back subcutaneous abscess. She presented from nursing home for fever and back pain. On arrival to the ED, pt was hypotensive, wbc 11.2K. She was administered IVF, started on pip-tazo and vancomycin. CT showed 3.5x2.8cm subcutaneous abscess in the midline extending T11-L3. Neurosurgery consulted and recommended IR aspiration and MRI. MRI lumbar spine showed 2 contiguous loculated collection at laminectomy defect along lower midline incision. Pt underwent CT guided aspiration with IR on 04/07/21 with CX pending(gram stain showed GNR). ID engaged for antibiotic recommendations. Today, pt is resting in bed. She reports a fall a few days after her surgery. She resides at a nursing home. She reports she moved up from [place] to [place] as she has family in the area.    Review of Systems: Review of Systems  All other systems reviewed and are negative.  Past Medical History:  Diagnosis Date   Back pain    Obstructive sleep apnea    Type 2 diabetes mellitus with diabetic autonomic neuropathy, with long-term current use of insulin (HCC)     Social History   Tobacco Use   Smoking status: Former    Types: Cigarettes    Quit date: 1990    Years since quitting: 33.0   Smokeless tobacco: Never  Substance Use Topics   Alcohol use: Not Currently    Comment: socially   Drug use: Never    History reviewed. No pertinent family history. Scheduled Meds:  aspirin  81 mg Oral  q morning   atorvastatin  40 mg Oral QHS   enoxaparin (LOVENOX) injection  40 mg Subcutaneous Q24H   gabapentin  600 mg Oral TID   insulin aspart  0-15 Units Subcutaneous TID WC   insulin aspart  0-5 Units Subcutaneous QHS   insulin aspart  4 Units Subcutaneous TID WC   insulin glargine-yfgn  10 Units Subcutaneous QHS    metoCLOPramide  5 mg Oral TID AC & HS   midodrine  30 mg Oral TID with meals   morphine  15 mg Oral Q12H   pantoprazole  40 mg Oral BID   polyethylene glycol  17 g Oral q morning   QUEtiapine  25 mg Oral QHS   sertraline  100 mg Oral q morning   temazepam  30 mg Oral QHS   Continuous Infusions:  DAPTOmycin (CUBICIN)  IV     piperacillin-tazobactam (ZOSYN)  IV 3.375 g (04/08/21 1201)   PRN Meds:.acetaminophen **OR** acetaminophen, albuterol, HYDROmorphone (DILAUDID) injection, ondansetron **OR** ondansetron (ZOFRAN) IV, oxyCODONE Allergies  Allergen Reactions   Duloxetine Hcl Other (See Comments)     tremors   Cortisone Other (See Comments)    Unknown reaction   Hydrocortisone Other (See Comments)    Unknown reaction   Latex Other (See Comments)    Unknown reaction    OBJECTIVE: Blood pressure (!) 103/59, pulse 67, temperature 98.2 F (36.8 C), temperature source Oral, resp. rate 18, height 5\' 4"  (1.626 m), weight 86.2 kg, SpO2 92 %.  Physical Exam Constitutional:      Appearance: Normal appearance.  HENT:     Head: Normocephalic and atraumatic.     Right Ear: Tympanic membrane normal.     Left Ear: Tympanic membrane normal.     Nose: Nose normal.     Mouth/Throat:     Mouth: Mucous membranes are moist.  Eyes:     Extraocular Movements: Extraocular movements intact.     Conjunctiva/sclera: Conjunctivae normal.     Pupils: Pupils are equal, round, and reactive to light.  Cardiovascular:     Rate and Rhythm: Normal rate and regular rhythm.     Heart sounds: No murmur heard.   No friction rub. No gallop.  Pulmonary:     Effort: Pulmonary effort is normal.     Breath sounds: Normal breath sounds.  Abdominal:     General: Abdomen is flat.     Palpations: Abdomen is soft.  Musculoskeletal:        General: Normal range of motion.  Skin:    General: Skin is warm.     Comments: Back wound with some surrounding drainage, no erythema  Neurological:     General: No  focal deficit present.     Mental Status: She is alert and oriented to person, place, and time.  Psychiatric:        Mood and Affect: Mood normal.    Lab Results Lab Results  Component Value Date   WBC 5.7 04/07/2021   HGB 10.9 (L) 04/07/2021   HCT 36.1 04/07/2021   MCV 90.3 04/07/2021   PLT 147 (L) 04/07/2021    Lab Results  Component Value Date   CREATININE 1.17 (H) 04/08/2021   BUN 13 04/08/2021   NA 137 04/08/2021   K 4.3 04/08/2021   CL 103 04/08/2021   CO2 23 04/08/2021    Lab Results  Component Value Date   ALT 11 04/04/2021   AST 21 04/04/2021   ALKPHOS 185 (H) 04/04/2021  BILITOT 0.7 04/04/2021       Laurice Record, MD [facility] Infectious [facility] 04/08/2021, 1:45 PM

## 2021-04-08 NOTE — Care Management Important Message (Signed)
Important Message  Patient Details  Name: Maguire Killmer MRN: [id number] Date of Birth: 1950-05-25   Medicare Important Message Given:  Yes     Renie Ora 04/08/2021, 11:31 AM

## 2021-04-08 NOTE — Progress Notes (Signed)
[facility] PROGRESS NOTE    Progress Note  Tonya Baker  [id number] DOB: 01/17/1951 DOA: 04/04/2021 PCP: Garwin Brothers, MD     Brief Narrative:   Tonya Baker is an 71 y.o. 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 discharged on the ninth who started to have back pain the day prior to admission with a temperature of 102 in the ED was found to be hypotensive she was fluid resuscitated but remained confused CT scan of the abdomen pelvis showed Lory 2 x 7 x 4 by 10 subcutaneous abscess in the midline back extending from T11-L3 with a CT of the C-spine that showed persistent lucency along the S1 screw tract, there was a thick rim enhancing collection along the posterior soup acute soft tissue consistent with recent surgery measuring 3 x 2 cm.MRI lumbar spine showed 2 contiguous loculated collection at the laminectomy defect along the lower midline incision. IR has been consulted and the plan is to proceed with CT-guided aspiration of the fluid collection in the spine on 04/07/2021..   Significant studies: MRI of the thoracic spine showed postsurgical changes with visualization of fluid collection along the posterior surgical incision MRI of the lumbar spine showed 2 contiguous loculated collection at the laminectomy to me defect along with lower midline incision   Antibiotics: 04/04/2021 IV vancomycin 04/04/2021 IV Zosyn  Microbiology data: 04/05/2019 blood culture: Negative till date  Procedures: None  Assessment/Plan:   Severe sepsis due to abscess of her lumbar spine: She was started empirically on IV vancomycin and cefepime and fluid resuscitated. Blood cultures negative till date Status post CT-guided aspiration on 04/07/2021 cultures have been sent. Consult ID  Acute metabolic  encephalopathy: Secondary to sepsis, CT scan of the head showed no acute findings. Now resolved.  Insulin-dependent diabetes mellitus type 2: Blood glucose is well controlled. Continue long-acting insulin plus sliding scale.  Diabetic neuropathy, anxiety/depression: Neurontin has been resumed.  Autonomic dysfunction Resume midodrine.  Diabetic neuropathy: Resume gabapentin.  Hypokalemia: Replete orally recheck in the morning.  Acute thrombocytopenia: Recheck a CBC today question due to infectious etiology.   DVT prophylaxis: lovenox Family Communication:none Status is: Inpatient  Remains inpatient appropriate because: Severe sepsis   Code Status:     Code Status Orders  (From admission, onward)           Start     Ordered   04/04/21 1427  Full code  Continuous        04/04/21 1428           Code Status History     Date Active Date Inactive Code Status Order ID Comments User Context   03/10/2021 1756 03/14/2021 2212 Full Code [id number]  Judith Part, MD Inpatient   12/28/2020 2230 01/04/2021 0406 Full Code [id number]  Renee Pain, MD ED         IV Access:   Peripheral IV   Procedures and diagnostic studies:   CT ASPIRATION  Result Date: 04/07/2021 INDICATION: Lumbar fusion hardware, paralumbar fluid concern for infection EXAM: CT GUIDANCE NEEDLE PLACEMENT MEDICATIONS: None. ANESTHESIA/SEDATION: Moderate (conscious) sedation was employed during this procedure. A total of Versed 1 mg and Fentanyl 50 mcg was administered intravenously. Moderate Sedation Time: 18 minutes. The patient's level of consciousness and vital signs were monitored continuously by radiology nursing throughout the procedure under my direct supervision. FLUOROSCOPY TIME:  Fluoroscopy Time: N/a COMPLICATIONS: None immediate. PROCEDURE: Informed written consent was obtained from the patient after a thorough discussion of the procedural risks, benefits and alternatives. All  questions were addressed. Maximal Sterile Barrier Technique was utilized including caps, mask, sterile gowns, sterile gloves, sterile drape, hand hygiene and skin antiseptic. A timeout was performed prior to the initiation of the procedure. The patient was placed prone on the exam table. Limited CT of the lumbar spine and paralumbar soft tissues was performed for planning purposes. This again demonstrated paralumbar fluid collections. An upper and lower lumbar collection identified. Skin entry sites were marked, and the overlying skin was prepped and draped in a standard sterile fashion. Local analgesia was obtained with 1% lidocaine. Attention was first turned to the lower lumbar collection. Using intermittent CT fluoroscopy, a 19 gauge Yueh catheter was advanced towards the fluid collection. Approximately 10 mL of thin, pink serosanguineous fluid was aspirated. This was sent to the lab for further analysis. Attention was then turned to the more superior upper lumbar collection. Again using intermittent CT fluoroscopy, a 19 gauge Yueh catheter was advanced towards this fluid collection. A smaller volume of only approximately 2 mL of thin, pink serosanguineous fluid was aspirated. This was also sent to the lab for analysis. Sterile dressings were placed after hemostasis. The patient tolerated the procedure well without immediate complication. IMPRESSION: 1. Successful CT-guided aspiration of lower paralumbar fluid collection with removal of approximately 10 mL of thin, pink serosanguineous fluid with a sample sent to the lab for analysis. 2. Successful CT-guided aspiration of a second more superior upper paralumbar fluid collection with removal of approximately 2 mL of thin, pink serosanguineous fluid with a sample sent to the lab for analysis. Electronically Signed   By: Albin Felling M.D.   On: 04/07/2021 13:39     Medical Consultants:   None.   Subjective:    Tonya Baker relates her pain is  controlled.  Objective:    Vitals:   04/07/21 2106 04/08/21 0016 04/08/21 0404 04/08/21 0904  BP: 109/72 99/63 (!) 101/58 (!) 119/58  Pulse: 61 (!) 50 60 64  Resp: 18 16 16 17   Temp: 97.8 F (36.6 C) 97.9 F (36.6 C) (!) 97.4 F (36.3 C) (!) 97.4 F (36.3 C)  TempSrc: Oral Oral Oral Oral  SpO2: 93% 96% 95% 99%  Weight:      Height:       SpO2: 99 % O2 Flow Rate (L/min): 2 L/min   Intake/Output Summary (Last 24 hours) at 04/08/2021 0925 Last data filed at 04/08/2021 0905 Gross per 24 hour  Intake 683.71 ml  Output 1650 ml  Net -966.29 ml    Filed Weights   04/04/21 1029  Weight: 86.2 kg    Exam: General exam: In no acute distress. Respiratory system: Good air movement and clear to auscultation. Cardiovascular system: S1 & S2 heard, RRR. No JVD. Gastrointestinal system: Abdomen is nondistended, soft and nontender.  Extremities: No pedal edema. Skin: No rashes, lesions or ulcers Psychiatry: Judgement and insight appear normal. Mood & affect appropriate. Data Reviewed:    Labs: Basic Metabolic Panel: Recent Labs  Lab 04/04/21 1040 04/04/21 1331 04/05/21 0333 04/06/21 0931 04/07/21 0615 04/08/21 0319  NA 136 138 138 140 138 137  K 3.6 3.6 3.4* 4.4 3.9 4.3  CL 102  --  107 108 103 103  CO2 25  --  23 26 29 23   GLUCOSE 159*  --  105* 84 112* 119*  BUN  27*  --  22 13 12 13   CREATININE 1.46*  --  1.07* 1.07* 1.12* 1.17*  CALCIUM 8.7*  --  7.9* 8.7* 8.9 8.8*  PHOS  --   --   --  4.0  --   --     GFR Estimated Creatinine Clearance: 48.2 mL/min (A) (by C-G formula based on SCr of 1.17 mg/dL (H)). Liver Function Tests: Recent Labs  Lab 04/04/21 1040 04/06/21 0931  AST 21  --   ALT 11  --   ALKPHOS 185*  --   BILITOT 0.7  --   PROT 6.9  --   ALBUMIN 2.8* 2.5*    No results for input(s): LIPASE, AMYLASE in the last 168 hours. Recent Labs  Lab 04/04/21 1305  AMMONIA <10    Coagulation profile Recent Labs  Lab 04/04/21 1040 04/07/21 0615  INR  1.1 1.1    COVID-19 Labs  No results for input(s): DDIMER, FERRITIN, LDH, CRP in the last 72 hours.  Lab Results  Component Value Date   SARSCOV2NAA NEGATIVE 04/04/2021   SARSCOV2NAA NEGATIVE 03/13/2021   [place] NEGATIVE 03/10/2021   [place] NEGATIVE 01/02/2021    CBC: Recent Labs  Lab 04/04/21 1040 04/04/21 1331 04/05/21 0333 04/06/21 0931 04/07/21 0615  WBC 11.2*  --  4.3 5.0 5.7  NEUTROABS 9.6*  --   --  2.7  --   HGB 11.1* 10.9* 9.2* 11.2* 10.9*  HCT 35.4* 32.0* 29.8* 37.9 36.1  MCV 88.5  --  90.0 91.8 90.3  PLT 161  --  110* 145* 147*    Cardiac Enzymes: No results for input(s): CKTOTAL, CKMB, CKMBINDEX, TROPONINI in the last 168 hours. BNP (last 3 results) No results for input(s): PROBNP in the last 8760 hours. CBG: Recent Labs  Lab 04/07/21 1322 04/07/21 1630 04/07/21 2110 04/08/21 0541 04/08/21 0609  GLUCAP 90 179* 128* 131* 125*    D-Dimer: No results for input(s): DDIMER in the last 72 hours. Hgb A1c: No results for input(s): HGBA1C in the last 72 hours. Lipid Profile: No results for input(s): CHOL, HDL, LDLCALC, TRIG, CHOLHDL, LDLDIRECT in the last 72 hours. Thyroid function studies: No results for input(s): TSH, T4TOTAL, T3FREE, THYROIDAB in the last 72 hours.  Invalid input(s): FREET3  Anemia work up: No results for input(s): VITAMINB12, FOLATE, FERRITIN, TIBC, IRON, RETICCTPCT in the last 72 hours. Sepsis Labs: Recent Labs  Lab 04/04/21 1040 04/04/21 1814 04/05/21 0333 04/06/21 0931 04/07/21 0615  WBC 11.2*  --  4.3 5.0 5.7  LATICACIDVEN 2.0* 1.8  --   --   --     Microbiology Recent Results (from the past 240 hour(s))  Blood Culture (routine x 2)     Status: None (Preliminary result)   Collection Time: 04/04/21 10:40 AM   Specimen: BLOOD LEFT HAND  Result Value Ref Range Status   Specimen Description BLOOD LEFT HAND  Final   Special Requests   Final    BOTTLES DRAWN AEROBIC AND ANAEROBIC Blood Culture adequate  volume   Culture   Final    NO GROWTH 4 DAYS Performed at [facility], [place] [address], [place], [place] [address]    Report Status PENDING  Incomplete  Resp Panel by RT-PCR (Flu A&B, Covid) Nasopharyngeal Swab     Status: None   Collection Time: 04/04/21 11:00 AM   Specimen: Nasopharyngeal Swab; Nasopharyngeal(NP) swabs in vial transport medium  Result Value Ref Range Status   SARS Coronavirus 2 by RT PCR NEGATIVE  NEGATIVE Final    Comment: (NOTE) SARS-CoV-2 target nucleic acids are NOT DETECTED.  The SARS-CoV-2 RNA is generally detectable in upper respiratory specimens during the acute phase of infection. The lowest concentration of SARS-CoV-2 viral copies this assay can detect is 138 copies/mL. A negative result does not preclude SARS-Cov-2 infection and should not be used as the sole basis for treatment or other patient management decisions. A negative result may occur with  improper specimen collection/handling, submission of specimen other than nasopharyngeal swab, presence of viral mutation(s) within the areas targeted by this assay, and inadequate number of viral copies(<138 copies/mL). A negative result must be combined with clinical observations, patient history, and epidemiological information. The expected result is Negative.  Fact Sheet for Patients:  BloggerCourse.comhttps://www.fda.gov/media/152166/download  Fact Sheet for Healthcare Providers:  SeriousBroker.ithttps://www.fda.gov/media/152162/download  This test is no t yet approved or cleared by the [place] FDA and  has been authorized for detection and/or diagnosis of SARS-CoV-2 by FDA under an Emergency Use Authorization (EUA). This EUA will remain  in effect (meaning this test can be used) for the duration of the COVID-19 declaration under Section 564(b)(1) of the Act, 21 U.S.C.section 360bbb-3(b)(1), unless the authorization is terminated  or revoked sooner.       Influenza A by PCR NEGATIVE NEGATIVE Final   Influenza B by  PCR NEGATIVE NEGATIVE Final    Comment: (NOTE) The Xpert Xpress SARS-CoV-2/FLU/RSV plus assay is intended as an aid in the diagnosis of influenza from Nasopharyngeal swab specimens and should not be used as a sole basis for treatment. Nasal washings and aspirates are unacceptable for Xpert Xpress SARS-CoV-2/FLU/RSV testing.  Fact Sheet for Patients: BloggerCourse.comhttps://www.fda.gov/media/152166/download  Fact Sheet for Healthcare Providers: SeriousBroker.ithttps://www.fda.gov/media/152162/download  This test is not yet approved or cleared by the [place] FDA and has been authorized for detection and/or diagnosis of SARS-CoV-2 by FDA under an Emergency Use Authorization (EUA). This EUA will remain in effect (meaning this test can be used) for the duration of the COVID-19 declaration under Section 564(b)(1) of the Act, 21 U.S.C. section 360bbb-3(b)(1), unless the authorization is terminated or revoked.  Performed at [facility], [address] [place], [place] [address]   Blood Culture (routine x 2)     Status: None (Preliminary result)   Collection Time: 04/04/21 11:10 AM   Specimen: BLOOD RIGHT FOREARM  Result Value Ref Range Status   Specimen Description BLOOD RIGHT FOREARM  Final   Special Requests   Final    BOTTLES DRAWN AEROBIC AND ANAEROBIC Blood Culture results may not be optimal due to an inadequate volume of blood received in culture bottles   Culture   Final    NO GROWTH 4 DAYS Performed at [facility], [address] [place], [place] [address]    Report Status PENDING  Incomplete  Aerobic/Anaerobic Culture w Gram Stain (surgical/deep wound)     Status: None (Preliminary result)   Collection Time: 04/07/21 12:48 PM   Specimen: Back; Wound  Result Value Ref Range Status   Specimen Description BACK LOWER LUMBAR  Final   Special Requests NONE  Final   Gram Stain   Final    MODERATE WBC PRESENT, PREDOMINANTLY MONONUCLEAR RARE GRAM NEGATIVE RODS Performed at [facility], [address] [place], [place] [address]    Culture PENDING  Incomplete   Report Status PENDING  Incomplete  Aerobic/Anaerobic Culture w Gram Stain (surgical/deep wound)     Status: None (Preliminary result)  Collection Time: 04/07/21 12:49 PM   Specimen: Back; Wound  Result Value Ref Range Status   Specimen Description BACK UPPER LUMBAR  Final   Special Requests NONE  Final   Gram Stain   Final    FEW WBC SEEN NO ORGANISMS SEEN Performed at [facility], [address] [place], [place] [address]    Culture PENDING  Incomplete   Report Status PENDING  Incomplete     Medications:    aspirin  81 mg Oral q morning   atorvastatin  40 mg Oral QHS   enoxaparin (LOVENOX) injection  40 mg Subcutaneous Q24H   gabapentin  600 mg Oral TID   insulin aspart  0-15 Units Subcutaneous TID WC   insulin aspart  0-5 Units Subcutaneous QHS   insulin aspart  4 Units Subcutaneous TID WC   insulin glargine-yfgn  10 Units Subcutaneous QHS   metoCLOPramide  5 mg Oral TID AC & HS   midodrine  30 mg Oral TID with meals   morphine  15 mg Oral Q12H   pantoprazole  40 mg Oral BID   polyethylene glycol  17 g Oral q morning   QUEtiapine  25 mg Oral QHS   sertraline  100 mg Oral q morning   temazepam  30 mg Oral QHS   Continuous Infusions:  piperacillin-tazobactam (ZOSYN)  IV 3.375 g (04/08/21 0410)   vancomycin 750 mg (04/07/21 1504)      LOS: 4 days   Charlynne Cousins  [facility]  04/08/2021, 9:25 AM

## 2021-04-08 NOTE — Progress Notes (Signed)
Neurosurgery Service Progress Note  Subjective: No acute events overnight, back pain at baseline (improved from preop), no radicular symptoms   Objective: Vitals:   04/08/21 0016 04/08/21 0404 04/08/21 0904 04/08/21 1111  BP: 99/63 (!) 101/58 (!) 119/58 (!) 103/59  Pulse: (!) 50 60 64 67  Resp: 16 16 17 18   Temp: 97.9 F (36.6 C) (!) 97.4 F (36.3 C) (!) 97.4 F (36.3 C) 98.2 F (36.8 C)  TempSrc: Oral Oral Oral Oral  SpO2: 96% 95% 99% 92%  Weight:      Height:        Physical Exam: Strength 5/5 x4 except (improved) chronic right foot drop with 4/5 in R EHL/DF and 4+/5 in PF, SILT except bilateral stocking numbness Incision c/d/I, dressing taken down and replaced, no [place] erythema or induration, no drainage  Assessment & Plan: 71 y.o. woman s/p revision of lumbosacral pseudoarthrosis with post-op readmission for sepsis picture. CT with hardware in good position and post-op fluid collection. MRI L-spine w/ no evidence of discitis / osteomyelitis / epidural abscess most c/w post-op seroma. 1/2 s/p IR drainage of collection, no purulence but +gram stain for GNRs.  -wound is healing well, the appearance of the fluid from the aspiration and the imaging findings are consistent with a post-op seroma. Would be atypical but certainly impossible to have a post-op infection without any findings in the wound / inflammatory changes on MRI, but given her multiple prior admissions for gram negative bacteremia and the gram stain findings, unclear cause. Given that it has been drained and there was no purulence, along with the MRI findings lacking evidence of an abscess, I do not think there is an indication to reopen her wound. With her comorbidities, the fact that it's currently healing is a big win. Will continue to follow and make sure it continues to go in the right direction.   71  04/08/21 3:51 PM

## 2021-04-08 NOTE — Progress Notes (Signed)
PT Cancellation Note  Patient Details Name: Intisar Claudio MRN: [id number] DOB: Mar 02, 1951   Cancelled Treatment:    Reason Eval/Treat Not Completed: Other (comment).  Awaiting neurosurgery consult and will retry when information is updated.   Ivar Drape 04/08/2021, 10:49 AM  Samul Dada, PT PhD Acute Rehab Dept. Number: [facility] [id number] and [facility] [phone]

## 2021-04-09 DIAGNOSIS — G061 Intraspinal abscess and granuloma: Secondary | ICD-10-CM | POA: Diagnosis not present

## 2021-04-09 DIAGNOSIS — A419 Sepsis, unspecified organism: Secondary | ICD-10-CM | POA: Diagnosis not present

## 2021-04-09 DIAGNOSIS — Z794 Long term (current) use of insulin: Secondary | ICD-10-CM | POA: Diagnosis not present

## 2021-04-09 DIAGNOSIS — E119 Type 2 diabetes mellitus without complications: Secondary | ICD-10-CM | POA: Diagnosis not present

## 2021-04-09 DIAGNOSIS — G9341 Metabolic encephalopathy: Secondary | ICD-10-CM | POA: Diagnosis not present

## 2021-04-09 LAB — GLUCOSE, CAPILLARY
Glucose-Capillary: 179 mg/dL — ABNORMAL HIGH (ref 70–99)
Glucose-Capillary: 192 mg/dL — ABNORMAL HIGH (ref 70–99)
Glucose-Capillary: 132 mg/dL — ABNORMAL HIGH (ref 70–99)
Glucose-Capillary: 134 mg/dL — ABNORMAL HIGH (ref 70–99)

## 2021-04-09 LAB — BASIC METABOLIC PANEL
Anion gap: 9 (ref 5–15)
BUN: 14 mg/dL (ref 8–23)
CO2: 28 mmol/L (ref 22–32)
Calcium: 9 mg/dL (ref 8.9–10.3)
Chloride: 102 mmol/L (ref 98–111)
Creatinine, Ser: 1.14 mg/dL — ABNORMAL HIGH (ref 0.44–1.00)
GFR, Estimated: 52 mL/min — ABNORMAL LOW (ref >60)
Glucose, Bld: 168 mg/dL — ABNORMAL HIGH (ref 70–99)
Potassium: 4.9 mmol/L (ref 3.5–5.1)
Sodium: 139 mmol/L (ref 135–145)

## 2021-04-09 LAB — CULTURE, BLOOD (ROUTINE X 2)
Culture: NO GROWTH
Culture: NO GROWTH
Special Requests: ADEQUATE

## 2021-04-09 LAB — CK: Total CK: 14 U/L — ABNORMAL LOW (ref 38–234)

## 2021-04-09 NOTE — Progress Notes (Signed)
[facility] PROGRESS NOTE    Progress Note  Tonya Baker  [id number] DOB: [birth year] DOA: 04/04/2021 PCP: Garwin Brothers, MD     Brief Narrative:   Tonya Baker is an 71 y.o. 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 discharged on the ninth who started to have back pain the day prior to admission with a temperature of 102 in the ED was found to be hypotensive she was fluid resuscitated but remained confused CT scan of the abdomen pelvis showed Alonzo 2 x 7 x 4 by 10 subcutaneous abscess in the midline back extending from T11-L3 with a CT of the C-spine that showed persistent lucency along the S1 screw tract, there was a thick rim enhancing collection along the posterior soup acute soft tissue consistent with recent surgery measuring 3 x 2 cm.MRI lumbar spine showed 2 contiguous loculated collection at the laminectomy defect along the lower midline incision. IR has been consulted and the plan is to proceed with CT-guided aspiration of the fluid collection in the spine on 04/07/2021..   Significant studies: MRI of the thoracic spine showed postsurgical changes with visualization of fluid collection along the posterior surgical incision MRI of the lumbar spine showed 2 contiguous loculated collection at the laminectomy to me defect along with lower midline incision   Antibiotics: 04/04/2021 IV vancomycin 04/04/2021 IV Zosyn  Microbiology data: 04/05/2019 blood culture: Negative till date  Procedures: CT guided aspiration of lumber fluids  Assessment/Plan:   Severe sepsis due to abscess of her lumbar spine: She was started empirically on IV vancomycin and cefepime and fluid resuscitated. Blood cultures negative till date Status post CT-guided aspiration on 04/07/2021 cultures have been sent. ID  consulted, will follow recommendations  Acute metabolic encephalopathy: Secondary to sepsis, CT scan of the head showed no acute findings. Now resolved.  Insulin-dependent diabetes mellitus type 2: Blood glucose is well controlled. Continue long-acting insulin plus sliding scale.  Diabetic neuropathy, anxiety/depression: Neurontin has been resumed.  Autonomic dysfunction Resume midodrine.  Diabetic neuropathy: Resume gabapentin.  Hypokalemia: Replaced   Acute thrombocytopenia: Recheck a CBC today question due to infectious etiology. Improving, monitor   DVT prophylaxis: lovenox Family Communication:none Status is: Inpatient  Remains inpatient appropriate because: Severe sepsis   Code Status:     Code Status Orders  (From admission, onward)           Start     Ordered   04/04/21 1427  Full code  Continuous        04/04/21 1428           Code Status History     Date Active Date Inactive Code Status Order ID Comments User Context   03/10/2021 1756 03/14/2021 2212 Full Code [id number]  Judith Part, MD Inpatient   12/28/2020 2230 01/04/2021 0406 Full Code [id number]  Renee Pain, MD ED         IV Access:   Peripheral IV   Procedures and diagnostic studies:   No results found.   Medical Consultants:   None.   Subjective:    Tonya Baker is seen together with RN at bedside  She relates her pain is controlled. No fever, reports + bm  Objective:    Vitals:   04/08/21 2345 04/09/21 0411 04/09/21 0806 04/09/21 1121  BP: (!) 94/59 107/63 (!) 148/65 112/64  Pulse: 60 62 65  75  Resp: 18 18 18 17   Temp: 97.9 F (36.6 C) 97.7 F (36.5 C) 97.7 F (36.5 C) 97.8 F (36.6 C)  TempSrc: Oral Oral Oral Oral  SpO2: 98% 97% 100% 100%  Weight:      Height:       SpO2: 100 % O2 Flow Rate (L/min): 2 L/min   Intake/Output Summary (Last 24 hours) at 04/09/2021 1340 Last data filed at 04/09/2021 1335 Gross per 24 hour  Intake 812.01 ml   Output 3200 ml  Net -2387.99 ml   Filed Weights   04/04/21 1029  Weight: 86.2 kg    Exam: General exam: In no acute distress. Respiratory system: Good air movement and clear to auscultation. Cardiovascular system: S1 & S2 heard, RRR. No JVD. Gastrointestinal system: Abdomen is nondistended, soft and nontender.  Extremities: No pedal edema. Skin: No rashes, lesions or ulcers Psychiatry: Judgement and insight appear normal. Mood & affect appropriate. Data Reviewed:    Labs: Basic Metabolic Panel: Recent Labs  Lab 04/05/21 0333 04/06/21 0931 04/07/21 0615 04/08/21 0319 04/09/21 0508  NA 138 140 138 137 139  K 3.4* 4.4 3.9 4.3 4.9  CL 107 108 103 103 102  CO2 23 26 29 23 28   GLUCOSE 105* 84 112* 119* 168*  BUN 22 13 12 13 14   CREATININE 1.07* 1.07* 1.12* 1.17* 1.14*  CALCIUM 7.9* 8.7* 8.9 8.8* 9.0  PHOS  --  4.0  --   --   --    GFR Estimated Creatinine Clearance: 49.5 mL/min (A) (by C-G formula based on SCr of 1.14 mg/dL (H)). Liver Function Tests: Recent Labs  Lab 04/04/21 1040 04/06/21 0931  AST 21  --   ALT 11  --   ALKPHOS 185*  --   BILITOT 0.7  --   PROT 6.9  --   ALBUMIN 2.8* 2.5*   No results for input(s): LIPASE, AMYLASE in the last 168 hours. Recent Labs  Lab 04/04/21 1305  AMMONIA <10   Coagulation profile Recent Labs  Lab 04/04/21 1040 04/07/21 0615  INR 1.1 1.1   COVID-19 Labs  No results for input(s): DDIMER, FERRITIN, LDH, CRP in the last 72 hours.  Lab Results  Component Value Date   SARSCOV2NAA NEGATIVE 04/04/2021   SARSCOV2NAA NEGATIVE 03/13/2021   [place] NEGATIVE 03/10/2021   [place] NEGATIVE 01/02/2021    CBC: Recent Labs  Lab 04/04/21 1040 04/04/21 1331 04/05/21 0333 04/06/21 0931 04/07/21 0615  WBC 11.2*  --  4.3 5.0 5.7  NEUTROABS 9.6*  --   --  2.7  --   HGB 11.1* 10.9* 9.2* 11.2* 10.9*  HCT 35.4* 32.0* 29.8* 37.9 36.1  MCV 88.5  --  90.0 91.8 90.3  PLT 161  --  110* 145* 147*   Cardiac  Enzymes: Recent Labs  Lab 04/09/21 0508  CKTOTAL 14*   BNP (last 3 results) No results for input(s): PROBNP in the last 8760 hours. CBG: Recent Labs  Lab 04/08/21 1109 04/08/21 1644 04/08/21 2207 04/09/21 0614 04/09/21 1120  GLUCAP 199* 185* 196* 179* 192*   D-Dimer: No results for input(s): DDIMER in the last 72 hours. Hgb A1c: No results for input(s): HGBA1C in the last 72 hours. Lipid Profile: No results for input(s): CHOL, HDL, LDLCALC, TRIG, CHOLHDL, LDLDIRECT in the last 72 hours. Thyroid function studies: No results for input(s): TSH, T4TOTAL, T3FREE, THYROIDAB in the last 72 hours.  Invalid input(s): FREET3  Anemia work up: No results for input(s): VITAMINB12, FOLATE, FERRITIN,  TIBC, IRON, RETICCTPCT in the last 72 hours. Sepsis Labs: Recent Labs  Lab 04/04/21 1040 04/04/21 1814 04/05/21 0333 04/06/21 0931 04/07/21 0615  WBC 11.2*  --  4.3 5.0 5.7  LATICACIDVEN 2.0* 1.8  --   --   --    Microbiology Recent Results (from the past 240 hour(s))  Blood Culture (routine x 2)     Status: None   Collection Time: 04/04/21 10:40 AM   Specimen: BLOOD LEFT HAND  Result Value Ref Range Status   Specimen Description BLOOD LEFT HAND  Final   Special Requests   Final    BOTTLES DRAWN AEROBIC AND ANAEROBIC Blood Culture adequate volume   Culture   Final    NO GROWTH 5 DAYS Performed at [facility], [address] [place], [place] [address]    Report Status 04/09/2021 FINAL  Final  Resp Panel by RT-PCR (Flu A&B, Covid) Nasopharyngeal Swab     Status: None   Collection Time: 04/04/21 11:00 AM   Specimen: Nasopharyngeal Swab; Nasopharyngeal(NP) swabs in vial transport medium  Result Value Ref Range Status   SARS Coronavirus 2 by RT PCR NEGATIVE NEGATIVE Final    Comment: (NOTE) SARS-CoV-2 target nucleic acids are NOT DETECTED.  The SARS-CoV-2 RNA is generally detectable in upper respiratory specimens during the acute phase of infection. The  lowest concentration of SARS-CoV-2 viral copies this assay can detect is 138 copies/mL. A negative result does not preclude SARS-Cov-2 infection and should not be used as the sole basis for treatment or other patient management decisions. A negative result may occur with  improper specimen collection/handling, submission of specimen other than nasopharyngeal swab, presence of viral mutation(s) within the areas targeted by this assay, and inadequate number of viral copies(<138 copies/mL). A negative result must be combined with clinical observations, patient history, and epidemiological information. The expected result is Negative.  Fact Sheet for Patients:  EntrepreneurPulse.com.au  Fact Sheet for Healthcare Providers:  IncredibleEmployment.be  This test is no t yet approved or cleared by the [place] FDA and  has been authorized for detection and/or diagnosis of SARS-CoV-2 by FDA under an Emergency Use Authorization (EUA). This EUA will remain  in effect (meaning this test can be used) for the duration of the COVID-19 declaration under Section 564(b)(1) of the Act, 21 U.S.C.section 360bbb-3(b)(1), unless the authorization is terminated  or revoked sooner.       Influenza A by PCR NEGATIVE NEGATIVE Final   Influenza B by PCR NEGATIVE NEGATIVE Final    Comment: (NOTE) The Xpert Xpress SARS-CoV-2/FLU/RSV plus assay is intended as an aid in the diagnosis of influenza from Nasopharyngeal swab specimens and should not be used as a sole basis for treatment. Nasal washings and aspirates are unacceptable for Xpert Xpress SARS-CoV-2/FLU/RSV testing.  Fact Sheet for Patients: EntrepreneurPulse.com.au  Fact Sheet for Healthcare Providers: IncredibleEmployment.be  This test is not yet approved or cleared by the [place] FDA and has been authorized for detection and/or diagnosis of SARS-CoV-2 by FDA under  an Emergency Use Authorization (EUA). This EUA will remain in effect (meaning this test can be used) for the duration of the COVID-19 declaration under Section 564(b)(1) of the Act, 21 U.S.C. section 360bbb-3(b)(1), unless the authorization is terminated or revoked.  Performed at [facility], [place] [address], [place], [place] [address]   Blood Culture (routine x 2)     Status: None   Collection Time: 04/04/21 11:10 AM   Specimen: BLOOD RIGHT  FOREARM  Result Value Ref Range Status   Specimen Description BLOOD RIGHT FOREARM  Final   Special Requests   Final    BOTTLES DRAWN AEROBIC AND ANAEROBIC Blood Culture results may not be optimal due to an inadequate volume of blood received in culture bottles   Culture   Final    NO GROWTH 5 DAYS Performed at [facility], [place] [address] [place], [place] [address]    Report Status 04/09/2021 FINAL  Final  Aerobic/Anaerobic Culture w Gram Stain (surgical/deep wound)     Status: None (Preliminary result)   Collection Time: 04/07/21 12:48 PM   Specimen: Back; Wound  Result Value Ref Range Status   Specimen Description BACK LOWER LUMBAR  Final   Special Requests NONE  Final   Gram Stain   Final    MODERATE WBC PRESENT, PREDOMINANTLY MONONUCLEAR RARE GRAM NEGATIVE RODS    Culture   Final    NO GROWTH 2 DAYS Performed at [facility], [address], [place], [place] [address]    Report Status PENDING  Incomplete  Aerobic/Anaerobic Culture w Gram Stain (surgical/deep wound)     Status: None (Preliminary result)   Collection Time: 04/07/21 12:49 PM   Specimen: Back; Wound  Result Value Ref Range Status   Specimen Description BACK UPPER LUMBAR  Final   Special Requests NONE  Final   Gram Stain FEW WBC SEEN NO ORGANISMS SEEN   Final   Culture   Final    NO GROWTH 2 DAYS Performed at [facility], [address] [place], [place] [address]    Report Status PENDING  Incomplete     Medications:    aspirin  81  mg Oral q morning   atorvastatin  40 mg Oral QHS   enoxaparin (LOVENOX) injection  40 mg Subcutaneous Q24H   gabapentin  600 mg Oral TID   insulin aspart  0-15 Units Subcutaneous TID WC   insulin aspart  0-5 Units Subcutaneous QHS   insulin aspart  4 Units Subcutaneous TID WC   insulin glargine-yfgn  10 Units Subcutaneous QHS   metoCLOPramide  5 mg Oral TID AC & HS   midodrine  30 mg Oral TID with meals   morphine  15 mg Oral Q12H   pantoprazole  40 mg Oral BID   polyethylene glycol  17 g Oral q morning   QUEtiapine  25 mg Oral QHS   sertraline  100 mg Oral q morning   temazepam  30 mg Oral QHS   Continuous Infusions:  DAPTOmycin (CUBICIN)  IV 600 mg (04/08/21 2045)   piperacillin-tazobactam (ZOSYN)  IV 3.375 g (04/09/21 1332)      LOS: 5 days   Florencia Reasons MD PhD FACP [facility]  04/09/2021, 1:40 PM

## 2021-04-09 NOTE — Evaluation (Signed)
Occupational Therapy Evaluation Patient Details Name: Tonya Baker MRN: [id number] DOB: [birth year] Today's Date: 04/09/2021   History of Present Illness 71 y.o. woman s/p revision of lumbosacral pseudoarthrosis with post-op readmission for sepsis picture. CT with hardware in good position and post-op fluid collection. MRI L-spine w/ no evidence of discitis / osteomyelitis / epidural abscess most c/w post-op seroma. 1/2 s/p IR drainage of collection, no purulence but +gram stain for GNRs. PMH: IIDM, diabetic neuropathy, orthostatic hypotension on midodrine, HTN, HLD, obesity   Clinical Impression   [facility] from back surgery. Admitted for above and limited by problem list below, including generalized weakness, decreased activity tolerance, impaired balance and back pain. She reports knowing she is at the hospital but doesn't remember why she came initially; she is able to recall 1/3 back precautions without cueing.  She does report at SNF was able to bathe and dress self, using RW for mobility with assist.  She currently requires min assist for ADLs and bed mobility, deferred transfers at this time due to pt reports just returned back to bed.  Believe she will benefit from further OT services while admitted and after dc at SNF level to optimize independence with ADLs and mobility.  Will follow.      Recommendations for follow up therapy are one component of a multi-disciplinary discharge planning process, led by the attending physician.  Recommendations may be updated based on patient status, additional functional criteria and insurance authorization.   Follow Up Recommendations  Skilled nursing-short term rehab (<3 hours/day)    Assistance Recommended at Discharge Frequent or constant Supervision/Assistance  Patient can return home with the following      Functional Status Assessment  Patient has had a recent decline in their functional status and  demonstrates the ability to make significant improvements in function in a reasonable and predictable amount of time.  Equipment Recommendations  Other (comment) (defer to next venue)    Recommendations for Other Services       Precautions / Restrictions Precautions Precautions: Fall;Back Precaution Comments: able to recall 1/3 precautions and cueing to adhere functionally- reviewed all with pt Other Brace: No brace Restrictions Weight Bearing Restrictions: No      Mobility Bed Mobility Overal bed mobility: Needs Assistance Bed Mobility: Rolling;Sidelying to Sit Rolling: Min assist Sidelying to sit: Min assist       General bed mobility comments: cueing for technique, back precautions, + rail    Transfers       Balance Overall balance assessment: Needs assistance Sitting-balance support: Feet supported;No upper extremity supported;Bilateral upper extremity supported;Single extremity supported Sitting balance-Leahy Scale: Fair     Standing balance support: Bilateral upper extremity supported;Reliant on assistive device for balance;During functional activity Standing balance-Leahy Scale: Poor Standing balance comment: min A for balance                           ADL either performed or assessed with clinical judgement   ADL Overall ADL's : Needs assistance/impaired     Grooming: Set up;Sitting   Upper Body Bathing: Minimal assistance;Sitting   Lower Body Bathing: Minimal assistance;Sitting/lateral leans   Upper Body Dressing : Minimal assistance;Sitting   Lower Body Dressing: Minimal assistance;Sit to/from Market researcherstand     Toilet Transfer Details (indicate cue type and reason): declined OOB, just returned back to bed         Functional mobility during ADLs: Minimal assistance (limited to EOB)  Vision   Vision Assessment?: No apparent visual deficits (further assessment)     Perception     Praxis      Pertinent Vitals/Pain Pain  Assessment: Faces Pain Score: 7  Faces Pain Scale: Hurts little more Pain Location: back Pain Descriptors / Indicators: Grimacing;Guarding;Discomfort;Radiating;Throbbing Pain Intervention(s): Limited activity within patient's tolerance;Monitored during session;Repositioned     Hand Dominance Right   Extremity/Trunk Assessment Upper Extremity Assessment Upper Extremity Assessment: Generalized weakness LUE Coordination: decreased gross motor;decreased fine motor   Lower Extremity Assessment Lower Extremity Assessment: RLE deficits/detail;LLE deficits/detail RLE Deficits / Details: AROM WFL, strenght grossly 3+/5 RLE Sensation: history of peripheral neuropathy LLE Deficits / Details: PROM WFL, hip strength 2/5, knee 3+/5, ankle plantar 4/5, dorsi 3/5 LLE Sensation: history of peripheral neuropathy;decreased proprioception;decreased light touch LLE Coordination: decreased fine motor;decreased gross motor   Cervical / Trunk Assessment Cervical / Trunk Assessment: Back Surgery Cervical / Trunk Exceptions: body habitus   Communication Communication Communication: No difficulties   Cognition Arousal/Alertness: Awake/alert Behavior During Therapy: WFL for tasks assessed/performed Overall Cognitive Status: Impaired/Different from baseline Area of Impairment: Memory;Problem solving                 Orientation Level: Time   Memory: Decreased short-term memory;Decreased recall of precautions Following Commands: Follows multi-step commands with increased time     Problem Solving: Slow processing;Requires verbal cues;Requires tactile cues General Comments: Pt has no memory of coming to hospital, is aware of current situation.  Poor recall of back precautions.  Requires cueing for problem solving.     General Comments  VSS on RA    Exercises     Shoulder Instructions      Home Living Family/patient expects to be discharged to:: Skilled nursing facility                                  Additional Comments: [facility] per chart review      Prior Functioning/Environment Prior Level of Function : Needs assist       Physical Assist : Mobility (physical);ADLs (physical) Mobility (physical): Gait ADLs (physical): IADLs Mobility Comments: Pt utilizing RW for bouts of 50 ft of ambulation ADLs Comments: able to perform bathing and dressing        OT Problem List: Decreased strength;Decreased activity tolerance;Impaired balance (sitting and/or standing);Pain;Obesity;Decreased knowledge of use of DME or AE;Decreased safety awareness;Decreased cognition      OT Treatment/Interventions: Self-care/ADL training;Therapeutic exercise;DME and/or AE instruction;Therapeutic activities;Patient/family education;Balance training;Cognitive remediation/compensation    OT Goals(Current goals can be found in the care plan section) Acute Rehab OT Goals Patient Stated Goal: get better OT Goal Formulation: With patient Time For Goal Achievement: 04/23/21 Potential to Achieve Goals: Good  OT Frequency: Min 2X/week    Co-evaluation              AM-PAC OT "6 Clicks" Daily Activity     Outcome Measure Help from another person eating meals?: A Little Help from another person taking care of personal grooming?: A Little Help from another person toileting, which includes using toliet, bedpan, or urinal?: Total Help from another person bathing (including washing, rinsing, drying)?: A Little Help from another person to put on and taking off regular upper body clothing?: A Little Help from another person to put on and taking off regular lower body clothing?: A Little 6 Click Score: 16   End of Session Nurse Communication: Mobility status  Activity Tolerance: Patient tolerated treatment well Patient left: in bed;with call bell/phone within reach;with bed alarm set  OT Visit Diagnosis: Unsteadiness on feet (R26.81);Other abnormalities of gait and mobility  (R26.89);Muscle weakness (generalized) (M62.81)                Time: 8675-4492 OT Time Calculation (min): 13 min Charges:  OT General Charges $OT Visit: 1 Visit OT Evaluation $OT Eval Moderate Complexity: 1 Mod  Barry Brunner, OT Acute Rehabilitation Services Pager [phone] Office [phone]   Chancy Milroy 04/09/2021, 1:27 PM

## 2021-04-09 NOTE — TOC Initial Note (Signed)
Transition of Care [facility]) - Initial/Assessment Note    Patient Details  Name: Tonya Baker MRN: [id number] Date of Birth: 20-May-1950  Transition of [facility]) CM/SW Contact:    Vinie Sill, LCSW Phone Number: 04/09/2021, 5:17 PM  Clinical Narrative:                  CSW spoke with patient via phone- CSW introduced self and explained role. Patient confirmed she is from [facility]. Patient is agreeable to returning once medically stable.   [facility]- confirmed patient is LTC. CSW advised therapy is recommended.   CSW will continue to follow and assist with discharge planning.  Thurmond Butts, MSW, LCSW Clinical Social Worker    Expected Discharge Plan: Skilled Nursing Facility Barriers to Discharge: Continued Medical Work up   Patient Goals and CMS Choice        Expected Discharge Plan and Services Expected Discharge Plan: [place] In-house Referral: Clinical Social Work     Living arrangements for the past 2 months: [place]                                      Prior Living Arrangements/Services Living arrangements for the past 2 months: [place]   Patient language and need for interpreter reviewed:: No        Need for Family Participation in Patient Care: Yes (Comment) Care giver support system in place?: Yes (comment)   Criminal Activity/Legal Involvement Pertinent to Current Situation/Hospitalization: No - Comment as needed  Activities of Daily Living      Permission Sought/Granted Permission sought to share information with : Family Supports Permission granted to share information with : Yes, Verbal Permission Granted  Share Information with NAME: Massey,[name]  Permission granted to share info w AGENCY: SNFs  Permission granted to share info w Relationship: niece  Permission granted to share info w Contact Information: [phone]  Emotional Assessment     Affect  (typically observed): Accepting, Pleasant Orientation: : Oriented to Self, Oriented to Place, Oriented to  Time, Oriented to Situation Alcohol / Substance Use: Not Applicable Psych Involvement: No (comment)  Admission diagnosis:  Abscess of back [L02.212] Sepsis [facility]) [A41.9] Patient Active Problem List   Diagnosis Date Noted   Abscess in epidural space of L2-L5 lumbar spine [id number]   Acute metabolic encephalopathy Q000111Q   Diabetic neuropathy ([place]) 04/05/2021   Abscess of back    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   Bacteremia due to Streptococcus    Volume overload [id number]   Hardware complicating wound infection ([place])    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

## 2021-04-09 NOTE — Progress Notes (Signed)
Tonya Baker for Infectious Disease  Date of Admission:  04/04/2021   Total days of inpatient antibiotics 5  Principal Problem:   Sepsis ([place]) Active Problems:   CKD (chronic kidney disease) stage 3, GFR 30-59 ml/min (HCC)   Obstructive sleep apnea   Type 2 diabetes mellitus with diabetic neuropathy, with long-term current use of insulin (HCC)   Abscess in epidural space of L2-L5 lumbar spine   Acute metabolic encephalopathy   Diabetic neuropathy (HCC)          Assessment: 56 YF with DM, spinal hardware infection with MRSA SP I&D on 06/07/19 on chronic suppression wit doxycyline, recently underwent L5-S1 revision with fusion extension on 03/10/21 admitted for sepsis 2/2 back abscess.   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 Neurosurgery consulted who recommended IR aspiration and MRI.  -MRI showed two contiguous loculated collection at the laminectomy defect measuring 3.4x 2.1x 6.9cm and along midline incision measuring 2.6cm x3.6cm x 8.8 cm.  -CT guided aspiration of lumbar fluid collection with Cx  showed gram stain +GNR   Recommendations:  -Continue daptomycin and pip-tazo -Follow IR aspirate Cx  Microbiology:   Antibiotics: Pip-tazo 12/30-p Vancomycin 12/30-04/08/21 Daptomycin 04/08/21-p   Cultures: Blood 12/30 NGTD  SUBJECTIVE: Resting in bed. No Diekman complaints. No significant overnight events.   Interval: Afebrile overnight.   Review of Systems: Review of Systems  All other systems reviewed and are negative.   Scheduled Meds:  aspirin  81 mg Oral q morning   atorvastatin  40 mg Oral QHS   enoxaparin (LOVENOX) injection  40 mg Subcutaneous Q24H   gabapentin  600 mg Oral TID   insulin aspart  0-15 Units  Subcutaneous TID WC   insulin aspart  0-5 Units Subcutaneous QHS   insulin aspart  4 Units Subcutaneous TID WC   insulin glargine-yfgn  10 Units Subcutaneous QHS   metoCLOPramide  5 mg Oral TID AC & HS   midodrine  30 mg Oral TID with meals   morphine  15 mg Oral Q12H   pantoprazole  40 mg Oral BID   polyethylene glycol  17 g Oral q morning   QUEtiapine  25 mg Oral QHS   sertraline  100 mg Oral q morning   temazepam  30 mg Oral QHS   Continuous Infusions:  DAPTOmycin (CUBICIN)  IV 600 mg (04/08/21 2045)   piperacillin-tazobactam (ZOSYN)  IV 3.375 g (04/09/21 0357)   PRN Meds:.acetaminophen **OR** acetaminophen, albuterol, HYDROmorphone (DILAUDID) injection, ondansetron **OR** ondansetron (ZOFRAN) IV, oxyCODONE Allergies  Allergen Reactions   Duloxetine Hcl Other (See Comments)     tremors   Cortisone Other (See Comments)    Unknown reaction   Hydrocortisone Other (See Comments)    Unknown reaction   Latex Other (See Comments)    Unknown reaction    OBJECTIVE: Vitals:   04/08/21 2033 04/08/21 2345 04/09/21 0411 04/09/21 0806  BP: 132/74 (!) 94/59 107/63 (!) 148/65  Pulse: 68 60 62 65  Resp:  18 18 18   Temp: 97.9 F (36.6 C) 97.9 F (36.6 C) 97.7 F (36.5 C) 97.7 F (36.5 C)  TempSrc: Oral Oral Oral Oral  SpO2: 97% 98% 97% 100%  Weight:      Height:  Body mass index is 32.61 kg/m.  Physical Exam Constitutional:      Appearance: Normal appearance.  HENT:     Head: Normocephalic and atraumatic.     Right Ear: Tympanic membrane normal.     Left Ear: Tympanic membrane normal.     Nose: Nose normal.     Mouth/Throat:     Mouth: Mucous membranes are moist.  Eyes:     Extraocular Movements: Extraocular movements intact.     Conjunctiva/sclera: Conjunctivae normal.     Pupils: Pupils are equal, round, and reactive to light.  Cardiovascular:     Rate and Rhythm: Normal rate and regular rhythm.     Heart sounds: No murmur heard.   No friction rub. No  gallop.  Pulmonary:     Effort: Pulmonary effort is normal.     Breath sounds: Normal breath sounds.  Abdominal:     General: Abdomen is flat.     Palpations: Abdomen is soft.  Musculoskeletal:        General: Normal range of motion.  Skin:    General: Skin is warm and dry.  Neurological:     General: No focal deficit present.     Mental Status: She is alert and oriented to person, place, and time.  Psychiatric:        Mood and Affect: Mood normal.      Lab Results Lab Results  Component Value Date   WBC 5.7 04/07/2021   HGB 10.9 (L) 04/07/2021   HCT 36.1 04/07/2021   MCV 90.3 04/07/2021   PLT 147 (L) 04/07/2021    Lab Results  Component Value Date   CREATININE 1.14 (H) 04/09/2021   BUN 14 04/09/2021   NA 139 04/09/2021   K 4.9 04/09/2021   CL 102 04/09/2021   CO2 28 04/09/2021    Lab Results  Component Value Date   ALT 11 04/04/2021   AST 21 04/04/2021   ALKPHOS 185 (H) 04/04/2021   BILITOT 0.7 04/04/2021        Laurice Record, [place] [facility] Infectious Disease [facility] 04/09/2021, 11:06 AM

## 2021-04-09 NOTE — Progress Notes (Signed)
Pharmacy Antibiotic Note  Tonya Baker is a 71 y.o. female admitted on 04/04/2021 with sepsis and cellulitis.  Pharmacy has been consulted for zosyn and daptomycin dosing.  Of note, patient's serum creatinine on admission was 1.46. It has since improved and stabilized at 1.07.   Scr used: 1.07 mg/dL Weight: 38.1 kg ABW; 82.9 kg IBW Vd coeff: 0.5 L/kg Est AUC: 435.4  Plan: Daptomycin 600 mg IV qday Zosyn 3.375 g q8h Follow-up WBC, renal function, clinical course, cultures  Deescalate when able. Levels at steady state as indicated  Height: 5\' 4"  (162.6 cm) Weight: 86.2 kg (190 lb) IBW/kg (Calculated) : 54.7  Temp (24hrs), Avg:97.8 F (36.6 C), Min:97.7 F (36.5 C), Max:97.9 F (36.6 C)  Recent Labs  Lab 04/04/21 1040 04/04/21 1814 04/05/21 0333 04/06/21 0931 04/07/21 0615 04/08/21 0319 04/09/21 0508  WBC 11.2*  --  4.3 5.0 5.7  --   --   CREATININE 1.46*  --  1.07* 1.07* 1.12* 1.17* 1.14*  LATICACIDVEN 2.0* 1.8  --   --   --   --   --      Estimated Creatinine Clearance: 49.5 mL/min (A) (by C-G formula based on SCr of 1.14 mg/dL (H)).    Allergies  Allergen Reactions   Duloxetine Hcl Other (See Comments)     tremors   Cortisone Other (See Comments)    Unknown reaction   Hydrocortisone Other (See Comments)    Unknown reaction   Latex Other (See Comments)    Unknown reaction    Antimicrobials this admission: Vancomycin 12/30 >> 1/3  Zosyn 12/30 >> Dapto 1/3 >>   Microbiology results: 12/30 BCx: ngtd  Thank you for allowing pharmacy to be a part of this patients care.  1/31 BS, PharmD, BCPS Clinical Pharmacist 04/09/2021  12:31 PM  Please check [email or web address] for unit-specific pharmacy phone numbers.

## 2021-04-09 NOTE — Evaluation (Signed)
Physical Therapy Evaluation Patient Details Name: Tonya Baker MRN: [id number] DOB: [birth year] Today's Date: 04/09/2021  History of Present Illness  71 y.o. woman s/p revision of lumbosacral pseudoarthrosis with post-op readmission for sepsis picture. CT with hardware in good position and post-op fluid collection. MRI L-spine w/ no evidence of discitis / osteomyelitis / epidural abscess most c/w post-op seroma. 1/2 s/p IR drainage of collection, no purulence but +gram stain for GNRs. PMH: IIDM, diabetic neuropathy, orthostatic hypotension on midodrine, HTN, HLD, obesity  Clinical Impression  PTA pt at [facility], rehabbing from back surgery. Pt reports that she does not recall the circumstances around her current admission, is aware of current situation however asks what day it is. Pt reports at SNF she was ambulating 50 feet with RW and was able to perform bathing and dressing. With assessment pt is unaware of incontinence of stool. After cleaning pt requires mod A for bed mobility, transfers and ambulation of 2 feet with RW. Pt is limited in safe mobility by back and LE pain R>L, impaired sensation, decreased AROM, decreased strength and decreased endurance.  PT recommends return to SNF to continue rehabilitation. PT will continue to follow acutely.        Recommendations for follow up therapy are one component of a multi-disciplinary discharge planning process, led by the attending physician.  Recommendations may be updated based on patient status, additional functional criteria and insurance authorization.  Follow Up Recommendations Skilled nursing-short term rehab (<3 hours/day)    Assistance Recommended at Discharge Frequent or constant Supervision/Assistance     Equipment Recommendations None recommended by PT  Recommendations for Other Services  OT consult    Functional Status Assessment Patient has had a recent decline in their functional status and demonstrates the ability to make  significant improvements in function in a reasonable and predictable amount of time.     Precautions / Restrictions Precautions Precautions: Fall;Back Precaution Comments: able to recall precautions however requires cuing to perform back precautions Other Brace: No brace Restrictions Weight Bearing Restrictions: No      Mobility  Bed Mobility Overal bed mobility: Needs Assistance Bed Mobility: Rolling;Sidelying to Sit Rolling: Mod assist Sidelying to sit: Mod assist       General bed mobility comments: needs cuing for log rolling to maintain sidelying prior to dropping LE off bed, requires rolling R and L for pericare, pt requires minA for managing LE of bed, requires modA for bringing trunk to upright    Transfers Overall transfer level: Needs assistance Equipment used: Rolling walker (2 wheels) Transfers: Sit to/from Stand Sit to Stand: Mod assist;Min assist           General transfer comment: modA for initial sit to stand, some dizziness with standing which dissipates quickly, requires seated restbreak during ambulation and is min A for power up and self steadying from elevated positions    Ambulation/Gait Ambulation/Gait assistance: Mod assist Gait Distance (Feet): 2 Feet Assistive device: Rolling walker (2 wheels) Gait Pattern/deviations: Decreased dorsiflexion - left;Decreased weight shift to left;Shuffle;Decreased step length - left;Decreased stance time - left;Trunk flexed Gait velocity: decreased Gait velocity interpretation: <1.31 ft/sec, indicative of household ambulator Pre-gait activities: marching in place General Gait Details: modA for stepping 2 feet forwards, request to return to seated due to fatigue, able to step to recliner after rest with mod A for steadying         Balance Overall balance assessment: Needs assistance Sitting-balance support: Feet supported;No upper extremity supported;Bilateral upper extremity supported;Single  extremity  supported Sitting balance-Leahy Scale: Fair     Standing balance support: Bilateral upper extremity supported;Reliant on assistive device for balance;During functional activity Standing balance-Leahy Scale: Poor Standing balance comment: min A for balance                             Pertinent Vitals/Pain Pain Assessment: 0-10 Pain Score: 7  Pain Location: back and bilateral LE R>LE Pain Descriptors / Indicators: Grimacing;Guarding;Discomfort;Radiating;Throbbing Pain Intervention(s): Limited activity within patient's tolerance;Monitored during session;Repositioned;Premedicated before session    Home Living Family/patient expects to be discharged to:: Skilled nursing facility                   Additional Comments: [facility] per chart review    Prior Function Prior Level of Function : Needs assist       Physical Assist : Mobility (physical);ADLs (physical) Mobility (physical): Gait ADLs (physical): IADLs Mobility Comments: Pt utilizing RW for bouts of 50 ft of ambulation ADLs Comments: able to perform bathing and dressing     Hand Dominance   Dominant Hand: Right    Extremity/Trunk Assessment   Upper Extremity Assessment Upper Extremity Assessment: Generalized weakness LUE Coordination: decreased gross motor;decreased fine motor    Lower Extremity Assessment Lower Extremity Assessment: RLE deficits/detail;LLE deficits/detail RLE Deficits / Details: AROM WFL, strenght grossly 3+/5 RLE Sensation: history of peripheral neuropathy LLE Deficits / Details: PROM WFL, hip strength 2/5, knee 3+/5, ankle plantar 4/5, dorsi 3/5 LLE Sensation: history of peripheral neuropathy;decreased proprioception;decreased light touch LLE Coordination: decreased fine motor;decreased gross motor    Cervical / Trunk Assessment Cervical / Trunk Assessment: Back Surgery Cervical / Trunk Exceptions: body habitus  Communication   Communication: No difficulties   Cognition Arousal/Alertness: Awake/alert Behavior During Therapy: WFL for tasks assessed/performed Overall Cognitive Status: Impaired/Different from baseline Area of Impairment: Orientation;Memory;Problem solving                 Orientation Level: Time   Memory: Decreased short-term memory;Decreased recall of precautions Following Commands: Follows multi-step commands with increased time     Problem Solving: Slow processing;Requires verbal cues;Requires tactile cues General Comments: Pt has no memory of coming to hospital, is aware of current situation, however asks what day it is because "I lost a day or twot"        General Comments General comments (skin integrity, edema, etc.): SpO2 on RA 100%O2, VSS        Assessment/Plan    PT Assessment Patient needs continued PT services  PT Problem List Decreased strength;Decreased range of motion;Decreased activity tolerance;Decreased balance;Decreased mobility;Decreased coordination;Impaired sensation;Obesity;Pain       PT Treatment Interventions DME instruction;Gait training;Functional mobility training;Therapeutic activities;Therapeutic exercise;Balance training;Patient/family education    PT Goals (Current goals can be found in the Care Plan section)  Acute Rehab PT Goals Patient Stated Goal: return to SNF; decrease pain PT Goal Formulation: With patient Time For Goal Achievement: 04/23/21 Potential to Achieve Goals: Good    Frequency Min 2X/week        AM-PAC PT "6 Clicks" Mobility  Outcome Measure Help needed turning from your back to your side while in a flat bed without using bedrails?: A Lot Help needed moving from lying on your back to sitting on the side of a flat bed without using bedrails?: A Lot Help needed moving to and from a bed to a chair (including a wheelchair)?: A Lot Help needed standing up from a  chair using your arms (e.g., wheelchair or bedside chair)?: A Lot Help needed to walk in hospital  room?: Total Help needed climbing 3-5 steps with a railing? : Total 6 Click Score: 10    End of Session Equipment Utilized During Treatment: Gait belt Activity Tolerance: Patient limited by pain;Patient tolerated treatment well Patient left: in chair;with call bell/phone within reach;with nursing/sitter in room;with chair alarm set Nurse Communication: Mobility status PT Visit Diagnosis: Unsteadiness on feet (R26.81);Other abnormalities of gait and mobility (R26.89);Muscle weakness (generalized) (M62.81);Difficulty in walking, not elsewhere classified (R26.2);Pain;Other symptoms and signs involving the nervous system (R29.898) Pain - Right/Left:  (bilateral) Pain - part of body: Leg (anc back)    Time: 7619-5093 PT Time Calculation (min) (ACUTE ONLY): 30 min   Charges:   PT Evaluation $PT Eval Moderate Complexity: 1 Mod PT Treatments $Therapeutic Activity: 8-22 mins        Analia Zuk B. Beverely Risen PT, DPT Acute Rehabilitation Services Pager [phone] Office [phone]   Elon Alas Fleet 04/09/2021, 10:47 AM

## 2021-04-10 DIAGNOSIS — G061 Intraspinal abscess and granuloma: Secondary | ICD-10-CM | POA: Diagnosis not present

## 2021-04-10 DIAGNOSIS — G9341 Metabolic encephalopathy: Secondary | ICD-10-CM | POA: Diagnosis not present

## 2021-04-10 DIAGNOSIS — E1140 Type 2 diabetes mellitus with diabetic neuropathy, unspecified: Secondary | ICD-10-CM | POA: Diagnosis not present

## 2021-04-10 DIAGNOSIS — Z794 Long term (current) use of insulin: Secondary | ICD-10-CM | POA: Diagnosis not present

## 2021-04-10 DIAGNOSIS — A419 Sepsis, unspecified organism: Secondary | ICD-10-CM | POA: Diagnosis not present

## 2021-04-10 LAB — GLUCOSE, CAPILLARY
Glucose-Capillary: 181 mg/dL — ABNORMAL HIGH (ref 70–99)
Glucose-Capillary: 166 mg/dL — ABNORMAL HIGH (ref 70–99)
Glucose-Capillary: 121 mg/dL — ABNORMAL HIGH (ref 70–99)
Glucose-Capillary: 148 mg/dL — ABNORMAL HIGH (ref 70–99)

## 2021-04-10 LAB — BASIC METABOLIC PANEL
Anion gap: 11 (ref 5–15)
BUN: 15 mg/dL (ref 8–23)
CO2: 23 mmol/L (ref 22–32)
Calcium: 9 mg/dL (ref 8.9–10.3)
Chloride: 101 mmol/L (ref 98–111)
Creatinine, Ser: 1.02 mg/dL — ABNORMAL HIGH (ref 0.44–1.00)
GFR, Estimated: 60 mL/min — ABNORMAL LOW (ref >60)
Glucose, Bld: 208 mg/dL — ABNORMAL HIGH (ref 70–99)
Potassium: 5 mmol/L (ref 3.5–5.1)
Sodium: 135 mmol/L (ref 135–145)

## 2021-04-10 MED ORDER — SODIUM CHLORIDE 0.9 % IV SOLN
1.0000 g | INTRAVENOUS | Status: DC
  Administered 2021-04-10 – 2021-04-12 (×3): 1000 mg via INTRAVENOUS
  Filled 2021-04-10 (×3): qty 1

## 2021-04-10 NOTE — NC FL2 (Signed)
[place] LEVEL OF CARE SCREENING TOOL     IDENTIFICATION  Patient Name: Tonya Baker Birthdate: Jan 30, 1951 Sex: female Admission Date (Current Location): 04/04/2021  [facility] and [place] Number:  Herbalist and Address:  [facility] [place]. 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 [id number]   Acute metabolic encephalopathy Q000111Q   Diabetic neuropathy ([place]) 04/05/2021   Abscess of back    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   Bacteremia due to Streptococcus    Volume overload [id number]   Hardware complicating wound infection ([place])    Vertebral osteomyelitis ([place])    Back pain 12/28/2020   Hypotension 12/28/2020   CKD (chronic kidney disease) stage 3, GFR 30-59 ml/min ([place]) 12/28/2020   Spinal stenosis 12/28/2020   Obstructive sleep apnea 12/28/2020   Sepsis ([place]) 12/28/2020   Type 2 diabetes mellitus with diabetic neuropathy, with long-term current use of insulin ([place]) 12/28/2020    Orientation RESPIRATION BLADDER Height & Weight     Self, Time, Situation, Place  Normal Incontinent Weight: 190 lb (86.2 kg) Height:  5\' 4"  (162.6 cm)  BEHAVIORAL SYMPTOMS/MOOD NEUROLOGICAL BOWEL NUTRITION STATUS      Continent Diet (See d/c summary)  AMBULATORY STATUS COMMUNICATION OF NEEDS Skin   Extensive Assist Verbally Surgical wounds (2 Lower lumbar aspiration sites; back incision)                       Personal Care Assistance Level of  Assistance  Bathing, Dressing, Feeding Bathing Assistance: Maximum assistance Feeding assistance: Independent Dressing Assistance: Maximum assistance     Functional Limitations Info  Sight, Speech, Hearing Sight Info: Impaired Hearing Info: Adequate Speech Info: Adequate    SPECIAL CARE FACTORS FREQUENCY  PT (By licensed PT), OT (By licensed OT)     PT Frequency: 5x/week OT Frequency: 5x/week            Contractures Contractures Info: Not present    Additional Factors Info  Code Status, Allergies, Isolation Precautions Code Status Info: Full code Allergies Info: Duloxetine Hcl, cortisone, latex, Hydrocortisone     Isolation Precautions Info: ESBL     Current Medications (04/10/2021):  This is the current hospital active medication list Current Facility-Administered Medications  Medication Dose Route Frequency Provider Last Rate Last Admin   acetaminophen (TYLENOL) tablet 650 mg  650 mg Oral Q6H PRN Wynetta Fines T, MD   650 mg at 04/10/21 0825   Or   acetaminophen (TYLENOL) suppository 650 mg  650 mg Rectal Q6H PRN Wynetta Fines T, MD       albuterol (PROVENTIL) (2.5 MG/3ML) 0.083% nebulizer solution 2.5 mg  2.5 mg Nebulization Q8H PRN Charlynne Cousins, MD       aspirin chewable tablet 81 mg  81 mg Oral q morning Wynetta Fines T, MD   81 mg at 04/10/21 0825   atorvastatin (LIPITOR) tablet 40 mg  40 mg Oral QHS Charlynne Cousins, MD   40  mg at 04/09/21 2027   DAPTOmycin (CUBICIN) 600 mg in sodium chloride 0.9 % IVPB  600 mg Intravenous Q2000 Laurice Record, MD 124 mL/hr at 04/09/21 2033 600 mg at 04/09/21 2033   enoxaparin (LOVENOX) injection 40 mg  40 mg Subcutaneous Q24H Wynetta Fines T, MD   40 mg at 04/10/21 0825   gabapentin (NEURONTIN) capsule 600 mg  600 mg Oral TID Charlynne Cousins, MD   600 mg at 04/10/21 0825   HYDROmorphone (DILAUDID) injection 0.5-1 mg  0.5-1 mg Intravenous Q2H PRN Wynetta Fines T, MD   1 mg at 04/10/21 0421   insulin aspart (novoLOG)  injection 0-15 Units  0-15 Units Subcutaneous TID WC Charlynne Cousins, MD   3 Units at 04/10/21 [id number]   insulin aspart (novoLOG) injection 0-5 Units  0-5 Units Subcutaneous QHS Charlynne Cousins, MD       insulin aspart (novoLOG) injection 4 Units  4 Units Subcutaneous TID WC Charlynne Cousins, MD   4 Units at 04/10/21 0826   insulin glargine-yfgn (SEMGLEE) injection 10 Units  10 Units Subcutaneous QHS Wynetta Fines T, MD   10 Units at 04/09/21 2245   metoCLOPramide (REGLAN) tablet 5 mg  5 mg Oral TID AC & HS Charlynne Cousins, MD   5 mg at 04/10/21 0825   midodrine (PROAMATINE) tablet 30 mg  30 mg Oral TID with meals Charlynne Cousins, MD   30 mg at 04/10/21 0643   morphine (MS CONTIN) 12 hr tablet 15 mg  15 mg Oral Q12H Charlynne Cousins, MD   15 mg at 04/10/21 0825   ondansetron (ZOFRAN) tablet 4 mg  4 mg Oral Q6H PRN Wynetta Fines T, MD       Or   ondansetron [facility]) injection 4 mg  4 mg Intravenous Q6H PRN Wynetta Fines T, MD   4 mg at 04/05/21 1156   oxyCODONE (Oxy IR/ROXICODONE) immediate release tablet 10 mg  10 mg Oral Q8H PRN Charlynne Cousins, MD   10 mg at 04/09/21 1809   pantoprazole (PROTONIX) EC tablet 40 mg  40 mg Oral BID Charlynne Cousins, MD   40 mg at 04/10/21 0825   piperacillin-tazobactam (ZOSYN) IVPB 3.375 g  3.375 g Intravenous Q8H Heloise Purpura, RPH 12.5 mL/hr at 04/10/21 0418 3.375 g at 04/10/21 0418   polyethylene glycol (MIRALAX / GLYCOLAX) packet 17 g  17 g Oral q morning Charlynne Cousins, MD   17 g at 04/10/21 0825   QUEtiapine (SEROQUEL) tablet 25 mg  25 mg Oral QHS Wynetta Fines T, MD   25 mg at 04/09/21 2026   sertraline (ZOLOFT) tablet 100 mg  100 mg Oral q morning Wynetta Fines T, MD   100 mg at 04/10/21 0825   temazepam (RESTORIL) capsule 30 mg  30 mg Oral QHS Lequita Halt, MD   30 mg at 04/09/21 2026     Discharge Medications: Please see discharge summary for a list of discharge medications.  Relevant Imaging Results:  Relevant  Lab Results:   Additional Information SS# [id number]  [facility] Yisroel Ramming, [place]

## 2021-04-10 NOTE — Progress Notes (Signed)
[facility] PROGRESS NOTE    Progress Note  Tonya Baker  [id number] DOB: 01/07/1951 DOA: 04/04/2021 PCP: Garwin Brothers, MD     Brief Narrative:   Tonya Baker is an 71 y.o. 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 discharged on the ninth who started to have back pain the day prior to admission with a temperature of 102 in the ED was found to be hypotensive she was fluid resuscitated but remained confused CT scan of the abdomen pelvis showed Halseth 2 x 7 x 4 by 10 subcutaneous abscess in the midline back extending from T11-L3 with a CT of the C-spine that showed persistent lucency along the S1 screw tract, there was a thick rim enhancing collection along the posterior soup acute soft tissue consistent with recent surgery measuring 3 x 2 cm.MRI lumbar spine showed 2 contiguous loculated collection at the laminectomy defect along the lower midline incision. IR has been consulted and the plan is to proceed with CT-guided aspiration of the fluid collection in the spine on 04/07/2021..   Significant studies: MRI of the thoracic spine showed postsurgical changes with visualization of fluid collection along the posterior surgical incision MRI of the lumbar spine showed 2 contiguous loculated collection at the laminectomy to me defect along with lower midline incision   Antibiotics: 04/04/2021 IV vancomycin 04/04/2021 IV Zosyn  Microbiology data: 04/05/2019 blood culture: Negative till date  Procedures: CT guided aspiration of lumber fluids  Assessment/Plan:   Severe sepsis due to abscess of her lumbar spine: She was started empirically on IV vancomycin and cefepime and fluid resuscitated. Blood cultures negative till date Status post CT-guided aspiration on 04/07/2021 gram stain with G-rods,   cultures no growth so far Seen by neurosurgery per note from 1/3 "Given that it has been drained and there was no purulence, along with the MRI findings lacking evidence of an abscess, I do not think there is an indication to reopen her wound" ID consulted, currently on daptomycin and zosyn, will follow ID recommendation  Acute metabolic encephalopathy: Secondary to sepsis, CT scan of the head showed no acute findings. Appear oriented x3 but having trouble  finding the right words States this is her baseline, it happens sometime when she just wake up, she does not want to have mri brain currently Will monitor Reports at her baseline dysarthria    Insulin-dependent diabetes mellitus type 2: Blood glucose is well controlled. Continue long-acting insulin plus sliding scale.  Diabetic neuropathy, anxiety/depression: Neurontin has been resumed.  Autonomic dysfunction Resume midodrine.  Diabetic neuropathy: Resume gabapentin.  Hypokalemia: Replaced   Acute thrombocytopenia: Recheck a CBC today question due to infectious etiology. Improving, monitor   DVT prophylaxis: lovenox Family Communication:none Status is: Inpatient Disposition: return to [facility] inpatient appropriate because: Severe sepsis   Code Status:     Code Status Orders  (From admission, onward)           Start     Ordered   04/04/21 1427  Full code  Continuous        04/04/21 1428           Code Status History     Date Active Date Inactive Code Status Order ID Comments User Context   03/10/2021 1756 03/14/2021 2212 Full Code [id number]  Judith Part, MD Inpatient   12/28/2020 2230  01/04/2021 0406 Full Code [id number]  Renee Pain, MD ED         IV Access:   Peripheral IV   Procedures and diagnostic studies:   No results found.   Medical Consultants:   None.   Subjective:    Tonya Baker has no acute complaints, report feeling back to her  baseline She continue to have mild dysarthria/word finding trouble, report this is her baseline  Objective:    Vitals:   04/09/21 1630 04/09/21 2015 04/10/21 0015 04/10/21 0414  BP: 122/72 137/64 122/62 (!) 109/56  Pulse: 68 63 64 70  Resp: 17 18 16 17   Temp: (!) 97.5 F (36.4 C) 97.7 F (36.5 C) 97.6 F (36.4 C) 97.6 F (36.4 C)  TempSrc: Oral Oral Oral Oral  SpO2: 98% 100% 100% 100%  Weight:      Height:       SpO2: 100 % O2 Flow Rate (L/min): 2 L/min   Intake/Output Summary (Last 24 hours) at 04/10/2021 0757 Last data filed at 04/10/2021 0415 Gross per 24 hour  Intake 1027 ml  Output 2500 ml  Net -1473 ml   Filed Weights   04/04/21 1029  Weight: 86.2 kg    Exam: General exam: In no acute distress. Respiratory system: Good air movement and clear to auscultation. Cardiovascular system: S1 & S2 heard, RRR. No JVD. Gastrointestinal system: Abdomen is nondistended, soft and nontender.  Extremities: No pedal edema.charcot foot deformity left foot Skin: No rashes, lesions or ulcers Psychiatry: Judgement and insight appear normal. Mood & affect appropriate. Data Reviewed:    Labs: Basic Metabolic Panel: Recent Labs  Lab 04/06/21 0931 04/07/21 0615 04/08/21 0319 04/09/21 0508 04/10/21 0207  NA 140 138 137 139 135  K 4.4 3.9 4.3 4.9 5.0  CL 108 103 103 102 101  CO2 26 29 23 28 23   GLUCOSE 84 112* 119* 168* 208*  BUN 13 12 13 14 15   CREATININE 1.07* 1.12* 1.17* 1.14* 1.02*  CALCIUM 8.7* 8.9 8.8* 9.0 9.0  PHOS 4.0  --   --   --   --    GFR Estimated Creatinine Clearance: 55.3 mL/min (A) (by C-G formula based on SCr of 1.02 mg/dL (H)). Liver Function Tests: Recent Labs  Lab 04/04/21 1040 04/06/21 0931  AST 21  --   ALT 11  --   ALKPHOS 185*  --   BILITOT 0.7  --   PROT 6.9  --   ALBUMIN 2.8* 2.5*   No results for input(s): LIPASE, AMYLASE in the last 168 hours. Recent Labs  Lab 04/04/21 1305  AMMONIA <10   Coagulation profile Recent Labs  Lab  04/04/21 1040 04/07/21 0615  INR 1.1 1.1   COVID-19 Labs  No results for input(s): DDIMER, FERRITIN, LDH, CRP in the last 72 hours.  Lab Results  Component Value Date   SARSCOV2NAA NEGATIVE 04/04/2021   [place] NEGATIVE 03/13/2021   [place] NEGATIVE 03/10/2021   [place] NEGATIVE 01/02/2021    CBC: Recent Labs  Lab 04/04/21 1040 04/04/21 1331 04/05/21 0333 04/06/21 0931 04/07/21 0615  WBC 11.2*  --  4.3 5.0 5.7  NEUTROABS 9.6*  --   --  2.7  --   HGB 11.1* 10.9* 9.2* 11.2* 10.9*  HCT 35.4* 32.0* 29.8* 37.9 36.1  MCV 88.5  --  90.0 91.8 90.3  PLT 161  --  110* 145* 147*   Cardiac Enzymes: Recent Labs  Lab 04/09/21 0508  CKTOTAL 14*   BNP (last 3  results) No results for input(s): PROBNP in the last 8760 hours. CBG: Recent Labs  Lab 04/09/21 0614 04/09/21 1120 04/09/21 1629 04/09/21 2141 04/10/21 0701  GLUCAP 179* 192* 132* 134* 181*   D-Dimer: No results for input(s): DDIMER in the last 72 hours. Hgb A1c: No results for input(s): HGBA1C in the last 72 hours. Lipid Profile: No results for input(s): CHOL, HDL, LDLCALC, TRIG, CHOLHDL, LDLDIRECT in the last 72 hours. Thyroid function studies: No results for input(s): TSH, T4TOTAL, T3FREE, THYROIDAB in the last 72 hours.  Invalid input(s): FREET3  Anemia work up: No results for input(s): VITAMINB12, FOLATE, FERRITIN, TIBC, IRON, RETICCTPCT in the last 72 hours. Sepsis Labs: Recent Labs  Lab 04/04/21 1040 04/04/21 1814 04/05/21 0333 04/06/21 0931 04/07/21 0615  WBC 11.2*  --  4.3 5.0 5.7  LATICACIDVEN 2.0* 1.8  --   --   --    Microbiology Recent Results (from the past 240 hour(s))  Blood Culture (routine x 2)     Status: None   Collection Time: 04/04/21 10:40 AM   Specimen: BLOOD LEFT HAND  Result Value Ref Range Status   Specimen Description BLOOD LEFT HAND  Final   Special Requests   Final    BOTTLES DRAWN AEROBIC AND ANAEROBIC Blood Culture adequate volume   Culture   Final     NO GROWTH 5 DAYS Performed at [facility], [address] [place], [place] [address]    Report Status 04/09/2021 FINAL  Final  Resp Panel by RT-PCR (Flu A&B, Covid) Nasopharyngeal Swab     Status: None   Collection Time: 04/04/21 11:00 AM   Specimen: Nasopharyngeal Swab; Nasopharyngeal(NP) swabs in vial transport medium  Result Value Ref Range Status   SARS Coronavirus 2 by RT PCR NEGATIVE NEGATIVE Final    Comment: (NOTE) SARS-CoV-2 target nucleic acids are NOT DETECTED.  The SARS-CoV-2 RNA is generally detectable in upper respiratory specimens during the acute phase of infection. The lowest concentration of SARS-CoV-2 viral copies this assay can detect is 138 copies/mL. A negative result does not preclude SARS-Cov-2 infection and should not be used as the sole basis for treatment or other patient management decisions. A negative result may occur with  improper specimen collection/handling, submission of specimen other than nasopharyngeal swab, presence of viral mutation(s) within the areas targeted by this assay, and inadequate number of viral copies(<138 copies/mL). A negative result must be combined with clinical observations, patient history, and epidemiological information. The expected result is Negative.  Fact Sheet for Patients:  EntrepreneurPulse.com.au  Fact Sheet for Healthcare Providers:  IncredibleEmployment.be  This test is no t yet approved or cleared by the [place] FDA and  has been authorized for detection and/or diagnosis of SARS-CoV-2 by FDA under an Emergency Use Authorization (EUA). This EUA will remain  in effect (meaning this test can be used) for the duration of the COVID-19 declaration under Section 564(b)(1) of the Act, 21 U.S.C.section 360bbb-3(b)(1), unless the authorization is terminated  or revoked sooner.       Influenza A by PCR NEGATIVE NEGATIVE Final   Influenza B by PCR NEGATIVE NEGATIVE  Final    Comment: (NOTE) The Xpert Xpress SARS-CoV-2/FLU/RSV plus assay is intended as an aid in the diagnosis of influenza from Nasopharyngeal swab specimens and should not be used as a sole basis for treatment. Nasal washings and aspirates are unacceptable for Xpert Xpress SARS-CoV-2/FLU/RSV testing.  Fact Sheet for Patients: EntrepreneurPulse.com.au  Fact Sheet for Healthcare Providers: IncredibleEmployment.be  This test is not yet approved or cleared by the [place] and has been authorized for detection and/or diagnosis of SARS-CoV-2 by FDA under an Emergency Use Authorization (EUA). This EUA will remain in effect (meaning this test can be used) for the duration of the COVID-19 declaration under Section 564(b)(1) of the Act, 21 U.S.C. section 360bbb-3(b)(1), unless the authorization is terminated or revoked.  Performed at [facility], [place] [address] [place], [place] [address]   Blood Culture (routine x 2)     Status: None   Collection Time: 04/04/21 11:10 AM   Specimen: BLOOD RIGHT FOREARM  Result Value Ref Range Status   Specimen Description BLOOD RIGHT FOREARM  Final   Special Requests   Final    BOTTLES DRAWN AEROBIC AND ANAEROBIC Blood Culture results may not be optimal due to an inadequate volume of blood received in culture bottles   Culture   Final    NO GROWTH 5 DAYS Performed at [facility], [place] [address], [place], [place] [address]    Report Status 04/09/2021 FINAL  Final  Aerobic/Anaerobic Culture w Gram Stain (surgical/deep wound)     Status: None (Preliminary result)   Collection Time: 04/07/21 12:48 PM   Specimen: Back; Wound  Result Value Ref Range Status   Specimen Description BACK LOWER LUMBAR  Final   Special Requests NONE  Final   Gram Stain   Final    MODERATE WBC PRESENT, PREDOMINANTLY MONONUCLEAR RARE GRAM NEGATIVE RODS    Culture   Final    NO GROWTH 2 DAYS NO ANAEROBES ISOLATED;  CULTURE IN PROGRESS FOR 5 DAYS Performed at [facility], [address], [place], [place] [address]    Report Status PENDING  Incomplete  Aerobic/Anaerobic Culture w Gram Stain (surgical/deep wound)     Status: None (Preliminary result)   Collection Time: 04/07/21 12:49 PM   Specimen: Back; Wound  Result Value Ref Range Status   Specimen Description BACK UPPER LUMBAR  Final   Special Requests NONE  Final   Gram Stain FEW WBC SEEN NO ORGANISMS SEEN   Final   Culture   Final    NO GROWTH 2 DAYS Performed at [facility], [address] [place], [place] [address]    Report Status PENDING  Incomplete     Medications:    aspirin  81 mg Oral q morning   atorvastatin  40 mg Oral QHS   enoxaparin (LOVENOX) injection  40 mg Subcutaneous Q24H   gabapentin  600 mg Oral TID   insulin aspart  0-15 Units Subcutaneous TID WC   insulin aspart  0-5 Units Subcutaneous QHS   insulin aspart  4 Units Subcutaneous TID WC   insulin glargine-yfgn  10 Units Subcutaneous QHS   metoCLOPramide  5 mg Oral TID AC & HS   midodrine  30 mg Oral TID with meals   morphine  15 mg Oral Q12H   pantoprazole  40 mg Oral BID   polyethylene glycol  17 g Oral q morning   QUEtiapine  25 mg Oral QHS   sertraline  100 mg Oral q morning   temazepam  30 mg Oral QHS   Continuous Infusions:  DAPTOmycin (CUBICIN)  IV 600 mg (04/09/21 2033)   piperacillin-tazobactam (ZOSYN)  IV 3.375 g (04/10/21 0418)      LOS: 6 days   Florencia Reasons MD PhD FACP [facility]  04/10/2021, 7:57 AM

## 2021-04-10 NOTE — Progress Notes (Signed)
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 Neurosurgery consulted who recommended IR aspiration and MRI.  -MRI showed two contiguous loculated collection at the laminectomy defect measuring 3.4x 2.1x 6.9cm and along midline incision measuring 2.6cm x3.6cm x 8.8 cm.  -CT guided aspiration of lumbar fluid collection with Cx  showed gram stain +GNR  but negative Cx x 3 daya.   Recommendations:  -D/C pip-tazo -Start ertapenem -Continue daptomycin -Anticipate 8 weeks of antibiotics from OR for vertebral infection (EOT 05/31/21) followed by chronic suppression with doxycyline. Aspirate Cx are negative(following 3 days of IV antibiotics) as such would not broaden coverage for suppressive antibiotics. -Follow-up appointment with ID clinic scheduled   OPAT ORDERS:  Diagnosis: Vertebral infection with retained hardware  Culture Result: GS: GNR, nEgative Cx  Allergies  Allergen Reactions   Duloxetine Hcl Other (See Comments)      tremors   Cortisone Other (See Comments)    Unknown reaction   Hydrocortisone Other (See Comments)    Unknown reaction   Latex Other (See Comments)    Unknown reaction     Discharge antibiotics to be given via PICC line:  Per pharmacy protocol daptomycin and ertapenem    Duration: 8 weeks from OR End Date: 05/31/21  [facility] Care Per Protocol with [employer] Use: Home health RN for IV administration and teaching, line care and labs.    Labs weekly while on IV antibiotics: __ CBC with differential __ CMP __ CRP __ ESR __ CK  __ Please pull PIC at completion of IV antibiotics   Fax weekly labs to [phone]  Clinic Follow Up Appt: 04/21/21 at 9:30 AM  @ [facility] with Dr. Candiss Norse  Microbiology:   Antibiotics: Pip-tazo 12/30-p Vancomycin 12/30-04/08/21 Daptomycin 04/08/21-p   Cultures: Blood 12/30 NGTD  12/23 OR Cx GS: GNR, NGTD x 3 SUBJECTIVE: Pt is resting in bed. Reports back pain has improved.  Interval: Afebrile overnight. No significant overnight events.   Review of Systems: Review of Systems  All other systems reviewed and are negative.   Scheduled Meds:  aspirin  81 mg Oral q morning   atorvastatin  40 mg Oral QHS   enoxaparin (LOVENOX) injection  40 mg Subcutaneous Q24H   gabapentin  600 mg Oral TID   insulin aspart  0-15 Units Subcutaneous TID WC   insulin aspart  0-5 Units Subcutaneous QHS   insulin aspart  4 Units Subcutaneous TID WC   insulin glargine-yfgn  10 Units Subcutaneous QHS   metoCLOPramide  5 mg Oral TID AC & HS   midodrine  30 mg Oral TID with meals   morphine  15 mg Oral Q12H   pantoprazole  40 mg Oral BID   polyethylene glycol  17 g Oral q morning   QUEtiapine  25 mg Oral QHS   sertraline  100 mg Oral q morning   temazepam  30 mg Oral QHS   Continuous Infusions:  DAPTOmycin (CUBICIN)  IV 600 mg (04/10/21 2111)   ertapenem 1,000 mg (04/10/21 2215)   PRN Meds:.acetaminophen **OR** acetaminophen, albuterol, HYDROmorphone  (DILAUDID) injection, ondansetron **OR** ondansetron (ZOFRAN) IV, oxyCODONE Allergies  Allergen Reactions   Duloxetine Hcl Other (See Comments)     tremors   Cortisone Other (See Comments)    Unknown reaction   Hydrocortisone Other (See Comments)    Unknown reaction   Latex Other (See Comments)    Unknown reaction    OBJECTIVE: Vitals:   04/10/21 0832 04/10/21 1227 04/10/21 1746 04/10/21 2105  BP: (!) 149/73 106/63 (!) 157/66 (!) 161/74  Pulse: 66 86 62 60  Resp: $Remo'17 18 17 18  'SfMKl$ Temp: 98 F (36.7 C) 97.6 F (36.4 C) 98 F (36.7 C) 97.8 F (36.6 C)  TempSrc: Oral Oral Oral Oral  SpO2: 100% 100% 100% 100%  Weight:      Height:       Body mass index is 32.61 kg/m.  Physical Exam Constitutional:      Appearance: Normal appearance.  HENT:     Head: Normocephalic and atraumatic.     Right Ear: Tympanic membrane normal.     Left Ear: Tympanic membrane normal.     Nose: Nose normal.     Mouth/Throat:     Mouth: Mucous membranes are moist.  Eyes:     Extraocular Movements: Extraocular movements intact.     Conjunctiva/sclera: Conjunctivae normal.     Pupils: Pupils are equal, round, and reactive to light.  Cardiovascular:     Rate and Rhythm: Normal rate and regular rhythm.     Heart sounds: No murmur heard.   No friction rub. No gallop.  Pulmonary:     Effort: Pulmonary effort is normal.     Breath sounds: Normal breath sounds.  Abdominal:     General: Abdomen is flat.     Palpations: Abdomen is soft.  Musculoskeletal:        General: Normal range of motion.  Skin:    General: Skin is warm and dry.  Neurological:     General: No focal deficit present.     Mental Status: She is alert and oriented to person, place, and time.  Psychiatric:        Mood and Affect: Mood normal.      Lab Results Lab Results  Component Value Date   WBC 5.7 04/07/2021   HGB 10.9 (L) 04/07/2021   HCT 36.1 04/07/2021   MCV 90.3 04/07/2021   PLT 147 (L) 04/07/2021    Lab  Results  Component Value Date   CREATININE 1.02 (H) 04/10/2021   BUN 15 04/10/2021   NA 135 04/10/2021   K 5.0 04/10/2021   CL 101 04/10/2021   CO2 23 04/10/2021    Lab Results  Component Value Date   ALT 11 04/04/2021   AST 21 04/04/2021   ALKPHOS 185 (H) 04/04/2021   BILITOT 0.7 04/04/2021        Laurice Record, [facility] 04/10/2021, 11:26 PM

## 2021-04-11 ENCOUNTER — Inpatient Hospital Stay: Payer: Self-pay

## 2021-04-11 DIAGNOSIS — Z794 Long term (current) use of insulin: Secondary | ICD-10-CM | POA: Diagnosis not present

## 2021-04-11 DIAGNOSIS — E1140 Type 2 diabetes mellitus with diabetic neuropathy, unspecified: Secondary | ICD-10-CM | POA: Diagnosis not present

## 2021-04-11 DIAGNOSIS — G9341 Metabolic encephalopathy: Secondary | ICD-10-CM | POA: Diagnosis not present

## 2021-04-11 DIAGNOSIS — G061 Intraspinal abscess and granuloma: Secondary | ICD-10-CM | POA: Diagnosis not present

## 2021-04-11 LAB — GLUCOSE, CAPILLARY
Glucose-Capillary: 202 mg/dL — ABNORMAL HIGH (ref 70–99)
Glucose-Capillary: 141 mg/dL — ABNORMAL HIGH (ref 70–99)
Glucose-Capillary: 154 mg/dL — ABNORMAL HIGH (ref 70–99)
Glucose-Capillary: 179 mg/dL — ABNORMAL HIGH (ref 70–99)

## 2021-04-11 LAB — RESP PANEL BY RT-PCR (FLU A&B, COVID) ARPGX2
Influenza A by PCR: NEGATIVE
Influenza B by PCR: NEGATIVE
SARS Coronavirus 2 by RT PCR: NEGATIVE

## 2021-04-11 MED ORDER — SODIUM CHLORIDE 0.9% FLUSH: 10.0000 mL | INTRAVENOUS | Status: DC | PRN

## 2021-04-11 MED ORDER — SODIUM CHLORIDE 0.9% FLUSH
10.0000 mL | Freq: Two times a day (BID) | INTRAVENOUS | Status: DC
  Administered 2021-04-11 – 2021-04-12 (×3): 10 mL

## 2021-04-11 MED ORDER — CHLORHEXIDINE GLUCONATE CLOTH 2 % EX PADS
6.0000 | MEDICATED_PAD | Freq: Every day | CUTANEOUS | Status: DC
  Administered 2021-04-12: 6 via TOPICAL

## 2021-04-11 NOTE — Progress Notes (Signed)
Occupational Therapy Treatment Patient Details Name: Tonya Baker MRN: [id number] DOB: Nov 08, 1950 Today's Date: 04/11/2021   History of present illness 71 y.o. woman s/p revision of lumbosacral pseudoarthrosis with post-op readmission for sepsis picture. CT with hardware in good position and post-op fluid collection. MRI L-spine w/ no evidence of discitis / osteomyelitis / epidural abscess most c/w post-op seroma. 1/2 s/p IR drainage of collection, no purulence but +gram stain for GNRs. PMH: IIDM, diabetic neuropathy, orthostatic hypotension on midodrine, HTN, HLD, obesity   OT comments  Pt. Seen for skilled OT treatment session.  Good recall of back precautions.  Good demo of log roll for oob with S.  Min a for LB dressing.  Min guard for sit/stand and short distance ambulation to recliner. Current d/c recommendations remain appropriate.    Recommendations for follow up therapy are one component of a multi-disciplinary discharge planning process, led by the attending physician.  Recommendations may be updated based on patient status, additional functional criteria and insurance authorization.    Follow Up Recommendations  Skilled nursing-short term rehab (<3 hours/day)    Assistance Recommended at Discharge Frequent or constant Supervision/Assistance  Patient can return home with the following      Equipment Recommendations  Other (comment)    Recommendations for Other Services      Precautions / Restrictions Precautions Precautions: Fall;Back Precaution Comments: able to recall 3/3 precautions without cueing Other Brace: No brace       Mobility Bed Mobility Overal bed mobility: Needs Assistance Bed Mobility: Rolling;Sidelying to Sit Rolling: Supervision Sidelying to sit: Supervision       General bed mobility comments: good recall of log roll tech. able to completed safely with step by step instructions, no physical assistance required    Transfers Overall transfer level:  Needs assistance Equipment used: Rolling walker (2 wheels) Transfers: Sit to/from Stand;Bed to chair/wheelchair/BSC Sit to Stand: Supervision Stand pivot transfers: Min guard         General transfer comment: good tech. observed stateed "wheres the hand rests" and reached for them before sitting down     Balance                                           ADL either performed or assessed with clinical judgement   ADL Overall ADL's : Needs assistance/impaired                     Lower Body Dressing: Minimal assistance;Sitting/lateral leans Lower Body Dressing Details (indicate cue type and reason): some initial assistance to place slip on sneaker over toes then able to cross leg over knee and pull the shoe on the rest of the way-reacher would be beneficial Toilet Transfer: Min guard;Rolling walker (2 wheels) Toilet Transfer Details (indicate cue type and reason): observed during transfer from eob to recliner approx. 4 pivotal steps-declined ambulation to b.room for actual use         Functional mobility during ADLs: Min guard;Minimal assistance      Extremity/Trunk Assessment              Vision       Perception     Praxis      Cognition Arousal/Alertness: Awake/alert Behavior During Therapy: WFL for tasks assessed/performed Overall Cognitive Status: Impaired/Different from baseline Area of Impairment: Memory;Problem solving  Memory: Decreased short-term memory;Decreased recall of precautions Following Commands: Follows multi-step commands with increased time     Problem Solving: Slow processing;Requires verbal cues General Comments: some difficulty with word finding and described confusion mid story laughing and would say "i dont even know where i am hold on" then continue trying to tell the story          Exercises     Shoulder Instructions       General Comments  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  was a Brewing technologist    Pertinent Vitals/ Pain       Pain Assessment: 0-10 Pain Score: 9  Pain Location: back Pain Descriptors / Indicators: Aching Pain Intervention(s): Premedicated before session  Home Living                                          Prior Functioning/Environment              Frequency  Min 2X/week        Progress Toward Goals  OT Goals(current goals can now be found in the care plan section)  Progress towards OT goals: Progressing toward goals     Plan Discharge plan remains appropriate    Co-evaluation                 AM-PAC OT "6 Clicks" Daily Activity     Outcome Measure   Help from another person eating meals?: A Little Help from another person taking care of personal grooming?: A Little Help from another person toileting, which includes using toliet, bedpan, or urinal?: Total Help from another person bathing (including washing, rinsing, drying)?: A Little Help from another person to put on and taking off regular upper body clothing?: A Little Help from another person to put on and taking off regular lower body clothing?: A Little 6 Click Score: 16    End of Session Equipment Utilized During Treatment: Rolling walker (2 wheels)  OT Visit Diagnosis: Unsteadiness on feet (R26.81);Other abnormalities of gait and mobility (R26.89);Muscle weakness (generalized) (M62.81)   Activity Tolerance Patient tolerated treatment well   Patient Left in chair;with call bell/phone within reach;Other (comment) (MD in room meeting with pt.)   Nurse Communication          Time: [phone] OT Time Calculation (min): 12 min  Charges: OT General Charges $OT Visit: 1 Visit OT Treatments $Self Care/Home Management : 8-22 mins  Boneta Lucks, COTA/L Acute Rehabilitation [phone]   Salvadore Oxford 04/11/2021, 11:51 AM

## 2021-04-11 NOTE — Care Management Important Message (Signed)
Important Message  Patient Details  Name: Tonya Baker MRN: [id number] Date of Birth: 1950/11/25   Medicare Important Message Given:  Yes     Renie Ora 04/11/2021, 11:11 AM

## 2021-04-11 NOTE — Progress Notes (Addendum)
PHARMACY CONSULT NOTE FOR:  OUTPATIENT  PARENTERAL ANTIBIOTIC THERAPY (OPAT)  Indication: Vertebral infection with retained hardware Regimen: Daptomycin 600mg  IV q24h, Ertapenem 1g IV q24h End date: 05/31/2021  IV antibiotic discharge orders are pended. To discharging provider:  please sign these orders via discharge navigator,  Select [facility] Orders & click on the button choice - Manage This Unsigned Work.     Thank you for allowing pharmacy to be a part of this patient's care.   Please check AMION for all [facility] Pharmacy phone numbers After 10:00 PM, call Main Pharmacy [phone]  [phone], PharmD PGY1 Pharmacy Resident 04/11/2021 10:59 AM

## 2021-04-11 NOTE — Progress Notes (Signed)
Mobility Specialist Progress Note    04/11/21 1222  Mobility  Activity Transferred:  Chair to bed  Level of Assistance Minimal assist, patient does 75% or more  Assistive Device Front wheel walker  Distance Ambulated (ft) 2 ft  Mobility Out of bed to chair with meals  Mobility Response Tolerated fair  Mobility performed by Mobility specialist  $Mobility charge 1 Mobility   Pt c/o feeling shaky upon entry. Left with call bell in reach.   [facility] Mobility Specialist  M.S. Primary Phone: [phone] M.S. Secondary Phone: [phone]

## 2021-04-11 NOTE — Progress Notes (Signed)
[facility] PROGRESS NOTE    Progress Note  Alijah Huso  [id number] DOB: [birth year] DOA: 04/04/2021 PCP: Garwin Brothers, MD     Brief Narrative:   Atisha Windon is an 71 y.o. 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 discharged on the ninth who started to have back pain the day prior to admission with a temperature of 102 in the ED was found to be hypotensive she was fluid resuscitated but remained confused CT scan of the abdomen pelvis showed Huq 2 x 7 x 4 by 10 subcutaneous abscess in the midline back extending from T11-L3 with a CT of the C-spine that showed persistent lucency along the S1 screw tract, there was a thick rim enhancing collection along the posterior soup acute soft tissue consistent with recent surgery measuring 3 x 2 cm.MRI lumbar spine showed 2 contiguous loculated collection at the laminectomy defect along the lower midline incision. IR has been consulted and the plan is to proceed with CT-guided aspiration of the fluid collection in the spine on 04/07/2021..   Significant studies: MRI of the thoracic spine showed postsurgical changes with visualization of fluid collection along the posterior surgical incision MRI of the lumbar spine showed 2 contiguous loculated collection at the laminectomy to me defect along with lower midline incision   Antibiotics: 04/04/2021 IV vancomycin 04/04/2021 IV Zosyn  Microbiology data: 04/05/2019 blood culture: Negative till date  Procedures: CT guided aspiration of lumber fluids  Assessment/Plan:   Severe sepsis due to abscess of her lumbar spine: She was started empirically on IV vancomycin and cefepime and fluid resuscitated. Blood cultures negative till date Status post CT-guided aspiration on 04/07/2021 gram stain with G-rods,   cultures no growth so far Seen by neurosurgery per note from 1/3 "Given that it has been drained and there was no purulence, along with the MRI findings lacking evidence of an abscess, I do not think there is an indication to reopen her wound" ID consulted, abx adjusted, plan for picc placement and 8wks of iv abx Picc line placement,  appreciate ID recommendation  Acute metabolic encephalopathy: Secondary to sepsis, CT scan of the head showed no acute findings. Appear oriented x3 but having trouble  finding the right words States this is her baseline, it happens sometime when she just wake up, she does not want to have mri brain currently Will monitor Reports at her baseline dysarthria    Insulin-dependent diabetes mellitus type 2: Blood glucose is well controlled. Continue long-acting insulin plus sliding scale.  Diabetic neuropathy, anxiety/depression: Neurontin has been resumed.  Autonomic dysfunction Resume midodrine.  Diabetic neuropathy: Resume gabapentin.  Hypokalemia: Replaced   Acute thrombocytopenia: Recheck a CBC today question due to infectious etiology. Improving, monitor   DVT prophylaxis: lovenox Family Communication:niece over the phone Status is: Inpatient Disposition: return to [facility] after picc line placement    Code Status:   Code Status: DNR Confirmed with patient and family        IV Access:   Peripheral IV   Procedures and diagnostic studies:   [place] EKG SITE RITE  Result Date: 04/11/2021 If Site Rite image not attached, placement could not be confirmed due to current cardiac rhythm.    Medical Consultants:   None.   Subjective:    Rosyln Rapoza has no acute complaints, report feeling back to her baseline She  continue to have mild dysarthria/word finding trouble, report this is her baseline She is medically stable, awaiting for picc line placement then return to snf  Objective:    Vitals:   04/11/21 0041  04/11/21 0431 04/11/21 0854 04/11/21 1241  BP: 136/72 (!) 120/58 127/66 (!) 158/81  Pulse: 62 66 65 60  Resp: 18 18 20 17   Temp: 98.4 F (36.9 C) 97.7 F (36.5 C) 97.7 F (36.5 C) (!) 97.5 F (36.4 C)  TempSrc: Oral Oral Oral Oral  SpO2: 100% 97% 97% 96%  Weight:      Height:       SpO2: 96 % O2 Flow Rate (L/min): 2 L/min   Intake/Output Summary (Last 24 hours) at 04/11/2021 1651 Last data filed at 04/11/2021 1502 Gross per 24 hour  Intake 480 ml  Output 2001 ml  Net -1521 ml   Filed Weights   04/04/21 1029  Weight: 86.2 kg    Exam: General exam: In no acute distress. Respiratory system: Good air movement and clear to auscultation. Cardiovascular system: S1 & S2 heard, RRR. No JVD. Gastrointestinal system: Abdomen is nondistended, soft and nontender.  Extremities: No pedal edema.charcot foot deformity left foot Skin: No rashes, lesions or ulcers Psychiatry: Judgement and insight appear normal. Mood & affect appropriate. Data Reviewed:    Labs: Basic Metabolic Panel: Recent Labs  Lab 04/06/21 0931 04/07/21 0615 04/08/21 0319 04/09/21 0508 04/10/21 0207  NA 140 138 137 139 135  K 4.4 3.9 4.3 4.9 5.0  CL 108 103 103 102 101  CO2 26 29 23 28 23   GLUCOSE 84 112* 119* 168* 208*  BUN 13 12 13 14 15   CREATININE 1.07* 1.12* 1.17* 1.14* 1.02*  CALCIUM 8.7* 8.9 8.8* 9.0 9.0  PHOS 4.0  --   --   --   --    GFR Estimated Creatinine Clearance: 55.3 mL/min (A) (by C-G formula based on SCr of 1.02 mg/dL (H)). Liver Function Tests: Recent Labs  Lab 04/06/21 0931  ALBUMIN 2.5*   No results for input(s): LIPASE, AMYLASE in the last 168 hours. No results for input(s): AMMONIA in the last 168 hours.  Coagulation profile Recent Labs  Lab 04/07/21 0615  INR 1.1   COVID-19 Labs  No results for input(s): DDIMER, FERRITIN, LDH, CRP in the last 72 hours.  Lab Results  Component Value Date   SARSCOV2NAA NEGATIVE 04/11/2021   [place] NEGATIVE 04/04/2021    [place] NEGATIVE 03/13/2021   [place] NEGATIVE 03/10/2021    CBC: Recent Labs  Lab 04/05/21 0333 04/06/21 0931 04/07/21 0615  WBC 4.3 5.0 5.7  NEUTROABS  --  2.7  --   HGB 9.2* 11.2* 10.9*  HCT 29.8* 37.9 36.1  MCV 90.0 91.8 90.3  PLT 110* 145* 147*   Cardiac Enzymes: Recent Labs  Lab 04/09/21 0508  CKTOTAL 14*   BNP (last 3 results) No results for input(s): PROBNP in the last 8760 hours. CBG: Recent Labs  Lab 04/10/21 1225 04/10/21 1626 04/10/21 2102 04/11/21 0628 04/11/21 1238  GLUCAP 166* 121* 148* 202* 141*   D-Dimer: No results for input(s): DDIMER in the last 72 hours. Hgb A1c: No results for input(s): HGBA1C in the last 72 hours. Lipid Profile: No results for input(s): CHOL, HDL, LDLCALC, TRIG, CHOLHDL, LDLDIRECT in the last 72 hours. Thyroid function studies: No results for input(s): TSH, T4TOTAL, T3FREE, THYROIDAB in the last 72 hours.  Invalid input(s): FREET3  Anemia work up: No results for input(s): VITAMINB12, FOLATE, FERRITIN, TIBC,  IRON, RETICCTPCT in the last 72 hours. Sepsis Labs: Recent Labs  Lab 04/04/21 1814 04/05/21 0333 04/06/21 0931 04/07/21 0615  WBC  --  4.3 5.0 5.7  LATICACIDVEN 1.8  --   --   --    Microbiology Recent Results (from the past 240 hour(s))  Blood Culture (routine x 2)     Status: None   Collection Time: 04/04/21 10:40 AM   Specimen: BLOOD LEFT HAND  Result Value Ref Range Status   Specimen Description BLOOD LEFT HAND  Final   Special Requests   Final    BOTTLES DRAWN AEROBIC AND ANAEROBIC Blood Culture adequate volume   Culture   Final    NO GROWTH 5 DAYS Performed at [facility], [address], [place], [place] [address]    Report Status 04/09/2021 FINAL  Final  Resp Panel by RT-PCR (Flu A&B, Covid) Nasopharyngeal Swab     Status: None   Collection Time: 04/04/21 11:00 AM   Specimen: Nasopharyngeal Swab; Nasopharyngeal(NP) swabs in vial transport medium  Result Value Ref Range Status    SARS Coronavirus 2 by RT PCR NEGATIVE NEGATIVE Final    Comment: (NOTE) SARS-CoV-2 target nucleic acids are NOT DETECTED.  The SARS-CoV-2 RNA is generally detectable in upper respiratory specimens during the acute phase of infection. The lowest concentration of SARS-CoV-2 viral copies this assay can detect is 138 copies/mL. A negative result does not preclude SARS-Cov-2 infection and should not be used as the sole basis for treatment or other patient management decisions. A negative result may occur with  improper specimen collection/handling, submission of specimen other than nasopharyngeal swab, presence of viral mutation(s) within the areas targeted by this assay, and inadequate number of viral copies(<138 copies/mL). A negative result must be combined with clinical observations, patient history, and epidemiological information. The expected result is Negative.  Fact Sheet for Patients:  EntrepreneurPulse.com.au  Fact Sheet for Healthcare Providers:  IncredibleEmployment.be  This test is no t yet approved or cleared by the [place] FDA and  has been authorized for detection and/or diagnosis of SARS-CoV-2 by FDA under an Emergency Use Authorization (EUA). This EUA will remain  in effect (meaning this test can be used) for the duration of the COVID-19 declaration under Section 564(b)(1) of the Act, 21 U.S.C.section 360bbb-3(b)(1), unless the authorization is terminated  or revoked sooner.       Influenza A by PCR NEGATIVE NEGATIVE Final   Influenza B by PCR NEGATIVE NEGATIVE Final    Comment: (NOTE) The Xpert Xpress SARS-CoV-2/FLU/RSV plus assay is intended as an aid in the diagnosis of influenza from Nasopharyngeal swab specimens and should not be used as a sole basis for treatment. Nasal washings and aspirates are unacceptable for Xpert Xpress SARS-CoV-2/FLU/RSV testing.  Fact Sheet for  Patients: EntrepreneurPulse.com.au  Fact Sheet for Healthcare Providers: IncredibleEmployment.be  This test is not yet approved or cleared by the [place] FDA and has been authorized for detection and/or diagnosis of SARS-CoV-2 by FDA under an Emergency Use Authorization (EUA). This EUA will remain in effect (meaning this test can be used) for the duration of the COVID-19 declaration under Section 564(b)(1) of the Act, 21 U.S.C. section 360bbb-3(b)(1), unless the authorization is terminated or revoked.  Performed at [facility], [place] [address], [place], [place] [address]   Blood Culture (routine x 2)     Status: None   Collection Time: 04/04/21 11:10 AM   Specimen: BLOOD RIGHT FOREARM  Result Value Ref  Range Status   Specimen Description BLOOD RIGHT FOREARM  Final   Special Requests   Final    BOTTLES DRAWN AEROBIC AND ANAEROBIC Blood Culture results may not be optimal due to an inadequate volume of blood received in culture bottles   Culture   Final    NO GROWTH 5 DAYS Performed at [facility], [place] [address], [place], [place] [address]    Report Status 04/09/2021 FINAL  Final  Aerobic/Anaerobic Culture w Gram Stain (surgical/deep wound)     Status: None (Preliminary result)   Collection Time: 04/07/21 12:48 PM   Specimen: Back; Wound  Result Value Ref Range Status   Specimen Description BACK LOWER LUMBAR  Final   Special Requests NONE  Final   Gram Stain   Final    MODERATE WBC PRESENT, PREDOMINANTLY MONONUCLEAR RARE GRAM NEGATIVE RODS    Culture   Final    NO GROWTH 4 DAYS NO ANAEROBES ISOLATED; CULTURE IN PROGRESS FOR 5 DAYS Performed at [facility], [address], [place], [place] [address]    Report Status PENDING  Incomplete  Aerobic/Anaerobic Culture w Gram Stain (surgical/deep wound)     Status: None (Preliminary result)   Collection Time: 04/07/21 12:49 PM   Specimen: Back; Wound  Result Value Ref  Range Status   Specimen Description BACK UPPER LUMBAR  Final   Special Requests NONE  Final   Gram Stain FEW WBC SEEN NO ORGANISMS SEEN   Final   Culture   Final    NO GROWTH 4 DAYS NO ANAEROBES ISOLATED; CULTURE IN PROGRESS FOR 5 DAYS Performed at [facility], [place] [address], [place], [place] [address]    Report Status PENDING  Incomplete  Resp Panel by RT-PCR (Flu A&B, Covid) Nasopharyngeal Swab     Status: None   Collection Time: 04/11/21 10:23 AM   Specimen: Nasopharyngeal Swab; Nasopharyngeal(NP) swabs in vial transport medium  Result Value Ref Range Status   SARS Coronavirus 2 by RT PCR NEGATIVE NEGATIVE Final    Comment: (NOTE) SARS-CoV-2 target nucleic acids are NOT DETECTED.  The SARS-CoV-2 RNA is generally detectable in upper respiratory specimens during the acute phase of infection. The lowest concentration of SARS-CoV-2 viral copies this assay can detect is 138 copies/mL. A negative result does not preclude SARS-Cov-2 infection and should not be used as the sole basis for treatment or other patient management decisions. A negative result may occur with  improper specimen collection/handling, submission of specimen other than nasopharyngeal swab, presence of viral mutation(s) within the areas targeted by this assay, and inadequate number of viral copies(<138 copies/mL). A negative result must be combined with clinical observations, patient history, and epidemiological information. The expected result is Negative.  Fact Sheet for Patients:  EntrepreneurPulse.com.au  Fact Sheet for Healthcare Providers:  IncredibleEmployment.be  This test is no t yet approved or cleared by the [place] FDA and  has been authorized for detection and/or diagnosis of SARS-CoV-2 by FDA under an Emergency Use Authorization (EUA). This EUA will remain  in effect (meaning this test can be used) for the duration of the COVID-19 declaration  under Section 564(b)(1) of the Act, 21 U.S.C.section 360bbb-3(b)(1), unless the authorization is terminated  or revoked sooner.       Influenza A by PCR NEGATIVE NEGATIVE Final   Influenza B by PCR NEGATIVE NEGATIVE Final    Comment: (NOTE) The Xpert Xpress SARS-CoV-2/FLU/RSV plus assay is intended as an aid in the diagnosis  of influenza from Nasopharyngeal swab specimens and should not be used as a sole basis for treatment. Nasal washings and aspirates are unacceptable for Xpert Xpress SARS-CoV-2/FLU/RSV testing.  Fact Sheet for Patients: EntrepreneurPulse.com.au  Fact Sheet for Healthcare Providers: IncredibleEmployment.be  This test is not yet approved or cleared by the [place] FDA and has been authorized for detection and/or diagnosis of SARS-CoV-2 by FDA under an Emergency Use Authorization (EUA). This EUA will remain in effect (meaning this test can be used) for the duration of the COVID-19 declaration under Section 564(b)(1) of the Act, 21 U.S.C. section 360bbb-3(b)(1), unless the authorization is terminated or revoked.  Performed at [facility], [place] [address], [place], [place] [address]      Medications:    aspirin  81 mg Oral q morning   atorvastatin  40 mg Oral QHS   enoxaparin (LOVENOX) injection  40 mg Subcutaneous Q24H   gabapentin  600 mg Oral TID   insulin aspart  0-15 Units Subcutaneous TID WC   insulin aspart  0-5 Units Subcutaneous QHS   insulin aspart  4 Units Subcutaneous TID WC   insulin glargine-yfgn  10 Units Subcutaneous QHS   metoCLOPramide  5 mg Oral TID AC & HS   midodrine  30 mg Oral TID with meals   morphine  15 mg Oral Q12H   pantoprazole  40 mg Oral BID   polyethylene glycol  17 g Oral q morning   QUEtiapine  25 mg Oral QHS   sertraline  100 mg Oral q morning   temazepam  30 mg Oral QHS   Continuous Infusions:  DAPTOmycin (CUBICIN)  IV 600 mg (04/10/21 2111)   ertapenem 1,000 mg  (04/10/21 2215)      LOS: 7 days   Florencia Reasons MD PhD FACP [facility]  04/11/2021, 4:51 PM

## 2021-04-11 NOTE — Progress Notes (Signed)
Peripherally Inserted Central Catheter Placement  The IV Nurse has discussed with the patient and/or persons authorized to consent for the patient, the purpose of this procedure and the potential benefits and risks involved with this procedure.  The benefits include less needle sticks, lab draws from the catheter, and the patient may be discharged home with the catheter. Risks include, but not limited to, infection, bleeding, blood clot (thrombus formation), and puncture of an artery; nerve damage and irregular heartbeat and possibility to perform a PICC exchange if needed/ordered by physician.  Alternatives to this procedure were also discussed.  Bard Power PICC patient education guide, fact sheet on infection prevention and patient information card has been provided to patient /or left at bedside.  PICC placed by Thelma Comp, RN  PICC Placement Documentation  PICC Single Lumen 04/11/21 Right Basilic 37 cm 0 cm (Active)  Indication for Insertion or Continuance of Line Home intravenous therapies (PICC only) 04/11/21 2253  Exposed Catheter (cm) 0 cm 04/11/21 2253  Site Assessment Clean;Dry;Intact 04/11/21 2253  Line Status Flushed;Saline locked;Blood return noted 04/11/21 2253  Dressing Type Transparent 04/11/21 2253  Dressing Status Clean;Dry;Intact 04/11/21 2253  Antimicrobial disc in place? Yes 04/11/21 2253  Line Care Connections checked and tightened 04/11/21 2253  Line Adjustment (NICU/IV Team Only) No 04/11/21 2253  Dressing Intervention Terra dressing 04/11/21 2253  Dressing Change Due 04/18/21 04/11/21 2253       Marlies Ligman, Lajean Manes 04/11/2021, 10:54 PM

## 2021-04-12 DIAGNOSIS — G9341 Metabolic encephalopathy: Secondary | ICD-10-CM | POA: Diagnosis not present

## 2021-04-12 DIAGNOSIS — E1140 Type 2 diabetes mellitus with diabetic neuropathy, unspecified: Secondary | ICD-10-CM | POA: Diagnosis not present

## 2021-04-12 DIAGNOSIS — G4733 Obstructive sleep apnea (adult) (pediatric): Secondary | ICD-10-CM

## 2021-04-12 DIAGNOSIS — G061 Intraspinal abscess and granuloma: Secondary | ICD-10-CM | POA: Diagnosis not present

## 2021-04-12 LAB — GLUCOSE, CAPILLARY
Glucose-Capillary: 168 mg/dL — ABNORMAL HIGH (ref 70–99)
Glucose-Capillary: 142 mg/dL — ABNORMAL HIGH (ref 70–99)
Glucose-Capillary: 126 mg/dL — ABNORMAL HIGH (ref 70–99)
Glucose-Capillary: 109 mg/dL — ABNORMAL HIGH (ref 70–99)

## 2021-04-12 LAB — AEROBIC/ANAEROBIC CULTURE W GRAM STAIN (SURGICAL/DEEP WOUND): Culture: NO GROWTH

## 2021-04-12 MED ORDER — FUROSEMIDE 40 MG PO TABS: 40.0000 mg | ORAL_TABLET | ORAL | Status: DC

## 2021-04-12 MED ORDER — OXYCODONE HCL 10 MG PO TABS: 10.0000 mg | ORAL_TABLET | Freq: Three times a day (TID) | ORAL | 0 refills | Status: AC | PRN

## 2021-04-12 MED ORDER — FUROSEMIDE 40 MG PO TABS: 40.0000 mg | ORAL_TABLET | Freq: Every day | ORAL | 0 refills | Status: AC | PRN

## 2021-04-12 MED ORDER — ERTAPENEM IV (FOR PTA / DISCHARGE USE ONLY): 1.0000 g | INTRAVENOUS | 0 refills | Status: AC

## 2021-04-12 MED ORDER — FUROSEMIDE 40 MG PO TABS: 40.0000 mg | ORAL_TABLET | ORAL | Status: AC

## 2021-04-12 MED ORDER — DAPTOMYCIN IV (FOR PTA / DISCHARGE USE ONLY): 600.0000 mg | INTRAVENOUS | 0 refills | Status: AC

## 2021-04-12 MED ORDER — MORPHINE SULFATE ER 15 MG PO TBCR: 15.0000 mg | EXTENDED_RELEASE_TABLET | Freq: Two times a day (BID) | ORAL | 0 refills | Status: AC

## 2021-04-12 MED ORDER — POTASSIUM CHLORIDE CRYS ER 20 MEQ PO TBCR: 20.0000 meq | EXTENDED_RELEASE_TABLET | ORAL | Status: AC

## 2021-04-12 MED ORDER — INSULIN GLARGINE 100 UNIT/ML ~~LOC~~ SOLN: 30.0000 [IU] | Freq: Every day | SUBCUTANEOUS | 11 refills | Status: DC

## 2021-04-12 MED ORDER — SACCHAROMYCES BOULARDII 250 MG PO CAPS: 250.0000 mg | ORAL_CAPSULE | Freq: Two times a day (BID) | ORAL | Status: AC

## 2021-04-12 MED ORDER — FERROUS SULFATE 325 (65 FE) MG PO TABS: 325.0000 mg | ORAL_TABLET | ORAL | 3 refills | Status: AC

## 2021-04-12 NOTE — Discharge Summary (Addendum)
Discharge Summary  Tonya Baker JSH:[id number] DOB: 1950-10-07  PCP: Garwin Brothers, MD  Admit date: 04/04/2021 Discharge date: 04/12/2021  Time spent: 75mins, more than 50% time spent on coordination of care.   Recommendations for Outpatient Follow-up:  F/u with SNF MD  for hospital discharge follow up, repeat cbc/bmp at follow up. 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    Discharge Condition: stable  Diet recommendation: carb modified  Filed Weights   04/04/21 1029  Weight: 86.2 kg    History of present illness: (Per admitting MD Dr. Roosevelt Locks) Chief Complaint: Back pain and fever   HPI: Tonya Baker is a 71 y.o. female with medical history significant of IIDM, diabetic neuropathy, orthostatic hypotension on midodrine, HTN, HLD, obesity, presented with sepsis.   Patient is confused unable to provide any history.  Most history obtained by review of ED chart.  Patient lives at nursing home, and recently patient underwent back surgery on 12/05 with revision of L5/S1 pseudoarthrosis, patient was discharged on 12/09 on stable conditions.   After discharge, patient was not able to follow-up with surgical team.  It appears that patient started to have back pain and fever last night.  EMS arrived this morning found patient feverish temperature 102, and was given IV fluid bolus  500.   Patient complaining about back pain, denies any weakness or numbness of the lower extremities, denies any urinary problems or bowel movement problems.   ED Course: Blood pressure low, and received total of 2 L IV boluses blood pressure still borderline low.  Patient remains confused oriented only to person and place confused about time.   CT abdomen pelvis showed large 2.7 x 4.0 x 2.6 cm subcutaneous abscess in the midline but extending from T11-L3.   Lactic acid 2.0, WBC 11.2, creatinine 1.4 compared to baseline 0.98, antibiotics including vancomycin and Zosyn.  Neurosurgery consulted.    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   Acute metabolic encephalopathy   Diabetic neuropathy ([facility])  Severe sepsis due to abscess of her lumbar spine: improved Blood cultures negative till date Status post CT-guided aspiration on 04/07/2021 gram stain with G-rods,  cultures no growth so far Seen by neurosurgery per note from 1/3 "Given that it has been drained and there was no purulence, along with the MRI findings lacking evidence of an abscess, I do not think there is an indication to reopen her wound" -ID consulted recommend picc placement for iv daptomycin and ertapenem Per ID" Anticipate 8 weeks of antibiotics from OR for vertebral infection (EOT 05/31/21) followed by chronic suppression with doxycyline. Aspirate  Cx are negative(following 3 days of IV antibiotics) as such would not broaden coverage for suppressive antibiotics."   outpatient follow up with ID scheduled    Acute metabolic encephalopathy: resolved  Secondary to sepsis, CT scan of the head showed no acute findings. Appear oriented x3 but having trouble  finding the  right words States this is her baseline, it happens sometime when she just wake up, she does not want to have mri brain currently   Insulin-dependent diabetes mellitus type 2: Blood glucose is well controlled.  A1c 6.6 Her blood Glucose is well controlled here in the hospital with 10 units of long-acting insulin plus SSI -Patient report sshe is on 60 units of long-acting insulin at the facility -I am hesitating to resume her back to 60 unit, long acting insulin,  will discharge her on 30 units long-acting insulin plus sliding scale -Suspect diet discrepancy -SNF MD to further adjust insulin as needed   Diabetic neuropathy, anxiety/depression: Neurontin has been resumed.   Autonomic dysfunction Resume midodrine.   Diabetic neuropathy: Resume gabapentin.  Hypokalemia: Replaced    Acute thrombocytopenia: Recheck a CBC today question due to infectious etiology. Improving, monitor  She reports has been on lasix daily prior to admission, she did not get any lasix here in the hospital, there is no volume overload on exam, will resume lasix at lower frequency to every other day, with lasix daily prn for edema, SNF MD to continue monitor volume status, adjust lasix dose as needed      Obstructive sleep apnea: Using CPAP at night.  Body mass index is 32.61 kg/m.   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 If you have any questions about your discharge medications or the care you received  while you were in the hospital after you are discharged, you can call the unit and asked to speak with the hospitalist on call if the hospitalist that took care of you is not available. Once you are discharged, your primary care physician will handle any further medical issues. Please note that NO REFILLS for any discharge medications will be authorized once you are discharged, as it is imperative that you return to your primary care physician (or establish a relationship with a primary care physician if you do not have one) for your aftercare needs so that they can reassess your need for medications and monitor your lab values.  Discharge Instructions     [facility] pharmacist to adjust dose for Vancomycin, Aminoglycosides and other anti-infective therapies as requested by physician.   Complete by: As directed    [facility] to provide Cath Flo $Remove'2mg'SyMzTON$    Complete by: As directed    Administer for PICC line occlusion and as ordered by physician for other access device issues.   Anaphylaxis Kit: Provided to treat any anaphylactic reaction to the medication being provided to the patient if First  Dose or when requested by physician   Complete by: As directed    Epinephrine 1mg /ml vial / amp: Administer 0.3mg  (0.73ml) subcutaneously once for moderate to severe anaphylaxis, nurse to call physician and pharmacy when reaction occurs and call 911 if needed for immediate care   Diphenhydramine 50mg /ml IV vial: Administer 25-50mg  IV/IM PRN for first dose reaction, rash, itching, mild reaction, nurse to call physician and pharmacy when reaction occurs   Sodium Chloride 0.9% NS 553ml IV: Administer if needed for hypovolemic blood pressure drop or as ordered by physician after call to physician with anaphylactic reaction   Change dressing on IV access line weekly and PRN   Complete by: As directed    Diet Carb Modified   Complete by: As directed    Discharge wound care:   Complete by: As  directed    Pressure offloading   Flush IV access with Sodium Chloride 0.9% and Heparin 10 units/ml or 100 units/ml   Complete by: As directed    Home infusion instructions - [facility]   Complete by: As directed    Instructions: Flush IV access with Sodium Chloride 0.9% and Heparin 10units/ml or 100units/ml   Change dressing on IV access line: Weekly and PRN   Instructions Cath Flo 2mg : Administer for PICC Line occlusion and as ordered by physician for other access device   [facility] pharmacist to adjust dose for: Vancomycin, Aminoglycosides and other anti-infective therapies as requested by physician   Increase activity slowly   Complete by: As directed    Method of administration may be changed at the discretion of home infusion pharmacist based upon assessment of the patient and/or caregivers ability to self-administer the medication ordered   Complete by: As directed       Allergies as of 04/12/2021       Reactions   Duloxetine Hcl Other (See Comments)   tremors   Cortisone Other (See Comments)   Unknown reaction   Hydrocortisone Other (See Comments)   Unknown reaction   Latex Other (See Comments)   Unknown reaction        Medication List     TAKE these medications    acetaminophen 325 MG tablet Commonly known as: TYLENOL Take 650 mg by mouth every 6 (six) hours as needed (pain).   albuterol (2.5 MG/3ML) 0.083% nebulizer solution Commonly known as: PROVENTIL Take 2.5 mg by nebulization every 8 (eight) hours as needed (congestion).   aspirin 81 MG chewable tablet Chew 81 mg by mouth every morning.   atorvastatin 40 MG tablet Commonly known as: LIPITOR Take 40 mg by mouth at bedtime. 2100   bisacodyl 10 MG suppository Commonly known as: DULCOLAX Place 10 mg rectally daily as needed (constipation).   cholecalciferol 25 MCG (1000 UNIT) tablet Commonly known as: VITAMIN D3 Take 1,000 Units by mouth every morning.   cyclobenzaprine  10 MG tablet Commonly known as: FLEXERIL Take 10 mg by mouth 3 (three) times daily.   daptomycin  IVPB Commonly known as: CUBICIN Inject 600 mg into the vein daily. Indication:  Vertebral infection with retained hardware First Dose: Yes Last Day of Therapy:  05/31/2021 Labs - Once weekly:  CBC/D, BMP, and CPK Labs - Every other week:  ESR and CRP Method of administration: IV Push Method of administration may be changed at the discretion of home infusion pharmacist based upon assessment of the patient and/or caregiver's ability to self-administer the medication ordered.   docusate sodium 100 MG capsule  Commonly known as: COLACE Take 100 mg by mouth every morning.   doxycycline 100 MG capsule Commonly known as: VIBRAMYCIN Take 100 mg by mouth every 12 (twelve) hours. 2100   ertapenem  IVPB Commonly known as: INVANZ Inject 1 g into the vein daily. Indication:  Vertebral infection with retained hardware First Dose: Yes Last Day of Therapy:  05/31/2021 Labs - Once weekly:  CBC/D and BMP, Labs - Every other week:  ESR and CRP Method of administration: Mini-Bag Plus / Gravity Method of administration may be changed at the discretion of home infusion pharmacist based upon assessment of the patient and/or caregiver's ability to self-administer the medication ordered.   eucerin cream Apply 1 application topically daily.   ferrous sulfate 325 (65 FE) MG tablet Take 1 tablet (325 mg total) by mouth every Monday, Wednesday, and Friday. Start taking on: April 14, 2021 What changed: when to take this   furosemide 40 MG tablet Commonly known as: Lasix Take 1 tablet (40 mg total) by mouth daily as needed for fluid or edema. What changed: You were already taking a medication with the same name, and this prescription was added. Make sure you understand how and when to take each.   furosemide 40 MG tablet Commonly known as: LASIX Take 1 tablet (40 mg total) by mouth every other day. What  changed: when to take this   gabapentin 300 MG capsule Commonly known as: NEURONTIN Take 600 mg by mouth 3 (three) times daily.   insulin glargine 100 UNIT/ML injection Commonly known as: LANTUS Inject 0.3 mLs (30 Units total) into the skin daily. Takes Lantus between 0800- 0900. What changed:  how much to take Another medication with the same name was removed. Continue taking this medication, and follow the directions you see here.   insulin lispro 100 UNIT/ML injection Commonly known as: HUMALOG Inject 2-10 Units into the skin 4 (four) times daily -  before meals and at bedtime. Sliding Scale:   CBG 151-200 2 units, 201-250 4 units, 251-300 6 units, 301-350 8 units, 351-400 10 units,   magnesium citrate Soln Take 30 mLs by mouth every 12 (twelve) hours as needed (constipation).   metoCLOPramide 5 MG tablet Commonly known as: REGLAN Take 5 mg by mouth 4 (four) times daily -  before meals and at bedtime.   midodrine 10 MG tablet Commonly known as: PROAMATINE Take 3 tablets (30 mg total) by mouth 3 (three) times daily. What changed: how much to take   morphine 15 MG 12 hr tablet Commonly known as: MS CONTIN Take 1 tablet (15 mg total) by mouth every 12 (twelve) hours for 7 days.   NovoLOG FlexPen 100 UNIT/ML FlexPen Generic drug: insulin aspart Inject 2-10 Units into the skin See admin instructions. Inject as per sliding scale: 151-200 = 2   201-250 = 4   251-300 = 6   301-350 = 8   351-400 = 10.   Oxycodone HCl 10 MG Tabs Take 1 tablet (10 mg total) by mouth every 8 (eight) hours as needed for up to 7 days (leg pain).   pantoprazole 40 MG tablet Commonly known as: PROTONIX Take 40 mg by mouth 2 (two) times daily.   polyethylene glycol 17 g packet Commonly known as: MIRALAX / GLYCOLAX Take 17 g by mouth every morning.   potassium chloride SA 20 MEQ tablet Commonly known as: KLOR-CON M Take 1 tablet (20 mEq total) by mouth every other day. What changed: when to take  this   QUEtiapine 25 MG tablet Commonly known as: SEROQUEL Take 25 mg by mouth at bedtime. 2100   saccharomyces boulardii 250 MG capsule Commonly known as: FLORASTOR Take 1 capsule (250 mg total) by mouth 2 (two) times daily.   sertraline 100 MG tablet Commonly known as: ZOLOFT Take 100 mg by mouth every morning.   temazepam 30 MG capsule Commonly known as: RESTORIL Take 30 mg by mouth at bedtime.   Trulicity 9.38 HW/2.9HB Sopn Generic drug: Dulaglutide Inject 0.75 mg into the skin every Monday.               Discharge Care Instructions  (From admission, onward)           Start     Ordered   04/12/21 0000  Change dressing on IV access line weekly and PRN  (Home infusion instructions - [facility] )        04/12/21 0912   04/12/21 0000  Discharge wound care:       Comments: Pressure offloading   04/12/21 0913           Allergies  Allergen Reactions   Duloxetine Hcl Other (See Comments)     tremors   Cortisone Other (See Comments)    Unknown reaction   Hydrocortisone Other (See Comments)    Unknown reaction   Latex Other (See Comments)    Unknown reaction    Follow-up Information     Garwin Brothers, MD .   Specialty: Internal Medicine Contact information: [place] [address] [phone]         Judith Part, MD Follow up.   Specialty: Neurosurgery Contact information: [place] [address] [phone]         Laurice Record, MD Follow up.   Specialty: Infectious Diseases Contact information: [address] [place]  [place] [address] [phone]                  The results of significant diagnostics from this hospitalization (including imaging, microbiology, ancillary and laboratory) are listed below for reference.    Significant Diagnostic Studies: CT HEAD WO CONTRAST (5MM)  Result Date: 04/04/2021 CLINICAL DATA:  Mental status change, unknown cause  EXAM: CT HEAD WITHOUT CONTRAST TECHNIQUE: Contiguous axial images were obtained from the base of the skull through the vertex without intravenous contrast. COMPARISON:  Head CT 03/11/2021 FINDINGS: Brain: No evidence of acute intracranial hemorrhage or extra-axial collection.No evidence of mass lesion/concern mass effect.The ventricles are normal in size. Vascular: No hyperdense vessel or unexpected calcification. Skull: Normal. Negative for fracture or focal lesion. Sinuses/Orbits: No acute finding. Other: None. IMPRESSION: No acute intracranial abnormality. Electronically Signed   By: Maurine Simmering M.D.   On: 04/04/2021 13:26   MR THORACIC SPINE WO CONTRAST  Result Date: 04/04/2021 CLINICAL DATA:  Initial evaluation for acute back pain, fever. History of prior lumbar surgery. Evaluate for epidural abscess. EXAM: MRI THORACIC SPINE WITHOUT CONTRAST TECHNIQUE: Multiplanar, multisequence MR imaging of the thoracic spine was performed. No intravenous contrast was administered. COMPARISON:  Prior CT from earlier the same day. FINDINGS: Alignment: Straightening of the normal thoracic kyphosis. No listhesis. Vertebrae: Susceptibility artifact related to prior posterior fusion partially visualized extending from L1 inferiorly. Vertebral body height maintained without acute or chronic fracture. Bone marrow signal intensity within normal limits. Few scattered benign hemangiomata noted, most prominent of which present within the T7 and T12 vertebral bodies. No worrisome  osseous lesions. No findings to suggest osteomyelitis discitis or septic arthritis. Cord: Normal signal and morphology. No epidural abscess or other collection. Paraspinal and other soft tissues: Postsurgical changes related to recent lumbar surgery partially visualized within the posterior soft tissues of the visualized upper back. Partially visualized collection along the surgical incision measures 2.5 x 3.7 cm (series 10, image 37). Remainder of the  visualized paraspinal soft tissues otherwise unremarkable about the thoracic spine. Trace layering bilateral pleural effusions noted. Visualized visceral structures otherwise unremarkable. Disc levels: Coronary for age multilevel disc desiccation present throughout the thoracic spine. Minimal noncompressive disc bulging noted at T10-11 through T12-L1. Scattered thoracic facet hypertrophy noted throughout the mid and lower thoracic spine. No significant spinal stenosis. Mild to moderate bilateral foraminal narrowing noted at T7-8 through T9-10, largely due to facet disease. No frank neural impingement. IMPRESSION: 1. No acute abnormality within the thoracic spine. 2. Postsurgical changes related to recent lumbar surgery, with partially visualized collection along the posterior surgical incision. While this finding may simply reflect a benign postoperative seroma, superimposed infection is difficult to exclude, and could be considered in the correct clinical setting. Correlation with history, laboratory values, and symptomatology recommended. 3. No other acute abnormality or evidence for infection elsewhere within the thoracic spine. 4. Mild for age multilevel thoracic spondylosis without significant stenosis or neural impingement. Electronically Signed   By: Jeannine Boga M.D.   On: 04/04/2021 22:55   MR LUMBAR SPINE WO CONTRAST  Result Date: 04/04/2021 CLINICAL DATA:  Initial evaluation for lower back pain, fever, history of recent lumbar surgery. Evaluate for epidural abscess. EXAM: MRI LUMBAR SPINE WITHOUT CONTRAST TECHNIQUE: Multiplanar, multisequence MR imaging of the lumbar spine was performed. No intravenous contrast was administered. COMPARISON:  Comparison made with prior CT from earlier the same day as well as previous MRI from 12/28/2020. FINDINGS: Segmentation: Standard. Lowest well-formed disc space labeled the L5-S1 level. Alignment: Mild levoscoliosis. Trace chronic retrolisthesis of L1 on  L2 through L3 on L4, with additional trace retrolisthesis of T11 on T12, stable. Vertebrae: Susceptibility artifact from prior posterior fusion at L1 through the sacrum, with bilateral transpedicular screws in place at all levels. Interbody fusion in place at L4-5 and L5-S1. Hardware better evaluated on prior CT. Chronic height loss with wedging deformity of L3, stable. Vertebral body height otherwise maintained without acute or interval fracture. Visualized bone marrow signal intensity within normal limits. Benign hemangioma noted within the T12 vertebral body. No worrisome osseous lesions. Previously seen reactive marrow edema about the L5-S1 interspace is improved as compared to prior MRI. No other signal changes to suggest osteomyelitis discitis or septic arthritis. Conus medullaris and cauda equina: Conus extends to the T12-L1 level. Conus and cauda equina appear normal. No evidence for arachnoiditis. No epidural abscess or other collection. Paraspinal and other soft tissues: Postoperative changes from recent surgery present throughout the lower posterior paraspinous soft tissues. Residual collection seen extending along the lower midline incision measures approximately 2.6 x 3.6 x 8.8 cm (AP by transverse by craniocaudad). The inferior aspect of this collection demonstrate subfascial extension, communicating within additional loculated collection at the laminectomy defect extending from L2 through L5 (series 6, image 21). This second deeper loculated collection measures approximately 3.4 x 2.1 x 6.9 cm (transverse by AP by craniocaudad) (series 6, image 23). No significant inflammatory changes seen about these collections, which are favored to reflect benign postoperative seromas. Superimposed infection difficult to exclude, and could be considered in the correct clinical setting. Disc levels:  L1-2: Trace retrolisthesis with degenerative intervertebral disc space narrowing, diffuse disc bulge, and disc  desiccation. Prior posterior fusion. Residual mild facet hypertrophy. No spinal stenosis. Foramina appear patent. L2-3: Chronic retrolisthesis with advanced degenerative intervertebral disc space narrowing. Diffuse disc bulge with disc desiccation and reactive endplate spurring. Residual moderate facet hypertrophy. Prior posterior decompression with fusion. Thecal sac remains patent. No significant foraminal encroachment. L3-4: Trace chronic retrolisthesis. Advanced degenerative intervertebral disc space narrowing with disc desiccation and diffuse disc bulge. Reactive endplate spurring. Prior posterior decompression with fusion. Residual moderate facet hypertrophy. Residual mild to moderate left lateral recess stenosis, stable (series 6, image 16). Thecal sac otherwise patent. Mild bilateral L3 foraminal narrowing, grossly stable. L4-5: Prior posterior and interbody fusion with posterior decompression. No residual spinal stenosis. Foramina remain patent. L5-S1: Prior posterior and interbody fusion with posterior decompression. No residual spinal stenosis. Foramina appear grossly patent. IMPRESSION: 1. Postoperative changes from recent surgery with two contiguous loculated collections at the laminectomy defect and along the lower midline incision as detailed above. Given overall appearance, findings favored to reflect benign postoperative seromas. Superimposed infection difficult to exclude, and could be considered in the correct clinical setting. 2. No other evidence for acute infection within the lumbar spine. No evidence for osteomyelitis discitis, septic arthritis, or epidural abscess. 3. Postoperative changes from prior posterior and interbody fusion at L1 through the sacrum as detailed above. Residual mild to moderate left lateral recess narrowing at L3-4. No other significant residual stenosis. Electronically Signed   By: Jeannine Boga M.D.   On: 04/04/2021 23:42   CT ABDOMEN PELVIS W  CONTRAST  Result Date: 04/04/2021 CLINICAL DATA:  Fever and back pain. Altered mental status. Concern for sepsis. Recent lumbar surgery with drainage from the surgical site. EXAM: CT ABDOMEN AND PELVIS WITH CONTRAST TECHNIQUE: Multidetector CT imaging of the abdomen and pelvis was performed using the standard protocol following bolus administration of intravenous contrast. CONTRAST:  64mL OMNIPAQUE IOHEXOL 350 MG/ML SOLN COMPARISON:  None. FINDINGS: Lower chest: No acute abnormality. Hepatobiliary: No focal liver abnormality is seen. Status post cholecystectomy. No biliary dilatation. Pancreas: Moderate atrophy. No ductal dilatation or surrounding inflammatory changes. Spleen: Mildly enlarged.  No focal abnormality. Adrenals/Urinary Tract: Unchanged homogeneously enhancing 1.1 cm right adrenal nodule, indeterminate on prior CTs. The left adrenal gland is unremarkable. Kidneys are normal without mass, calculi, or hydronephrosis. Trace focus of air within the bladder likely related to recent instrumentation. The bladder is otherwise unremarkable. Stomach/Bowel: Stomach is within normal limits. Appendix appears normal. No evidence of bowel wall thickening, distention, or inflammatory changes. Vascular/Lymphatic: Aortoiliac atherosclerotic vascular disease. Prominent subcentimeter right inguinal and external iliac lymph nodes are likely reactive. Reproductive: Uterus and bilateral adnexa are unremarkable. Other: No free fluid or pneumoperitoneum. Musculoskeletal: Prior L1-S1 fusion with interval revision at L5-S1 and Alkema extension of the fusion to the pelvis. Unchanged lucency around the L5-S1 interbody graft with adjacent endplate sclerosis. Continued lucency around the S1 screws. No progressive bony destruction. Similar scarring in the posterior midline lumbar soft tissues with Belknap 2.7 x 4.0 x 10.6 cm thick-walled rim enhancing gas and fluid collection in the midline extending from T11 to L3. IMPRESSION: 1. Gedeon 2.7  x 4.0 x 10.6 cm subcutaneous abscess in the midline back extending from T11 to L3. 2. Prior L1-S1 fusion with interval revision at L5-S1 and Ryker extension of the fusion to the pelvis. Unchanged L5-S1 pseudoarthrosis. 3. Unchanged 1.1 cm indeterminate right adrenal nodule. This is probably benign. Consider follow-up adrenal protocol  CT in 12 months. This recommendation follows ACR consensus guidelines: Management of Incidental Adrenal Masses: A White Paper of the ACR Incidental Findings Committee. J Am Coll Radiol 2017;14:1038-1044. 4. Mild splenomegaly. 5. Aortic Atherosclerosis (ICD10-I70.0). Electronically Signed   By: Titus Dubin M.D.   On: 04/04/2021 13:44   CT ASPIRATION  Result Date: 04/07/2021 INDICATION: Lumbar fusion hardware, paralumbar fluid concern for infection EXAM: CT GUIDANCE NEEDLE PLACEMENT MEDICATIONS: None. ANESTHESIA/SEDATION: Moderate (conscious) sedation was employed during this procedure. A total of Versed 1 mg and Fentanyl 50 mcg was administered intravenously. Moderate Sedation Time: 18 minutes. The patient's level of consciousness and vital signs were monitored continuously by radiology nursing throughout the procedure under my direct supervision. FLUOROSCOPY TIME:  Fluoroscopy Time: N/a COMPLICATIONS: None immediate. PROCEDURE: Informed written consent was obtained from the patient after a thorough discussion of the procedural risks, benefits and alternatives. All questions were addressed. Maximal Sterile Barrier Technique was utilized including caps, mask, sterile gowns, sterile gloves, sterile drape, hand hygiene and skin antiseptic. A timeout was performed prior to the initiation of the procedure. The patient was placed prone on the exam table. Limited CT of the lumbar spine and paralumbar soft tissues was performed for planning purposes. This again demonstrated paralumbar fluid collections. An upper and lower lumbar collection identified. Skin entry sites were marked, and the  overlying skin was prepped and draped in a standard sterile fashion. Local analgesia was obtained with 1% lidocaine. Attention was first turned to the lower lumbar collection. Using intermittent CT fluoroscopy, a 19 gauge Yueh catheter was advanced towards the fluid collection. Approximately 10 mL of thin, pink serosanguineous fluid was aspirated. This was sent to the lab for further analysis. Attention was then turned to the more superior upper lumbar collection. Again using intermittent CT fluoroscopy, a 19 gauge Yueh catheter was advanced towards this fluid collection. A smaller volume of only approximately 2 mL of thin, pink serosanguineous fluid was aspirated. This was also sent to the lab for analysis. Sterile dressings were placed after hemostasis. The patient tolerated the procedure well without immediate complication. IMPRESSION: 1. Successful CT-guided aspiration of lower paralumbar fluid collection with removal of approximately 10 mL of thin, pink serosanguineous fluid with a sample sent to the lab for analysis. 2. Successful CT-guided aspiration of a second more superior upper paralumbar fluid collection with removal of approximately 2 mL of thin, pink serosanguineous fluid with a sample sent to the lab for analysis. Electronically Signed   By: Albin Felling M.D.   On: 04/07/2021 13:39   CT L-SPINE NO CHARGE  Result Date: 04/04/2021 CLINICAL DATA:  Back pain EXAM: CT LUMBAR SPINE WITHOUT CONTRAST TECHNIQUE: Multidetector CT imaging of the lumbar spine was performed without intravenous contrast administration. Multiplanar CT image reconstructions were also generated. COMPARISON:  None. FINDINGS: Segmentation: Normal Alignment: Unchanged alignment. Vertebrae: Stable vertebral body heights with most notable compression deformities at L2, L3, and L4. No evidence of progressive height loss. Paraspinal and other soft tissues: There is a thick-walled rim enhancing collection with punctate foci of gas  along the posterior soft tissues Perine since the prior CT consistent with interval surgery. This measures up to 3.5 x 2.8 cm (series 4, image 15). Disc levels: Unchanged chronic postoperative changes from L1 through L4. Solid interbody arthrodesis from L1 through L4. L4-L5: Posterior decompression, anterior posterior fusion stable in comparison to prior exam. There is a single right-sided L4 pedicle screw. Unchanged degree of developing interbody arthrodesis with mild solid bridging posteriorly. L5-S1:  Interval spinal fusion revision, with replacement of the bilateral S1 screws and a Yager fusion through the pelvis. Quackenbush hardware is intact. There is persistent mild lucency along the S1 screw tracks. Stable positioning of the interbody implant despite this substantial adjacent endplate bone loss. Prior posterior decompression at this level as well. IMPRESSION: Interval lumbosacral fusion revision with bilateral S1 screw replacement and L4 to pelvis posterolateral fusion. Lillibridge hardware is intact. Unchanged L5-S1 pseudoarthrosis. Persistent lucency along the S1 screw tracks. Thick wall rim enhancing collection along the posterior subacute soft tissues consistent recent surgery measuring 3.5 x 2.8 cm. The sterility of this collection is indeterminate by CT. Unchanged interbody arthrodesis from L1 through L4 and mild posterior element arthrodesis at L4-L5. Electronically Signed   By: Maurine Simmering M.D.   On: 04/04/2021 13:43   DG Chest Port 1 View  Result Date: 04/04/2021 CLINICAL DATA:  Questionable sepsis EXAM: PORTABLE CHEST 1 VIEW COMPARISON:  Chest radiograph 03/11/2021 FINDINGS: The right IJ vascular catheter has been removed. The cardiomediastinal silhouette is stable. Lung volumes are markedly diminished. There is no focal consolidation or pulmonary edema. There is no pleural effusion or pneumothorax. There is no acute osseous abnormality. IMPRESSION: Markedly low lung volumes. Otherwise, no radiographic evidence of  acute cardiopulmonary process. Electronically Signed   By: Valetta Mole M.D.   On: 04/04/2021 10:59   [place] EKG SITE RITE  Result Date: 04/11/2021 If Site Rite image not attached, placement could not be confirmed due to current cardiac rhythm.   Microbiology: Recent Results (from the past 240 hour(s))  Blood Culture (routine x 2)     Status: None   Collection Time: 04/04/21 10:40 AM   Specimen: BLOOD LEFT HAND  Result Value Ref Range Status   Specimen Description BLOOD LEFT HAND  Final   Special Requests   Final    BOTTLES DRAWN AEROBIC AND ANAEROBIC Blood Culture adequate volume   Culture   Final    NO GROWTH 5 DAYS Performed at [facility], [address], [place], [place] [address]    Report Status 04/09/2021 FINAL  Final  Resp Panel by RT-PCR (Flu A&B, Covid) Nasopharyngeal Swab     Status: None   Collection Time: 04/04/21 11:00 AM   Specimen: Nasopharyngeal Swab; Nasopharyngeal(NP) swabs in vial transport medium  Result Value Ref Range Status   SARS Coronavirus 2 by RT PCR NEGATIVE NEGATIVE Final    Comment: (NOTE) SARS-CoV-2 target nucleic acids are NOT DETECTED.  The SARS-CoV-2 RNA is generally detectable in upper respiratory specimens during the acute phase of infection. The lowest concentration of SARS-CoV-2 viral copies this assay can detect is 138 copies/mL. A negative result does not preclude SARS-Cov-2 infection and should not be used as the sole basis for treatment or other patient management decisions. A negative result may occur with  improper specimen collection/handling, submission of specimen other than nasopharyngeal swab, presence of viral mutation(s) within the areas targeted by this assay, and inadequate number of viral copies(<138 copies/mL). A negative result must be combined with clinical observations, patient history, and epidemiological information. The expected result is Negative.  Fact Sheet for Patients:   EntrepreneurPulse.com.au  Fact Sheet for Healthcare Providers:  IncredibleEmployment.be  This test is no t yet approved or cleared by the [place] FDA and  has been authorized for detection and/or diagnosis of SARS-CoV-2 by FDA under an Emergency Use Authorization (EUA). This EUA will remain  in effect (meaning this test can be used) for the  duration of the COVID-19 declaration under Section 564(b)(1) of the Act, 21 U.S.C.section 360bbb-3(b)(1), unless the authorization is terminated  or revoked sooner.       Influenza A by PCR NEGATIVE NEGATIVE Final   Influenza B by PCR NEGATIVE NEGATIVE Final    Comment: (NOTE) The Xpert Xpress SARS-CoV-2/FLU/RSV plus assay is intended as an aid in the diagnosis of influenza from Nasopharyngeal swab specimens and should not be used as a sole basis for treatment. Nasal washings and aspirates are unacceptable for Xpert Xpress SARS-CoV-2/FLU/RSV testing.  Fact Sheet for Patients: EntrepreneurPulse.com.au  Fact Sheet for Healthcare Providers: IncredibleEmployment.be  This test is not yet approved or cleared by the [place] FDA and has been authorized for detection and/or diagnosis of SARS-CoV-2 by FDA under an Emergency Use Authorization (EUA). This EUA will remain in effect (meaning this test can be used) for the duration of the COVID-19 declaration under Section 564(b)(1) of the Act, 21 U.S.C. section 360bbb-3(b)(1), unless the authorization is terminated or revoked.  Performed at [facility], [place] [address], [place], [place] [address]   Blood Culture (routine x 2)     Status: None   Collection Time: 04/04/21 11:10 AM   Specimen: BLOOD RIGHT FOREARM  Result Value Ref Range Status   Specimen Description BLOOD RIGHT FOREARM  Final   Special Requests   Final    BOTTLES DRAWN AEROBIC AND ANAEROBIC Blood Culture results may not be optimal due to an  inadequate volume of blood received in culture bottles   Culture   Final    NO GROWTH 5 DAYS Performed at [facility], [place] [address] [place], [place] [address]    Report Status 04/09/2021 FINAL  Final  Aerobic/Anaerobic Culture w Gram Stain (surgical/deep wound)     Status: None (Preliminary result)   Collection Time: 04/07/21 12:48 PM   Specimen: Back; Wound  Result Value Ref Range Status   Specimen Description BACK LOWER LUMBAR  Final   Special Requests NONE  Final   Gram Stain   Final    MODERATE WBC PRESENT, PREDOMINANTLY MONONUCLEAR RARE GRAM NEGATIVE RODS    Culture   Final    NO GROWTH 4 DAYS NO ANAEROBES ISOLATED; CULTURE IN PROGRESS FOR 5 DAYS Performed at [facility], [address] [place], [place] [address]    Report Status PENDING  Incomplete  Aerobic/Anaerobic Culture w Gram Stain (surgical/deep wound)     Status: None (Preliminary result)   Collection Time: 04/07/21 12:49 PM   Specimen: Back; Wound  Result Value Ref Range Status   Specimen Description BACK UPPER LUMBAR  Final   Special Requests NONE  Final   Gram Stain FEW WBC SEEN NO ORGANISMS SEEN   Final   Culture   Final    NO GROWTH 4 DAYS NO ANAEROBES ISOLATED; CULTURE IN PROGRESS FOR 5 DAYS Performed at [facility], [place] [address], [place], [place] [address]    Report Status PENDING  Incomplete  Resp Panel by RT-PCR (Flu A&B, Covid) Nasopharyngeal Swab     Status: None   Collection Time: 04/11/21 10:23 AM   Specimen: Nasopharyngeal Swab; Nasopharyngeal(NP) swabs in vial transport medium  Result Value Ref Range Status   SARS Coronavirus 2 by RT PCR NEGATIVE NEGATIVE Final    Comment: (NOTE) SARS-CoV-2 target nucleic acids are NOT DETECTED.  The SARS-CoV-2 RNA is generally detectable in upper respiratory specimens during the acute phase of infection. The lowest concentration of  SARS-CoV-2 viral copies this assay can detect is 138 copies/mL. A negative result does not preclude  SARS-Cov-2 infection and should not be used as the sole basis for treatment or other patient management decisions. A negative result may occur with  improper specimen collection/handling, submission of specimen other than nasopharyngeal swab, presence of viral mutation(s) within the areas targeted by this assay, and inadequate number of viral copies(<138 copies/mL). A negative result must be combined with clinical observations, patient history, and epidemiological information. The expected result is Negative.  Fact Sheet for Patients:  EntrepreneurPulse.com.au  Fact Sheet for Healthcare Providers:  IncredibleEmployment.be  This test is no t yet approved or cleared by the [place] FDA and  has been authorized for detection and/or diagnosis of SARS-CoV-2 by FDA under an Emergency Use Authorization (EUA). This EUA will remain  in effect (meaning this test can be used) for the duration of the COVID-19 declaration under Section 564(b)(1) of the Act, 21 U.S.C.section 360bbb-3(b)(1), unless the authorization is terminated  or revoked sooner.       Influenza A by PCR NEGATIVE NEGATIVE Final   Influenza B by PCR NEGATIVE NEGATIVE Final    Comment: (NOTE) The Xpert Xpress SARS-CoV-2/FLU/RSV plus assay is intended as an aid in the diagnosis of influenza from Nasopharyngeal swab specimens and should not be used as a sole basis for treatment. Nasal washings and aspirates are unacceptable for Xpert Xpress SARS-CoV-2/FLU/RSV testing.  Fact Sheet for Patients: EntrepreneurPulse.com.au  Fact Sheet for Healthcare Providers: IncredibleEmployment.be  This test is not yet approved or cleared by the [place] FDA and has been authorized for detection and/or diagnosis of SARS-CoV-2 by FDA under an Emergency Use Authorization (EUA). This EUA will remain in effect (meaning this test can be used) for the duration of  the COVID-19 declaration under Section 564(b)(1) of the Act, 21 U.S.C. section 360bbb-3(b)(1), unless the authorization is terminated or revoked.  Performed at [facility], [place] [address] [place], [place] [address]      Labs: Basic Metabolic Panel: Recent Labs  Lab 04/06/21 0931 04/07/21 0615 04/08/21 0319 04/09/21 0508 04/10/21 0207  NA 140 138 137 139 135  K 4.4 3.9 4.3 4.9 5.0  CL 108 103 103 102 101  CO2 $Re'26 29 23 28 23  'smV$ GLUCOSE 84 112* 119* 168* 208*  BUN $Re'13 12 13 14 15  'StM$ CREATININE 1.07* 1.12* 1.17* 1.14* 1.02*  CALCIUM 8.7* 8.9 8.8* 9.0 9.0  PHOS 4.0  --   --   --   --    Liver Function Tests: Recent Labs  Lab 04/06/21 0931  ALBUMIN 2.5*   No results for input(s): LIPASE, AMYLASE in the last 168 hours. No results for input(s): AMMONIA in the last 168 hours. CBC: Recent Labs  Lab 04/06/21 0931 04/07/21 0615  WBC 5.0 5.7  NEUTROABS 2.7  --   HGB 11.2* 10.9*  HCT 37.9 36.1  MCV 91.8 90.3  PLT 145* 147*   Cardiac Enzymes: Recent Labs  Lab 04/09/21 0508  CKTOTAL 14*   BNP: BNP (last 3 results) Recent Labs    12/28/20 2331  BNP 577.5*    ProBNP (last 3 results) No results for input(s): PROBNP in the last 8760 hours.  CBG: Recent Labs  Lab 04/11/21 0628 04/11/21 1238 04/11/21 1736 04/11/21 1958 04/12/21 0636  GLUCAP 202* 141* 154* 179* 168*       Signed:  Florencia Reasons MD, PhD, FACP  [facility] 04/12/2021, 11:53 AM

## 2021-04-12 NOTE — Progress Notes (Signed)
Called [facility] and gave report to [place], [place]. Discussed discharge instructions and medication administration. RN already familiar with patient. Answered all questions. Notified physician about prescriptions as requested from [facility]. [place] N Gearl Kimbrough

## 2021-04-12 NOTE — Progress Notes (Signed)
[employer] to give report to RN. Transferred to another person and no answer. Will attempt to call again.  Tonya Baker

## 2021-04-12 NOTE — TOC Transition Note (Signed)
Transition of Care [facility]) - CM/SW Discharge Note   Patient Details  Name: Tonya Baker MRN: [id number] Date of Birth: 1950/09/23  [facility] CM/SW Contact:  Eduard Roux, LCSW Phone Number: 04/12/2021, 12:30 PM   Clinical Narrative:     Patient will Discharge to: [facility] Discharge Date: 04/12/2021 Family Notified: LVM w/ niece  Transport By: Sharin Mons  Per MD patient is ready for discharge. RN, patient, and facility notified of discharge. Discharge Summary sent to facility. RN given number for [phone]. Ambulance transport requested for patient.   Clinical Social Worker signing off.  Antony Blackbird, MSW, LCSW Clinical Social Worker     Final next level of care: Skilled Nursing Facility Barriers to Discharge: Barriers Resolved   Patient Goals and CMS Choice        Discharge Placement              Patient chooses bed at: [facility] Patient to be transferred to facility by: [facility] Name of family member notified: niece Patient and family notified of of transfer: 04/12/21  Discharge Plan and Services In-house Referral: Clinical Social Work                                   Social Determinants of Health (SDOH) Interventions     Readmission Risk Interventions No flowsheet data found.

## 2021-04-14 LAB — AEROBIC/ANAEROBIC CULTURE W GRAM STAIN (SURGICAL/DEEP WOUND): Culture: NO GROWTH

## 2021-04-21 ENCOUNTER — Inpatient Hospital Stay: Payer: Medicare Other | Admitting: Internal Medicine

## 2021-05-04 ENCOUNTER — Emergency Department: Payer: Self-pay

## 2021-05-04 ENCOUNTER — Emergency Department (HOSPITAL_COMMUNITY)
Admission: EM | Admit: 2021-05-04 | Discharge: 2021-05-04 | Disposition: A | Discharge status: Skilled Nursing Facility | Payer: Medicare Other | Attending: Emergency Medicine | Admitting: Emergency Medicine

## 2021-05-04 ENCOUNTER — Other Ambulatory Visit: Payer: Self-pay

## 2021-05-04 ENCOUNTER — Encounter (HOSPITAL_COMMUNITY): Payer: Self-pay

## 2021-05-04 DIAGNOSIS — Z452 Encounter for adjustment and management of vascular access device: Secondary | ICD-10-CM | POA: Insufficient documentation

## 2021-05-04 DIAGNOSIS — Y828 Other medical devices associated with adverse incidents: Secondary | ICD-10-CM | POA: Diagnosis not present

## 2021-05-04 DIAGNOSIS — T82594A Other mechanical complication of infusion catheter, initial encounter: Secondary | ICD-10-CM | POA: Insufficient documentation

## 2021-05-04 DIAGNOSIS — Z789 Other specified health status: Secondary | ICD-10-CM

## 2021-05-04 LAB — BASIC METABOLIC PANEL
Anion gap: 9 (ref 5–15)
BUN: 20 mg/dL (ref 8–23)
CO2: 28 mmol/L (ref 22–32)
Calcium: 9.2 mg/dL (ref 8.9–10.3)
Chloride: 102 mmol/L (ref 98–111)
Creatinine, Ser: 1.23 mg/dL — ABNORMAL HIGH (ref 0.44–1.00)
GFR, Estimated: 48 mL/min — ABNORMAL LOW (ref >60)
Glucose, Bld: 200 mg/dL — ABNORMAL HIGH (ref 70–99)
Potassium: 3.6 mmol/L (ref 3.5–5.1)
Sodium: 139 mmol/L (ref 135–145)

## 2021-05-04 LAB — CK: Total CK: 28 U/L — ABNORMAL LOW (ref 38–234)

## 2021-05-04 MED ORDER — CHLORHEXIDINE GLUCONATE CLOTH 2 % EX PADS
6.0000 | MEDICATED_PAD | Freq: Every day | CUTANEOUS | Status: DC
Start: 2021-05-04 — End: 2021-05-05
  Administered 2021-05-04: 6 via TOPICAL

## 2021-05-04 MED ORDER — SODIUM CHLORIDE 0.9% FLUSH: 10.0000 mL | INTRAVENOUS | Status: DC | PRN

## 2021-05-04 MED ORDER — ACETAMINOPHEN 500 MG PO TABS
500.0000 mg | ORAL_TABLET | Freq: Once | ORAL | Status: AC
  Administered 2021-05-04: 500 mg via ORAL
  Filled 2021-05-04: qty 1

## 2021-05-04 MED ORDER — SODIUM CHLORIDE 0.9 % IV SOLN
1.0000 g | INTRAVENOUS | Status: DC
  Administered 2021-05-04: 1000 mg via INTRAVENOUS
  Filled 2021-05-04: qty 1

## 2021-05-04 MED ORDER — SODIUM CHLORIDE 0.9 % IV SOLN
500.0000 mg | Freq: Every day | INTRAVENOUS | Status: DC
  Administered 2021-05-04: 500 mg via INTRAVENOUS
  Filled 2021-05-04: qty 10

## 2021-05-04 MED ORDER — SODIUM CHLORIDE 0.9% FLUSH
10.0000 mL | Freq: Two times a day (BID) | INTRAVENOUS | Status: DC
  Administered 2021-05-04: 10 mL

## 2021-05-04 NOTE — Progress Notes (Addendum)
Peripherally Inserted Central Catheter Placement  The IV Nurse has discussed with the patient and/or persons authorized to consent for the patient, the purpose of this procedure and the potential benefits and risks involved with this procedure.  The benefits include less needle sticks, lab draws from the catheter, and the patient may be discharged home with the catheter. Risks include, but not limited to, infection, bleeding, blood clot (thrombus formation), and puncture of an artery; nerve damage and irregular heartbeat and possibility to perform a PICC exchange if needed/ordered by physician.  Alternatives to this procedure were also discussed.  Bard Power PICC patient education guide, fact sheet on infection prevention and patient information card has been provided to patient /or left at bedside.    PICC Placement Documentation  PICC Single Lumen 05/04/21 Right Basilic 37 cm 0 cm (Active)  Indication for Insertion or Continuance of Line Prolonged intravenous therapies 05/04/21 1828  Exposed Catheter (cm) 0 cm 05/04/21 1828  Site Assessment Clean;Dry;Intact 05/04/21 1828  Line Status Flushed;Saline locked;Blood return noted 05/04/21 1828  Dressing Type Transparent;Securing device 05/04/21 1828  Dressing Status Clean;Dry;Intact 05/04/21 1828  Antimicrobial disc in place? Yes 05/04/21 1828  Safety Lock Not Applicable 05/04/21 1828  Line Adjustment (NICU/IV Team Only) No 05/04/21 1828  Dressing Intervention Seufert dressing;Other (Comment) 05/04/21 1828  Dressing Change Due 05/11/21 05/04/21 1828    Old RUA PICC out 9 cm, have rashes around the PICC site, both arms and legs.   Annett Fabian 05/04/2021, 6:31 PM

## 2021-05-04 NOTE — Discharge Instructions (Addendum)
Continue antibiotic as previously prescribed.  Follow-up with ID as previously planned.  Future Appointments  Date Time Provider Department Center  05/26/2021  2:00 PM Danelle Earthly, MD [facility]

## 2021-05-04 NOTE — ED Provider Notes (Signed)
[facility] EMERGENCY DEPARTMENT Provider Note   CSN: [id number] Arrival date & time: 05/04/21  1439     History  Chief Complaint  Patient presents with   Vascular Access Problem    Tonya Baker is a 71 y.o. female.  Presented to the emergency morning with concern for PICC line problem.  Patient reports that her line was not able to be flushed and she did not receive her IV antibiotics this morning.  Unsure of her last dose.  She otherwise feels fine today.  Has not noticed any rash or drainage from the insertion site.  No fevers.  Additional history was obtained from extensive review of chart, review of discharge summary, recent admission for abscess in  lumbar spine, followed closely by infectious disease and discharged to a nursing facility with prolonged course of IV antibiotics including ertapenem and daptomycin.  Patient will need antibiotics until 2/25.  Additionally reviewed ID notes.  Reviewed nursing facility notes at bedside, patient receiving ertapenem and daptomycin every 24 hours.  HPI     Home Medications Prior to Admission medications   Medication Sig Start Date End Date Taking? Authorizing Provider  acetaminophen (TYLENOL) 325 MG tablet Take 650 mg by mouth every 6 (six) hours as needed (pain).    [provider]  albuterol (PROVENTIL) (2.5 MG/3ML) 0.083% nebulizer solution Take 2.5 mg by nebulization every 8 (eight) hours as needed (congestion).    [provider]  aspirin 81 MG chewable tablet Chew 81 mg by mouth every morning.    [provider]  atorvastatin (LIPITOR) 40 MG tablet Take 40 mg by mouth at bedtime. 2100    [provider]  bisacodyl (DULCOLAX) 10 MG suppository Place 10 mg rectally daily as needed (constipation).    [provider]  cholecalciferol (VITAMIN D3) 25 MCG (1000 UNIT) tablet Take 1,000 Units by mouth every morning.    [provider]  cyclobenzaprine (FLEXERIL) 10 MG  tablet Take 10 mg by mouth 3 (three) times daily.    [provider]  daptomycin (CUBICIN) IVPB Inject 600 mg into the vein daily. Indication:  Vertebral infection with retained hardware First Dose: Yes Last Day of Therapy:  05/31/2021 Labs - Once weekly:  CBC/D, BMP, and CPK Labs - Every other week:  ESR and CRP Method of administration: IV Push Method of administration may be changed at the discretion of home infusion pharmacist based upon assessment of the patient and/or caregiver's ability to self-administer the medication ordered. 04/12/21 06/01/21  Florencia Reasons, MD  docusate sodium (COLACE) 100 MG capsule Take 100 mg by mouth every morning.    [provider]  doxycycline (VIBRAMYCIN) 100 MG capsule Take 100 mg by mouth every 12 (twelve) hours. 2100    [provider]  Dulaglutide (TRULICITY) 4.66 ZL/9.3TT SOPN Inject 0.75 mg into the skin every Monday.    [provider]  ertapenem (INVANZ) IVPB Inject 1 g into the vein daily. Indication:  Vertebral infection with retained hardware First Dose: Yes Last Day of Therapy:  05/31/2021 Labs - Once weekly:  CBC/D and BMP, Labs - Every other week:  ESR and CRP Method of administration: Mini-Bag Plus / Gravity Method of administration may be changed at the discretion of home infusion pharmacist based upon assessment of the patient and/or caregiver's ability to self-administer the medication ordered. 04/12/21 06/01/21  Florencia Reasons, MD  ferrous sulfate 325 (65 FE) MG tablet Take 1 tablet (325 mg total) by mouth every Monday,  Wednesday, and Friday. 04/14/21   Florencia Reasons, MD  furosemide (LASIX) 40 MG tablet Take 1 tablet (40 mg total) by mouth daily as needed for fluid or edema. 04/12/21 05/12/21  Florencia Reasons, MD  furosemide (LASIX) 40 MG tablet Take 1 tablet (40 mg total) by mouth every other day. 04/12/21   Florencia Reasons, MD  gabapentin (NEURONTIN) 300 MG capsule Take 600 mg by mouth 3 (three) times daily.    [provider]  insulin  glargine (LANTUS) 100 UNIT/ML injection Inject 0.3 mLs (30 Units total) into the skin daily. Takes Lantus between 0800- 0900. 04/12/21   Florencia Reasons, MD  insulin lispro (HUMALOG) 100 UNIT/ML injection Inject 2-10 Units into the skin 4 (four) times daily -  before meals and at bedtime. Sliding Scale:   CBG 151-200 2 units, 201-250 4 units, 251-300 6 units, 301-350 8 units, 351-400 10 units,    [provider]  magnesium citrate SOLN Take 30 mLs by mouth every 12 (twelve) hours as needed (constipation).    [provider]  metoCLOPramide (REGLAN) 5 MG tablet Take 5 mg by mouth 4 (four) times daily -  before meals and at bedtime.    [provider]  midodrine (PROAMATINE) 10 MG tablet Take 3 tablets (30 mg total) by mouth 3 (three) times daily. Patient taking differently: Take 20 mg by mouth 3 (three) times daily. 01/02/21   Barb Merino, MD  NOVOLOG FLEXPEN 100 UNIT/ML FlexPen Inject 2-10 Units into the skin See admin instructions. Inject as per sliding scale: 151-200 = 2   201-250 = 4   251-300 = 6   301-350 = 8   351-400 = 10. 03/10/21   [provider]  pantoprazole (PROTONIX) 40 MG tablet Take 40 mg by mouth 2 (two) times daily.    [provider]  polyethylene glycol (MIRALAX / GLYCOLAX) 17 g packet Take 17 g by mouth every morning.    [provider]  potassium chloride SA (KLOR-CON M) 20 MEQ tablet Take 1 tablet (20 mEq total) by mouth every other day. 04/12/21   Florencia Reasons, MD  QUEtiapine (SEROQUEL) 25 MG tablet Take 25 mg by mouth at bedtime. 2100    [provider]  saccharomyces boulardii (FLORASTOR) 250 MG capsule Take 1 capsule (250 mg total) by mouth 2 (two) times daily. 04/12/21 05/12/21  Florencia Reasons, MD  sertraline (ZOLOFT) 100 MG tablet Take 100 mg by mouth every morning.    [provider]  Skin Protectants, Misc. (EUCERIN) cream Apply 1 application topically daily.    [provider]  temazepam (RESTORIL) 30 MG capsule  Take 30 mg by mouth at bedtime.    [provider]      Allergies    Duloxetine hcl, Cortisone, Hydrocortisone, and Latex    Review of Systems   Review of Systems  All other systems reviewed and are negative.  Physical Exam Updated Vital Signs BP 113/63 (BP Location: Left Arm)    Pulse 80    Temp 98 F (36.7 C) (Oral)    Resp 17    Ht $R'5\' 4"'iI$  (1.626 m)    Wt 86.2 kg    SpO2 100%    BMI 32.62 kg/m  Physical Exam Vitals and nursing note reviewed.  Constitutional:      General: She is not in acute distress.    Appearance: She is well-developed.  HENT:     Head: Normocephalic and atraumatic.  Eyes:     Conjunctiva/sclera:  Conjunctivae normal.  Cardiovascular:     Rate and Rhythm: Normal rate and regular rhythm.     Heart sounds: No murmur heard. Pulmonary:     Effort: Pulmonary effort is normal. No respiratory distress.     Breath sounds: Normal breath sounds.  Abdominal:     Palpations: Abdomen is soft.     Tenderness: There is no abdominal tenderness.  Musculoskeletal:        General: No swelling.     Cervical back: Neck supple.  Skin:    General: Skin is warm and dry.     Capillary Refill: Capillary refill takes less than 2 seconds.     Comments: Right upper arm PICC line site evaluated, there is no surrounding erythema or drainage, the line appears to be partially withdrawn and kinked  Neurological:     Mental Status: She is alert.  Psychiatric:        Mood and Affect: Mood normal.    ED Results / Procedures / Treatments   Labs (all labs ordered are listed, but only abnormal results are displayed) Labs Reviewed  BASIC METABOLIC PANEL - Abnormal; Notable for the following components:      Result Value   Glucose, Bld 200 (*)    Creatinine, Ser 1.23 (*)    GFR, Estimated 48 (*)    All other components within normal limits  CK - Abnormal; Notable for the following components:   Total CK 28 (*)    All other components within normal limits     EKG None  Radiology [place] EKG SITE RITE  Result Date: 05/04/2021 If Site Rite image not attached, placement could not be confirmed due to current cardiac rhythm.   Procedures Procedures    Medications Ordered in ED Medications  ertapenem (INVANZ) 1,000 mg in sodium chloride 0.9 % 100 mL IVPB (has no administration in time range)  DAPTOmycin (CUBICIN) 500 mg in sodium chloride 0.9 % IVPB (has no administration in time range)  sodium chloride flush (NS) 0.9 % injection 10-40 mL (has no administration in time range)  sodium chloride flush (NS) 0.9 % injection 10-40 mL (has no administration in time range)  Chlorhexidine Gluconate Cloth 2 % PADS 6 each (has no administration in time range)  acetaminophen (TYLENOL) tablet 500 mg (500 mg Oral Given 05/04/21 1736)    ED Course/ Medical Decision Making/ A&P                           Medical Decision Making Amount and/or Complexity of Data Reviewed Labs: ordered.  Risk OTC drugs.   71 year old lady presented to ER with concern for PICC line problem.  On IV antibiotics through PICC line for prolonged course of daptomycin and ertapenem for lumbar spinal abscess.  Today patient has no acute medical complaint.  She appears well in no distress with normal vital signs and no fever.  Her PICC line appears to be accidentally withdrawn and kinked.  The IV access team evaluated line and recommends replacing.  Her line was replaced without any complications, I inspected the line And it appears well.  Discussed with pharmacy and will give her daily dose of daptomycin and ertapenem while awaiting transportation back to her facility.  Pharmacy requested basic labs including creatinine and CK checked.  These appear to be grossly stable.        Final Clinical Impression(s) / ED Diagnoses Final diagnoses:  Problem with vascular access  Rx / DC Orders ED Discharge Orders     None         Lucrezia Starch, MD 05/05/21 1251

## 2021-05-04 NOTE — ED Notes (Signed)
[facility] called  

## 2021-05-04 NOTE — ED Triage Notes (Signed)
Pt bib [facility] from [facility] where she was sent out for a picc line replacement. Pt states they tried to flush the picc line this am with no success. Pt presents AOx4, VSS. No complaints at this time

## 2021-05-26 ENCOUNTER — Inpatient Hospital Stay: Payer: Medicare Other | Admitting: Internal Medicine

## 2021-06-04 ENCOUNTER — Ambulatory Visit (INDEPENDENT_AMBULATORY_CARE_PROVIDER_SITE_OTHER): Payer: Medicare Other | Admitting: Internal Medicine

## 2021-06-04 ENCOUNTER — Encounter: Payer: Self-pay | Admitting: Internal Medicine

## 2021-06-04 ENCOUNTER — Other Ambulatory Visit: Payer: Self-pay

## 2021-06-04 DIAGNOSIS — M4620 Osteomyelitis of vertebra, site unspecified: Secondary | ICD-10-CM | POA: Diagnosis present

## 2021-06-04 NOTE — Assessment & Plan Note (Signed)
She completed her 8 week course of IV antibiotics and will have facility remove her PICC line.  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  Follow up in 6 months with Dr Candiss Norse.  ?

## 2021-06-04 NOTE — Patient Instructions (Signed)
--   continue doxycycline 100mg  twice daily indefinitely ?-- Follow up with [place] in 6 months ?-- Remove PICC line if not being used for other purposes.  ?

## 2021-06-04 NOTE — Progress Notes (Signed)
?  ? ? ?[facility] ? ?Reason for Consult: Back infection ? ?Referring Provider: Dr Thedore Mins ? ? ?HPI:   ? ?Tonya Baker is a 71 y.o. female with PMHx as below who presents to the clinic for spinal hardware infection.  ? ?Patient has a history of prior L3-L5 discitis status post L1-S1 decompression and fusion in March 2021 at the [facility].  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  She received 6 weeks of treatment antibiotics at that time and was advised to stay on doxycycline indefinitely for suppression.  She was admitted in September 2022 with strep dysgalactiae bacteremia.  This was treated with 2 weeks of penicillin/amoxicillin and she was continued on her suppressive doxycycline. ? ?Patient more recently underwent L5-S1 revision with fusion extension on 03/10/2021 with Dr. Johnsie Cancel.  She was admitted later that month with sepsis secondary to back abscess.  She underwent aspiration on 04/07/2021.  Gram stain from this had GNR's but no organisms grew in cultures.  She was followed by Dr. Thedore Mins during this admission who recommended ertapenem and daptomycin for 8 weeks followed by her suppressive doxycycline.  Patient was scheduled for follow-up with Dr. Thedore Mins on 04/21/2021, however, she did not keep this appointment.  She was then scheduled again on 2/20 however this appointment was canceled as well.  She presents today for follow-up. ? ?She reports doing well although has had a couple falls at rehab.  No back pain or fevers.  Still has a PICC line in place.   She finished antibiotics IV yesterday per her report.  ? ? ? ?Patient's Medications  ?[place] Prescriptions  ? No medications on file  ?Previous Medications  ? ACETAMINOPHEN (TYLENOL) 325 MG TABLET    Take 650 mg by mouth every 6 (six) hours as needed (pain).  ? ALBUTEROL (PROVENTIL) (2.5 MG/3ML) 0.083% NEBULIZER SOLUTION    Take 2.5 mg by nebulization every 8 (eight) hours as needed  (congestion).  ? ASPIRIN 81 MG CHEWABLE TABLET    Chew 81 mg by mouth every morning.  ? ATORVASTATIN (LIPITOR) 40 MG TABLET    Take 40 mg by mouth at bedtime. 2100  ? BISACODYL (DULCOLAX) 10 MG SUPPOSITORY    Place 10 mg rectally daily as needed (constipation).  ? CHOLECALCIFEROL (VITAMIN D3) 25 MCG (1000 UNIT) TABLET    Take 1,000 Units by mouth every morning.  ? CYCLOBENZAPRINE (FLEXERIL) 10 MG TABLET    Take 10 mg by mouth 3 (three) times daily.  ? DOCUSATE SODIUM (COLACE) 100 MG CAPSULE    Take 100 mg by mouth every morning.  ? DOXYCYCLINE (VIBRAMYCIN) 100 MG CAPSULE    Take 100 mg by mouth every 12 (twelve) hours. 2100  ? DULAGLUTIDE (TRULICITY) 0.75 MG/0.5ML SOPN    Inject 0.75 mg into the skin every Monday.  ? FERROUS SULFATE 325 (65 FE) MG TABLET    Take 1 tablet (325 mg total) by mouth every Monday, Wednesday, and Friday.  ? FUROSEMIDE (LASIX) 40 MG TABLET    Take 1 tablet (40 mg total) by mouth daily as needed for fluid or edema.  ? FUROSEMIDE (LASIX) 40 MG TABLET    Take 1 tablet (40 mg total) by mouth every other day.  ? GABAPENTIN (NEURONTIN) 300 MG CAPSULE    Take 600 mg by mouth 3 (three) times daily.  ? INSULIN GLARGINE (LANTUS) 100 UNIT/ML INJECTION    Inject 0.3 mLs (30 Units total) into the  skin daily. Takes Lantus between 0800- 0900.  ? INSULIN LISPRO (HUMALOG) 100 UNIT/ML INJECTION    Inject 2-10 Units into the skin 4 (four) times daily -  before meals and at bedtime. Sliding Scale: ?  CBG 151-200 2 units, 201-250 4 units, 251-300 6 units, 301-350 8 units, 351-400 10 units,  ? MAGNESIUM CITRATE SOLN    Take 30 mLs by mouth every 12 (twelve) hours as needed (constipation).  ? METOCLOPRAMIDE (REGLAN) 5 MG TABLET    Take 5 mg by mouth 4 (four) times daily -  before meals and at bedtime.  ? MIDODRINE (PROAMATINE) 10 MG TABLET    Take 3 tablets (30 mg total) by mouth 3 (three) times daily.  ? NOVOLOG FLEXPEN 100 UNIT/ML FLEXPEN    Inject 2-10 Units into the skin See admin instructions. Inject as per  sliding scale: 151-200 = 2  201-250 = 4  251-300 = 6  301-350 = 8  351-400 = 10.  ? PANTOPRAZOLE (PROTONIX) 40 MG TABLET    Take 40 mg by mouth 2 (two) times daily.  ? POLYETHYLENE GLYCOL (MIRALAX / GLYCOLAX) 17 G PACKET    Take 17 g by mouth every morning.  ? POTASSIUM CHLORIDE SA (KLOR-CON M) 20 MEQ TABLET    Take 1 tablet (20 mEq total) by mouth every other day.  ? QUETIAPINE (SEROQUEL) 25 MG TABLET    Take 25 mg by mouth at bedtime. 2100  ? SERTRALINE (ZOLOFT) 100 MG TABLET    Take 100 mg by mouth every morning.  ? SKIN PROTECTANTS, MISC. (EUCERIN) CREAM    Apply 1 application topically daily.  ? TEMAZEPAM (RESTORIL) 30 MG CAPSULE    Take 30 mg by mouth at bedtime.  ?Modified Medications  ? No medications on file  ?Discontinued Medications  ? No medications on file  ?   ? ?Past Medical History:  ?Diagnosis Date  ? Back pain   ? Obstructive sleep apnea   ? Type 2 diabetes mellitus with diabetic autonomic neuropathy, with long-term current use of insulin (HCC)   ? ? ?Social History  ? ?Tobacco Use  ? Smoking status: Former  ?  Types: Cigarettes  ?  Quit date: 7  ?  Years since quitting: 33.1  ? Smokeless tobacco: Never  ?Substance Use Topics  ? Alcohol use: Not Currently  ?  Comment: socially  ? Drug use: Never  ? ? ?No family history on file. ? ?Allergies  ?Allergen Reactions  ? Duloxetine Hcl Other (See Comments)  ?   ?tremors  ? Cortisone Other (See Comments)  ?  Unknown reaction  ? Hydrocortisone Other (See Comments)  ?  Unknown reaction  ? Latex Other (See Comments)  ?  Unknown reaction  ? ? ?Review of Systems  ?All other systems reviewed and are negative. ? ?Except as noted above.  ? ?OBJECTIVE:   ? ?Vitals:  ? 06/04/21 1414  ?BP: 117/79  ?Pulse: 82  ?Temp: 98.1 ?F (36.7 ?C)  ?TempSrc: Oral  ?SpO2: 98%  ?   ?There is no height or weight on file to calculate BMI. ? ?Physical Exam ?Constitutional:   ?   General: She is not in acute distress. ?   Appearance: Normal appearance.  ?   Comments: Elderly  woman sitting in wheelchir, no distres.   ?HENT:  ?   Head: Normocephalic and atraumatic.  ?Pulmonary:  ?   Effort: Pulmonary effort is normal. No respiratory distress.  ?Musculoskeletal:  ?   Comments:  RIght UE PICC in place.   ?Skin: ?   General: Skin is warm and dry.  ?Neurological:  ?   General: No focal deficit present.  ?   Mental Status: She is alert and oriented to person, place, and time.  ?Psychiatric:     ?   Mood and Affect: Mood normal.     ?   Behavior: Behavior normal.  ? ? ? ?Labs and Microbiology: ? ?CBC Latest Ref Rng & Units 04/07/2021 04/06/2021 04/05/2021  ?WBC 4.0 - 10.5 K/uL 5.7 5.0 4.3  ?Hemoglobin 12.0 - 15.0 g/dL 10.9(L) 11.2(L) 9.2(L)  ?Hematocrit 36.0 - 46.0 % 36.1 37.9 29.8(L)  ?Platelets 150 - 400 K/uL 147(L) 145(L) 110(L)  ? ?CMP Latest Ref Rng & Units 05/04/2021 04/10/2021 04/09/2021  ?Glucose 70 - 99 mg/dL 267(T) 245(Y) 099(I)  ?BUN 8 - 23 mg/dL 20 15 14   ?Creatinine 0.44 - 1.00 mg/dL ) 3.38(S) 5.05(L)  ?Sodium 135 - 145 mmol/L 139 135 139  ?Potassium 3.5 - 5.1 mmol/L 3.6 5.0 4.9  ?Chloride 98 - 111 mmol/L 102 101 102  ?CO2 22 - 32 mmol/L 28 23 28   ?Calcium 8.9 - 10.3 mg/dL 9.2 9.0 9.0  ?Total Protein 6.5 - 8.1 g/dL - - -  ?Total Bilirubin 0.3 - 1.2 mg/dL - - -  ?Alkaline Phos 38 - 126 U/L - - -  ?AST 15 - 41 U/L - - -  ?ALT 0 - 44 U/L - - -  ?  ? ? ? ?ASSESSMENT & PLAN:   ? ?Vertebral osteomyelitis (HCC) ?She completed her 8 week course of IV antibiotics and will have facility remove her PICC line.  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  Follow up in 6 months with Dr 9.76(B.  ? ? ? ? ?[facility] ?[facility] ?06/04/2021, 2:30 PM ? ? ?I spent 40 minutes dedicated to the care of this patient on the date of this encounter to include pre-visit review of records, face-to-face time with the patient discussing spine infection, MRSA, and post-visit ordering of  testing.  ? ?

## 2021-10-29 LAB — COMPREHENSIVE METABOLIC PANEL: eGFR: 50

## 2021-10-29 LAB — LIPID PANEL
LDL Cholesterol: 114
Triglycerides: 182 — AB (ref 40–160)

## 2021-10-29 LAB — HEPATIC FUNCTION PANEL: Alkaline Phosphatase: 161 — AB (ref 25–125)

## 2021-10-29 LAB — TSH: TSH: 2.03 (ref 0.41–5.90)

## 2021-10-29 LAB — BASIC METABOLIC PANEL
BUN: 26 — AB (ref 4–21)
Creatinine: 1.2 — AB (ref 0.5–1.1)
Glucose: 138

## 2021-10-29 LAB — HEMOGLOBIN A1C: Hemoglobin A1C: 9

## 2021-12-01 ENCOUNTER — Other Ambulatory Visit: Payer: Self-pay | Admitting: *Deleted

## 2021-12-01 DIAGNOSIS — L905 Scar conditions and fibrosis of skin: Secondary | ICD-10-CM

## 2021-12-02 ENCOUNTER — Encounter: Payer: Self-pay | Admitting: General Surgery

## 2021-12-02 ENCOUNTER — Ambulatory Visit (INDEPENDENT_AMBULATORY_CARE_PROVIDER_SITE_OTHER): Payer: Medicare Other | Admitting: General Surgery

## 2021-12-02 VITALS — BP 126/76 | HR 66 | Temp 98.0°F | Resp 14 | Ht 64.0 in | Wt 224.0 lb

## 2021-12-02 DIAGNOSIS — R222 Localized swelling, mass and lump, trunk: Secondary | ICD-10-CM | POA: Diagnosis not present

## 2021-12-02 NOTE — Progress Notes (Signed)
Tonya Baker; [id number]; 01-12-1951   HPI Patient is a 71 year old white female who was referred to my care by Dr. Clelia Croft for evaluation treatment of a tender nodule along a lower abdominal incision.  She has had multiple issues with her back requiring both the posterior and anterior approach.  Her course has been complicated by wound dehiscence and infections from her back.  She is currently using a wheelchair as it is difficult for her to ambulate.  She states that she is felt not in the anterior incision that was used for her back surgery for many months.  It occasionally seems to swell and get tender.  She is currently on antibiotics for her osteomyelitis and hardware in her back.  She denies any drainage from this wound. Past Medical History:  Diagnosis Date   Back pain    Obstructive sleep apnea    Type 2 diabetes mellitus with diabetic autonomic neuropathy, with long-term current use of insulin (HCC)     Past Surgical History:  Procedure Laterality Date   APPLICATION OF ROBOTIC ASSISTANCE FOR SPINAL PROCEDURE N/A 03/10/2021   Procedure: APPLICATION OF ROBOTIC ASSISTANCE FOR SPINAL PROCEDURE;  Surgeon: [name], MD;  Location: [facility] OR;  Service: Neurosurgery;  Laterality: N/A;   BACK SURGERY N/A 06/2020   BREAST SURGERY      History reviewed. No pertinent family history.  Current Outpatient Medications on File Prior to Visit  Medication Sig Dispense Refill   acetaminophen (TYLENOL) 325 MG tablet Take 650 mg by mouth every 6 (six) hours as needed (pain).     albuterol (PROVENTIL) (2.5 MG/3ML) 0.083% nebulizer solution Take 2.5 mg by nebulization every 8 (eight) hours as needed (congestion).     aspirin 81 MG chewable tablet Chew 81 mg by mouth every morning.     atorvastatin (LIPITOR) 40 MG tablet Take 40 mg by mouth at bedtime. 2100     bisacodyl (DULCOLAX) 10 MG suppository Place 10 mg rectally daily as needed (constipation).     cholecalciferol (VITAMIN D3) 25 MCG (1000 UNIT)  tablet Take 1,000 Units by mouth every morning.     cyclobenzaprine (FLEXERIL) 10 MG tablet Take 10 mg by mouth 3 (three) times daily.     docusate sodium (COLACE) 100 MG capsule Take 100 mg by mouth every morning.     doxycycline (VIBRAMYCIN) 100 MG capsule Take 100 mg by mouth every 12 (twelve) hours. 2100     ferrous sulfate 325 (65 FE) MG tablet Take 1 tablet (325 mg total) by mouth every Monday, Wednesday, and Friday.  3   furosemide (LASIX) 40 MG tablet Take 1 tablet (40 mg total) by mouth every other day. 30 tablet    gabapentin (NEURONTIN) 300 MG capsule Take 600 mg by mouth 3 (three) times daily.     insulin aspart (NOVOLOG) 100 UNIT/ML injection Inject 100 Units into the skin 3 (three) times daily before meals.     Insulin Glargine (BASAGLAR KWIKPEN) 100 UNIT/ML Inject 60 Units into the skin 2 (two) times daily. 60 q am and 30 qpm     magnesium citrate SOLN Take 30 mLs by mouth every 12 (twelve) hours as needed (constipation).     metoCLOPramide (REGLAN) 5 MG tablet Take 5 mg by mouth 4 (four) times daily -  before meals and at bedtime.     midodrine (PROAMATINE) 10 MG tablet Take 3 tablets (30 mg total) by mouth 3 (three) times daily. (Patient taking differently: Take 20 mg by mouth 3 (  three) times daily.)     NOVOLOG FLEXPEN 100 UNIT/ML FlexPen Inject 2-10 Units into the skin See admin instructions. Inject as per sliding scale: 151-200 = 2  201-250 = 4  251-300 = 6  301-350 = 8  351-400 = 10.     oxyCODONE (OXYCONTIN) 10 mg 12 hr tablet Take 10 mg by mouth 2 (two) times daily as needed.     pantoprazole (PROTONIX) 40 MG tablet Take 40 mg by mouth 2 (two) times daily.     polyethylene glycol (MIRALAX / GLYCOLAX) 17 g packet Take 17 g by mouth every morning.     potassium chloride SA (KLOR-CON M) 20 MEQ tablet Take 1 tablet (20 mEq total) by mouth every other day.     QUEtiapine (SEROQUEL) 25 MG tablet Take 25 mg by mouth at bedtime. 2100     saccharomyces boulardii (FLORASTOR) 250 MG  capsule Take 250 mg by mouth 2 (two) times daily.     Semaglutide,0.25 or 0.5MG /DOS, (OZEMPIC, 0.25 OR 0.5 MG/DOSE,) 2 MG/3ML SOPN Inject into the skin.     sertraline (ZOLOFT) 100 MG tablet Take 100 mg by mouth every morning.     Skin Protectants, Misc. (EUCERIN) cream Apply 1 application topically daily.     temazepam (RESTORIL) 30 MG capsule Take 30 mg by mouth at bedtime.     furosemide (LASIX) 40 MG tablet Take 1 tablet (40 mg total) by mouth daily as needed for fluid or edema. 30 tablet 0   No current facility-administered medications on file prior to visit.    Allergies  Allergen Reactions   Duloxetine Hcl Other (See Comments)     tremors   Cortisone Other (See Comments)    Unknown reaction   Hydrocortisone Other (See Comments)    Unknown reaction   Latex Other (See Comments)    Unknown reaction    Social History   Substance and Sexual Activity  Alcohol Use Not Currently   Comment: socially    Social History   Tobacco Use  Smoking Status Former   Types: Cigarettes   Quit date: 1990   Years since quitting: 33.6  Smokeless Tobacco Never    Review of Systems  Constitutional: Negative.   HENT:  Positive for sinus pain.   Eyes:  Positive for blurred vision.  Respiratory:  Positive for shortness of breath.   Cardiovascular: Negative.   Gastrointestinal:  Positive for abdominal pain.  Genitourinary: Negative.   Musculoskeletal:  Positive for back pain, joint pain and neck pain.  Skin: Negative.   Neurological:  Positive for sensory change.  Endo/Heme/Allergies: Negative.   Psychiatric/Behavioral: Negative.      Objective   Vitals:   12/02/21 1302  BP: 126/76  Pulse: 66  Resp: 14  Temp: 98 F (36.7 C)  SpO2: 96%    Physical Exam Vitals reviewed.  Constitutional:      Appearance: Normal appearance. She is obese. She is not ill-appearing.     Comments: Patient in a wheelchair.  HENT:     Head: Normocephalic and atraumatic.  Cardiovascular:      Rate and Rhythm: Normal rate and regular rhythm.     Heart sounds: Normal heart sounds. No murmur heard.    No friction rub. No gallop.  Pulmonary:     Effort: Pulmonary effort is normal. No respiratory distress.     Breath sounds: Normal breath sounds. No stridor. No wheezing, rhonchi or rales.  Abdominal:     General: There is no distension.  Palpations: Abdomen is soft. There is no mass.     Tenderness: There is no abdominal tenderness. There is no guarding or rebound.     Hernia: No hernia is present.     Comments: A left lower longitudinal surgical scars present.  1.5 cm subcutaneous mobile nodules present without fluctuance or induration along the portion of the incision.  The incision is well-healed without drainage or erythema.  Skin:    General: Skin is warm and dry.  Neurological:     Mental Status: She is alert and oriented to person, place, and time.     Assessment  Probable granuloma along surgical incision scar.  No evidence of infection. Plan  Given its location and lack of signs or symptoms of infection, I told the patient that this was probably a granuloma beneath the surgical scar.  As it does not affect her ADL, I do not recommend surgical excision at the present time given her multiple comorbidities and difficulty healing.  She understands and agrees.  I told her to return to my care should there be any changes.  Follow-up here as needed.

## 2021-12-09 ENCOUNTER — Telehealth: Payer: Self-pay

## 2021-12-09 ENCOUNTER — Ambulatory Visit: Payer: Medicare Other | Admitting: Internal Medicine

## 2021-12-09 NOTE — Telephone Encounter (Signed)
Called patient to see if she would be able to make it to today's appointment, no answer. Left HIPAA compliant voicemail requesting callback.   Tannia Contino D Ollin Hochmuth, RN  

## 2022-04-27 ENCOUNTER — Encounter: Payer: Self-pay | Admitting: Nurse Practitioner

## 2022-04-27 ENCOUNTER — Telehealth: Payer: Self-pay | Admitting: *Deleted

## 2022-04-27 ENCOUNTER — Ambulatory Visit (INDEPENDENT_AMBULATORY_CARE_PROVIDER_SITE_OTHER): Payer: Medicare Other | Admitting: Nurse Practitioner

## 2022-04-27 VITALS — BP 132/85 | HR 70 | Ht 64.0 in | Wt 235.0 lb

## 2022-04-27 DIAGNOSIS — N1831 Chronic kidney disease, stage 3a: Secondary | ICD-10-CM

## 2022-04-27 DIAGNOSIS — Z794 Long term (current) use of insulin: Secondary | ICD-10-CM | POA: Diagnosis not present

## 2022-04-27 DIAGNOSIS — E1122 Type 2 diabetes mellitus with diabetic chronic kidney disease: Secondary | ICD-10-CM

## 2022-04-27 LAB — POCT GLYCOSYLATED HEMOGLOBIN (HGB A1C): Hemoglobin A1C: 7.7 % — AB (ref 4.0–5.6)

## 2022-04-27 MED ORDER — DEXCOM G7 SENSOR MISC: 1.0000 | 3 refills | Status: AC

## 2022-04-27 MED ORDER — NOVOLOG FLEXPEN 100 UNIT/ML ~~LOC~~ SOPN: 4.0000 [IU] | PEN_INJECTOR | Freq: Three times a day (TID) | SUBCUTANEOUS | 3 refills | Status: DC

## 2022-04-27 MED ORDER — NOVOLOG FLEXPEN 100 UNIT/ML ~~LOC~~ SOPN: 4.0000 [IU] | PEN_INJECTOR | SUBCUTANEOUS | 3 refills | Status: DC

## 2022-04-27 MED ORDER — BASAGLAR KWIKPEN 100 UNIT/ML ~~LOC~~ SOPN: PEN_INJECTOR | SUBCUTANEOUS | 3 refills | Status: DC

## 2022-04-27 MED ORDER — DEXCOM G7 RECEIVER DEVI: 1.0000 | Freq: Once | 0 refills | Status: AC

## 2022-04-27 MED ORDER — SEMAGLUTIDE (1 MG/DOSE) 4 MG/3ML ~~LOC~~ SOPN: 1.0000 mg | PEN_INJECTOR | SUBCUTANEOUS | 3 refills | Status: DC

## 2022-04-27 NOTE — Telephone Encounter (Signed)
I fixed it.  Resent the script with the change

## 2022-04-27 NOTE — Telephone Encounter (Signed)
[facility] in [place] left a message, asking for a call back , fax or e-scribe for the patient's prescription for the medication Novolog. It is needing administration frequency.

## 2022-04-27 NOTE — Progress Notes (Signed)
Endocrinology Consult Note       04/28/2022, 7:12 AM   Subjective:    Patient ID: Tonya Baker, female    DOB: 1950-06-05.  Tonya Baker is being seen in consultation for management of currently uncontrolled symptomatic diabetes requested by  Denton Meek, FNP.   Past Medical History:  Diagnosis Date   Back pain    Obstructive sleep apnea    Type 2 diabetes mellitus with diabetic autonomic neuropathy, with long-term current use of insulin (HCC)     Past Surgical History:  Procedure Laterality Date   APPLICATION OF ROBOTIC ASSISTANCE FOR SPINAL PROCEDURE N/A 03/10/2021   Procedure: APPLICATION OF ROBOTIC ASSISTANCE FOR SPINAL PROCEDURE;  Surgeon: [name], MD;  Location: [facility] OR;  Service: Neurosurgery;  Laterality: N/A;   BACK SURGERY N/A 06/2020   BREAST SURGERY      Social History   Socioeconomic History   Marital status: Single    Spouse name: Not on file   Number of children: Not on file   Years of education: Not on file   Highest education level: Not on file  Occupational History   Not on file  Tobacco Use   Smoking status: Former    Types: Cigarettes    Quit date: 22    Years since quitting: 34.0   Smokeless tobacco: Never  Substance and Sexual Activity   Alcohol use: Not Currently    Comment: socially   Drug use: Never   Sexual activity: Not Currently  Other Topics Concern   Not on file  Social History Narrative   Not on file   Social Determinants of Health   Financial Resource Strain: Not on file  Food Insecurity: Not on file  Transportation Needs: Not on file  Physical Activity: Not on file  Stress: Not on file  Social Connections: Not on file    History reviewed. No pertinent family history.  Outpatient Encounter Medications as of 04/27/2022  Medication Sig   acetaminophen (TYLENOL) 325 MG tablet Take 650 mg by mouth every 6 (six) hours as needed (pain).    albuterol (PROVENTIL) (2.5 MG/3ML) 0.083% nebulizer solution Take 2.5 mg by nebulization every 8 (eight) hours as needed (congestion).   aspirin 81 MG chewable tablet Chew 81 mg by mouth every morning.   atorvastatin (LIPITOR) 40 MG tablet Take 40 mg by mouth at bedtime. 2100   bisacodyl (DULCOLAX) 10 MG suppository Place 10 mg rectally daily as needed (constipation).   cholecalciferol (VITAMIN D3) 25 MCG (1000 UNIT) tablet Take 1,000 Units by mouth every morning.   [EXPIRED] Continuous Blood Gluc Receiver (DEXCOM G7 RECEIVER) DEVI 1 Device by Does not apply route once for 1 dose.   Continuous Blood Gluc Sensor (DEXCOM G7 SENSOR) MISC Inject 1 Application into the skin as directed. Change sensor every 10 days as directed.   cyclobenzaprine (FLEXERIL) 10 MG tablet Take 10 mg by mouth 3 (three) times daily.   docusate sodium (COLACE) 100 MG capsule Take 100 mg by mouth every morning.   doxycycline (VIBRAMYCIN) 100 MG capsule Take 100 mg by mouth every 12 (twelve) hours. 2100   ferrous sulfate 325 (65 FE) MG tablet Take 1 tablet (  325 mg total) by mouth every Monday, Wednesday, and Friday.   furosemide (LASIX) 40 MG tablet Take 1 tablet (40 mg total) by mouth every other day.   gabapentin (NEURONTIN) 300 MG capsule Take 600 mg by mouth 3 (three) times daily.   magnesium citrate SOLN Take 30 mLs by mouth every 12 (twelve) hours as needed (constipation).   metoCLOPramide (REGLAN) 5 MG tablet Take 5 mg by mouth 4 (four) times daily -  before meals and at bedtime.   midodrine (PROAMATINE) 10 MG tablet Take 3 tablets (30 mg total) by mouth 3 (three) times daily. (Patient taking differently: Take 20 mg by mouth 3 (three) times daily.)   oxyCODONE (OXYCONTIN) 10 mg 12 hr tablet Take 10 mg by mouth 2 (two) times daily as needed.   pantoprazole (PROTONIX) 40 MG tablet Take 40 mg by mouth 2 (two) times daily.   polyethylene glycol (MIRALAX / GLYCOLAX) 17 g packet Take 17 g by mouth every morning.   potassium  chloride SA (KLOR-CON M) 20 MEQ tablet Take 1 tablet (20 mEq total) by mouth every other day.   QUEtiapine (SEROQUEL) 25 MG tablet Take 25 mg by mouth at bedtime. 2100   saccharomyces boulardii (FLORASTOR) 250 MG capsule Take 250 mg by mouth 2 (two) times daily.   Semaglutide, 1 MG/DOSE, 4 MG/3ML SOPN Inject 1 mg as directed once a week.   sertraline (ZOLOFT) 100 MG tablet Take 100 mg by mouth every morning.   Skin Protectants, Misc. (EUCERIN) cream Apply 1 application topically daily.   temazepam (RESTORIL) 30 MG capsule Take 30 mg by mouth at bedtime.   [DISCONTINUED] insulin aspart (NOVOLOG) 100 UNIT/ML injection Inject 100 Units into the skin 3 (three) times daily before meals.   [DISCONTINUED] Insulin Glargine (BASAGLAR KWIKPEN) 100 UNIT/ML Inject 60 Units into the skin 2 (two) times daily. 50 units with breakfast and 20 units at bedtime   [DISCONTINUED] NOVOLOG FLEXPEN 100 UNIT/ML FlexPen Inject 4-10 Units into the skin See admin instructions. 4 units if glucose 90-150 and eating. 151-200= 5 units; 201-250= 6 units; 251-300=7 units; 301-350= 8 units; 351-400= 9 units; above 400 = 10 units   [DISCONTINUED] Semaglutide,0.25 or 0.5MG /DOS, (OZEMPIC, 0.25 OR 0.5 MG/DOSE,) 2 MG/3ML SOPN Inject into the skin.   furosemide (LASIX) 40 MG tablet Take 1 tablet (40 mg total) by mouth daily as needed for fluid or edema.   Insulin Glargine (BASAGLAR KWIKPEN) 100 UNIT/ML 50 units with breakfast and 20 units at bedtime   [DISCONTINUED] NOVOLOG FLEXPEN 100 UNIT/ML FlexPen Inject 4-10 Units into the skin See admin instructions. 4 units if glucose 90-150 and eating. 151-200= 5 units; 201-250= 6 units; 251-300=7 units; 301-350= 8 units; 351-400= 9 units; above 400 = 10 units   No facility-administered encounter medications on file as of 04/27/2022.    ALLERGIES: Allergies  Allergen Reactions   Duloxetine Hcl Other (See Comments)     tremors   Cortisone Other (See Comments)    Unknown reaction    Hydrocortisone Other (See Comments)    Unknown reaction   Latex Other (See Comments)    Unknown reaction    VACCINATION STATUS: Immunization History  Administered Date(s) Administered   Fluad Quad(high Dose 65+) 03/13/2021   Pneumococcal Polysaccharide-23 03/13/2021   Tdap 01/23/2021    Diabetes She presents for her initial diabetic visit. She has type 2 diabetes mellitus. Onset time: diagnosed at approx age of 72. Her disease course has been fluctuating. Hypoglycemia symptoms include nervousness/anxiousness, sweats and tremors.  Pertinent negatives for diabetes include no weight loss. There are no hypoglycemic complications. Diabetic complications include heart disease, nephropathy, peripheral neuropathy and PVD. Risk factors for coronary artery disease include diabetes mellitus, dyslipidemia, family history, obesity, sedentary lifestyle, hypertension and post-menopausal. Current diabetic treatment includes intensive insulin program (and ozempic). She is compliant with treatment most of the time. Her weight is increasing steadily. She is following a generally unhealthy diet. Meal planning includes ADA exchanges. She has not had a previous visit with a dietitian. She rarely participates in exercise. Her home blood glucose trend is fluctuating dramatically. (She presents today for her consultation from [facility], alone, with no glucose logs to review.  Her POCT A1c today is 7.7%, improving from last A1c of 9%.  Currently she has been taking Basaglar 60 units in the morning and 30 units at night, Novolog 4-12 units TID with meals, and Ozempic 0.5 mg SQ weekly.  She is concerned that she has gained a substantial amount of weight recently.  The ALF monitors glucose 4 times daily and she reports it fluctuates quite dramatically.  She drinks diet soda, coffee with sweetener, water, and eats 3 meals a day and occasionally a snack (when glucose is low).  She does WC exercises, is limited with  her physical activity due to severe neuropathy and Charcot's ankle.  She has an eye exam coming up soon and does have regular podiatry care at the ALF.) An ACE inhibitor/angiotensin II receptor blocker is not being taken. She sees a podiatrist.Eye exam is current.     Review of systems  Constitutional: + steadily increasing body weight, current Body mass index is 40.34 kg/m., no fatigue, no subjective hyperthermia, no subjective hypothermia Eyes: no blurry vision, no xerophthalmia ENT: no sore throat, no nodules palpated in throat, no dysphagia/odynophagia, no hoarseness Cardiovascular: no chest pain, no shortness of breath, no palpitations, no leg swelling Respiratory: no cough, no shortness of breath Gastrointestinal: no nausea/vomiting/diarrhea Musculoskeletal: no muscle/joint aches Skin: no rashes, no hyperemia Neurological: no tremors, no numbness, no tingling, no dizziness Psychiatric: no depression, no anxiety  Objective:     BP 132/85 (BP Location: Left Arm, Patient Position: Sitting, Cuff Size: Large)   Pulse 70   Ht 5\' 4"  (1.626 m)   Wt 235 lb (106.6 kg) Comment: Per patient  BMI 40.34 kg/m   Wt Readings from Last 3 Encounters:  04/27/22 235 lb (106.6 kg)  12/02/21 224 lb (101.6 kg)  05/04/21 190 lb 0.6 oz (86.2 kg)     BP Readings from Last 3 Encounters:  04/27/22 132/85  12/02/21 126/76  06/04/21 117/79     Physical Exam- Limited  Constitutional:  Body mass index is 40.34 kg/m. , not in acute distress, normal state of mind Eyes:  EOMI, no exophthalmos Neck: Supple Cardiovascular: RRR, no murmurs, rubs, or gallops, no edema Respiratory: Adequate breathing efforts, no crackles, rales, rhonchi, or wheezing Musculoskeletal: no gross deformities, strength intact in all four extremities, no gross restriction of joint movements Skin:  no rashes, no hyperemia Neurological: no tremor with outstretched hands    CMP ( most recent) CMP     Component Value  Date/Time   NA 139 05/04/2021 1729   K 3.6 05/04/2021 1729   CL 102 05/04/2021 1729   CO2 28 05/04/2021 1729   GLUCOSE 200 (H) 05/04/2021 1729   BUN 26 (A) 10/29/2021 0000   CREATININE 1.2 (A) 10/29/2021 0000   CREATININE 1.23 (H) 05/04/2021 1729   CALCIUM 9.2 05/04/2021  1729   PROT 6.9 04/04/2021 1040   ALBUMIN 2.5 (L) 04/06/2021 0931   AST 21 04/04/2021 1040   ALT 11 04/04/2021 1040   ALKPHOS 161 (A) 10/29/2021 0000   BILITOT 0.7 04/04/2021 1040   GFRNONAA 48 (L) 05/04/2021 1729     Diabetic Labs (most recent): Lab Results  Component Value Date   HGBA1C 7.7 (A) 04/27/2022   HGBA1C 9 10/29/2021   HGBA1C 6.6 (H) 03/10/2021     Lipid Panel ( most recent) Lipid Panel     Component Value Date/Time   TRIG 182 (A) 10/29/2021 0000   LDLCALC 114 10/29/2021 0000      Lab Results  Component Value Date   TSH 2.03 10/29/2021   TSH 0.884 04/04/2021           Assessment & Plan:   1) Type 2 diabetes mellitus with stage 3a chronic kidney disease, with long-term current use of insulin (HCC)  She presents today for her consultation from [facility], alone, with no glucose logs to review.  Her POCT A1c today is 7.7%, improving from last A1c of 9%.  Currently she has been taking Basaglar 60 units in the morning and 30 units at night, Novolog 4-12 units TID with meals, and Ozempic 0.5 mg SQ weekly.  She is concerned that she has gained a substantial amount of weight recently.  The ALF monitors glucose 4 times daily and she reports it fluctuates quite dramatically.  She drinks diet soda, coffee with sweetener, water, and eats 3 meals a day and occasionally a snack (when glucose is low).  She does WC exercises, is limited with her physical activity due to severe neuropathy and Charcot's ankle.  She has an eye exam coming up soon and does have regular podiatry care at the ALF.  - Tonya Baker has currently uncontrolled symptomatic type 2 DM since 72 years of age, with most  recent A1c of 7.7 %.   -Recent labs reviewed.  - I had a long discussion with her about the progressive nature of diabetes and the pathology behind its complications. -her diabetes is complicated by CKD, PAD, CAD, gastroparesis, neuropathy and she remains at a high risk for more acute and chronic complications which include CAD, CVA, CKD, retinopathy, and neuropathy. These are all discussed in detail with her.  The following Lifestyle Medicine recommendations according to American College of Lifestyle Medicine [facility]) were discussed and offered to patient and she agrees to start the journey:  A. Whole Foods, Plant-based plate comprising of fruits and vegetables, plant-based proteins, whole-grain carbohydrates was discussed in detail with the patient.   A list for source of those nutrients were also provided to the patient.  Patient will use only water or unsweetened tea for hydration. B.  The need to stay away from risky substances including alcohol, smoking; obtaining 7 to 9 hours of restorative sleep, at least 150 minutes of moderate intensity exercise weekly, the importance of healthy social connections,  and stress reduction techniques were discussed. C.  A full color page of  Calorie density of various food groups per pound showing examples of each food groups was provided to the patient.  - I have counseled her on diet and weight management by adopting a carbohydrate restricted/protein rich diet. Patient is encouraged to switch to unprocessed or minimally processed complex starch and increased protein intake (animal or plant source), fruits, and vegetables. -  she is advised to stick to a routine mealtimes to eat 3 meals  a day and avoid unnecessary snacks (to snack only to correct hypoglycemia).   - she acknowledges that there is a room for improvement in her food and drink choices. - Suggestion is made for her to avoid simple carbohydrates from her diet including Cakes, Sweet Desserts, Ice  Cream, Soda (diet and regular), Sweet Tea, Candies, Chips, Cookies, Store Bought Juices, Alcohol in Excess of 1-2 drinks a day, Artificial Sweeteners, Coffee Creamer, and "Sugar-free" Products. This will help patient to have more stable blood glucose profile and potentially avoid unintended weight gain.  - I have approached her with the following individualized plan to manage her diabetes and patient agrees:   -I discussed changing her Basaglar to evening administration only but she is very hesitant, says that her glucose was extremely high during the day when she doesn't take a shot in the morning.  I suspect she may have overlap in her long-acting insulin coverage which is contributing to significant drops as well, leading to overcorrection and subsequent weight gain.  Instead, I suggested lowering both her morning and evening doses.  She is advised to adjust her Basaglar to 50 units in the morning and 20 units SQ nightly.  I did adjust her Novolog SSI slightly to 4-10 units TID with meals if glucose is above 90 and she is eating (Specific instructions on how to titrate insulin dosage based on glucose readings given to patient in writing).  I also increased her Ozempic today to 1 mg SQ weekly.  -she is encouraged to continue monitoring glucose 4 times daily, before meals and before bed, to log their readings on the clinic sheets provided, and bring them to review at follow up appointment in 4 weeks.  She could benefit from CGM device.  I did send order in but I am not sure what the ALF policy would be on using one rather than fingersticks.  - she is warned not to take insulin without proper monitoring per orders. - Adjustment parameters are given to her for hypo and hyperglycemia in writing. - she is encouraged to call clinic for blood glucose levels less than 70 or above 300 mg /dl.  -She did not tolerate Metformin in the past.  - Specific targets for  A1c; LDL, HDL, and Triglycerides were  discussed with the patient.  2) Blood Pressure /Hypertension:  her blood pressure is controlled to target.   she is advised to continue her current medications including Lasix 40 mg po every other day as needed for fluid.  She is also on Midodrine 30 mg po TID.  3) Lipids/Hyperlipidemia:    Review of her recent lipid panel from 10/29/21 showed uncontrolled LDL at 114 and elevated triglycerides of 182 .  she is advised to continue Lipitor 40 mg daily at bedtime.  Side effects and precautions discussed with her.  4)  Weight/Diet:  her Body mass index is 40.34 kg/m.  -  clearly complicating her diabetes care.   she is a candidate for weight loss. I discussed with her the fact that loss of 5 - 10% of her  current body weight will have the most impact on her diabetes management.  Exercise, and detailed carbohydrates information provided  -  detailed on discharge instructions.  5) Chronic Care/Health Maintenance: -she is not on ACEI/ARB and is on Statin medications and is encouraged to initiate and continue to follow up with Ophthalmology, Dentist, Podiatrist at least yearly or according to recommendations, and advised to stay away from smoking. I have  recommended yearly flu vaccine and pneumonia vaccine at least every 5 years; moderate intensity exercise for up to 150 minutes weekly; and sleep for at least 7 hours a day.  - she is advised to maintain close follow up with Denton Meek, FNP for primary care needs, as well as her other providers for optimal and coordinated care.   - Time spent in this patient care: 60 min, of which > 50% was spent in counseling her about her diabetes and the rest reviewing her blood glucose logs, discussing her hypoglycemia and hyperglycemia episodes, reviewing her current and previous labs/studies (including abstraction from other facilities) and medications doses and developing a long term treatment plan based on the latest standards of care/guidelines; and  documenting her care.    Please refer to Patient Instructions for Blood Glucose Monitoring and Insulin/Medications Dosing Guide" in media tab for additional information. Please also refer to "Patient Self Inventory" in the Media tab for reviewed elements of pertinent patient history.  Tonya Baker participated in the discussions, expressed understanding, and voiced agreement with the above plans.  All questions were answered to her satisfaction. she is encouraged to contact clinic should she have any questions or concerns prior to her return visit.     Follow up plan: - Return in about 4 weeks (around 05/25/2022) for Diabetes F/U, No previsit labs, Bring meter and logs.    Ronny Bacon, [facility] [address] [place], [place] [address] Phone: [phone] Fax: [phone]  04/28/2022, 7:12 AM

## 2022-04-27 NOTE — Patient Instructions (Signed)

## 2022-04-28 NOTE — Telephone Encounter (Signed)
Noted  

## 2022-06-01 ENCOUNTER — Ambulatory Visit (INDEPENDENT_AMBULATORY_CARE_PROVIDER_SITE_OTHER): Payer: Medicare Other | Admitting: Nurse Practitioner

## 2022-06-01 ENCOUNTER — Encounter: Payer: Self-pay | Admitting: Nurse Practitioner

## 2022-06-01 VITALS — BP 110/71 | HR 67 | Ht 64.0 in

## 2022-06-01 DIAGNOSIS — E1122 Type 2 diabetes mellitus with diabetic chronic kidney disease: Secondary | ICD-10-CM

## 2022-06-01 DIAGNOSIS — N1831 Chronic kidney disease, stage 3a: Secondary | ICD-10-CM

## 2022-06-01 DIAGNOSIS — Z794 Long term (current) use of insulin: Secondary | ICD-10-CM | POA: Diagnosis not present

## 2022-06-01 DIAGNOSIS — E559 Vitamin D deficiency, unspecified: Secondary | ICD-10-CM | POA: Diagnosis not present

## 2022-06-01 MED ORDER — BASAGLAR KWIKPEN 100 UNIT/ML ~~LOC~~ SOPN: 30.0000 [IU] | PEN_INJECTOR | Freq: Every day | SUBCUTANEOUS | 3 refills | Status: DC

## 2022-06-01 NOTE — Patient Instructions (Signed)

## 2022-06-01 NOTE — Progress Notes (Signed)
Endocrinology Follow Up Note       06/01/2022, 1:30 PM   Subjective:    Patient ID: Tonya Baker, female    DOB: 1950/08/15.  Tonya Baker is being seen in follow up after being seen in consultation for management of currently uncontrolled symptomatic diabetes requested by  Virgia Land, [place].   Past Medical History:  Diagnosis Date   Back pain    Obstructive sleep apnea    Type 2 diabetes mellitus with diabetic autonomic neuropathy, with long-term current use of insulin ([place])     Past Surgical History:  Procedure Laterality Date   APPLICATION OF ROBOTIC ASSISTANCE FOR SPINAL PROCEDURE N/A 03/10/2021   Procedure: APPLICATION OF ROBOTIC ASSISTANCE FOR SPINAL PROCEDURE;  Surgeon: Judith Part, MD;  Location: [facility];  Service: Neurosurgery;  Laterality: N/A;   BACK SURGERY N/A 06/2020   BREAST SURGERY      Social History   Socioeconomic History   Marital status: Single    Spouse name: Not on file   Number of children: Not on file   Years of education: Not on file   Highest education level: Not on file  Occupational History   Not on file  Tobacco Use   Smoking status: Former    Types: Cigarettes    Quit date: 107    Years since quitting: 34.1   Smokeless tobacco: Never  Substance and Sexual Activity   Alcohol use: Not Currently    Comment: socially   Drug use: Never   Sexual activity: Not Currently  Other Topics Concern   Not on file  Social History Narrative   Not on file   Social Determinants of Health   Financial Resource Strain: Not on file  Food Insecurity: Not on file  Transportation Needs: Not on file  Physical Activity: Not on file  Stress: Not on file  Social Connections: Not on file    History reviewed. No pertinent family history.  Outpatient Encounter Medications as of 06/01/2022  Medication Sig   acetaminophen (TYLENOL) 325 MG tablet Take 650 mg by mouth every 6 (six)  hours as needed (pain).   albuterol (PROVENTIL) (2.5 MG/3ML) 0.083% nebulizer solution Take 2.5 mg by nebulization every 8 (eight) hours as needed (congestion).   aspirin 81 MG chewable tablet Chew 81 mg by mouth every morning.   atorvastatin (LIPITOR) 40 MG tablet Take 40 mg by mouth at bedtime. 2100   bisacodyl (DULCOLAX) 10 MG suppository Place 10 mg rectally daily as needed (constipation).   cholecalciferol (VITAMIN D3) 25 MCG (1000 UNIT) tablet Take 1,000 Units by mouth every morning.   Continuous Blood Gluc Sensor (DEXCOM G7 SENSOR) MISC Inject 1 Application into the skin as directed. Change sensor every 10 days as directed.   cyclobenzaprine (FLEXERIL) 10 MG tablet Take 10 mg by mouth 3 (three) times daily.   docusate sodium (COLACE) 100 MG capsule Take 100 mg by mouth every morning.   doxycycline (VIBRAMYCIN) 100 MG capsule Take 100 mg by mouth every 12 (twelve) hours. 2100   ferrous sulfate 325 (65 FE) MG tablet Take 1 tablet (325 mg total) by mouth every Monday, Wednesday, and Friday.   furosemide (LASIX) 40  MG tablet Take 1 tablet (40 mg total) by mouth every other day.   gabapentin (NEURONTIN) 300 MG capsule Take 600 mg by mouth 3 (three) times daily.   magnesium citrate SOLN Take 30 mLs by mouth every 12 (twelve) hours as needed (constipation).   metoCLOPramide (REGLAN) 5 MG tablet Take 5 mg by mouth 4 (four) times daily -  before meals and at bedtime.   midodrine (PROAMATINE) 10 MG tablet Take 3 tablets (30 mg total) by mouth 3 (three) times daily. (Patient taking differently: Take 20 mg by mouth 3 (three) times daily.)   NOVOLOG FLEXPEN 100 UNIT/ML FlexPen Inject 4-10 Units into the skin 3 (three) times daily with meals. 4 units if glucose 90-150 and eating. 151-200= 5 units; 201-250= 6 units; 251-300=7 units; 301-350= 8 units; 351-400= 9 units; above 400 = 10 units   oxyCODONE (OXYCONTIN) 10 mg 12 hr tablet Take 10 mg by mouth 2 (two) times daily as needed.   pantoprazole (PROTONIX)  40 MG tablet Take 40 mg by mouth 2 (two) times daily.   polyethylene glycol (MIRALAX / GLYCOLAX) 17 g packet Take 17 g by mouth every morning.   potassium chloride SA (KLOR-CON M) 20 MEQ tablet Take 1 tablet (20 mEq total) by mouth every other day.   QUEtiapine (SEROQUEL) 25 MG tablet Take 25 mg by mouth at bedtime. 2100   saccharomyces boulardii (FLORASTOR) 250 MG capsule Take 250 mg by mouth 2 (two) times daily.   Semaglutide, 1 MG/DOSE, 4 MG/3ML SOPN Inject 1 mg as directed once a week.   sertraline (ZOLOFT) 100 MG tablet Take 100 mg by mouth every morning.   Skin Protectants, Misc. (EUCERIN) cream Apply 1 application topically daily.   temazepam (RESTORIL) 30 MG capsule Take 30 mg by mouth at bedtime.   [DISCONTINUED] Insulin Glargine (BASAGLAR KWIKPEN) 100 UNIT/ML 50 units with breakfast and 20 units at bedtime   furosemide (LASIX) 40 MG tablet Take 1 tablet (40 mg total) by mouth daily as needed for fluid or edema.   Insulin Glargine (BASAGLAR KWIKPEN) 100 UNIT/ML Inject 30 Units into the skin at bedtime. 50 units with breakfast and 20 units at bedtime   No facility-administered encounter medications on file as of 06/01/2022.    ALLERGIES: Allergies  Allergen Reactions   Duloxetine Hcl Other (See Comments)     tremors   Cortisone Other (See Comments)    Unknown reaction   Hydrocortisone Other (See Comments)    Unknown reaction   Latex Other (See Comments)    Unknown reaction    VACCINATION STATUS: Immunization History  Administered Date(s) Administered   Fluad Quad(high Dose 65+) 03/13/2021   Pneumococcal Polysaccharide-23 03/13/2021   Tdap 01/23/2021    Diabetes She presents for her follow-up diabetic visit. She has type 2 diabetes mellitus. Onset time: diagnosed at approx age of 72. Her disease course has been fluctuating. Hypoglycemia symptoms include nervousness/anxiousness, sweats and tremors. Pertinent negatives for diabetes include no weight loss. There are no  hypoglycemic complications. Diabetic complications include heart disease, nephropathy, peripheral neuropathy and PVD. Risk factors for coronary artery disease include diabetes mellitus, dyslipidemia, family history, obesity, sedentary lifestyle, hypertension and post-menopausal. Current diabetic treatment includes intensive insulin program (and ozempic). She is compliant with treatment most of the time. Her weight is decreasing steadily. She is following a generally healthy diet. Meal planning includes ADA exchanges. She has not had a previous visit with a dietitian. She rarely participates in exercise. Her home blood glucose trend is  decreasing steadily. Her breakfast blood glucose range is generally 130-140 mg/dl. Her lunch blood glucose range is generally >200 mg/dl. Her dinner blood glucose range is generally 140-180 mg/dl. Her bedtime blood glucose range is generally 140-180 mg/dl. Her overall blood glucose range is 140-180 mg/dl. (She presents today with her logs showing improved glycemic profile with near target readings overall.  She was not due for another A1c today.  She notes she has made better choices as far as meals go but is frustrated that she has not lost weight.  I still believe this is due to her being over insulinized.) An ACE inhibitor/angiotensin II receptor blocker is not being taken. She sees a podiatrist.Eye exam is current.     Review of systems  Constitutional: + stable body weight, current Body mass index is 40.34 kg/m., no fatigue, no subjective hyperthermia, no subjective hypothermia Eyes: no blurry vision, no xerophthalmia ENT: no sore throat, no nodules palpated in throat, no dysphagia/odynophagia, no hoarseness Cardiovascular: no chest pain, no shortness of breath, no palpitations, no leg swelling Respiratory: no cough, no shortness of breath Gastrointestinal: no nausea/vomiting/diarrhea Musculoskeletal: no muscle/joint aches Skin: no rashes, no hyperemia Neurological:  no tremors, no numbness, no tingling, no dizziness Psychiatric: no depression, no anxiety  Objective:     BP 110/71 (BP Location: Left Arm, Patient Position: Sitting, Cuff Size: Large)   Pulse 67   Ht '5\' 4"'$  (1.626 m)   BMI 40.34 kg/m   Wt Readings from Last 3 Encounters:  04/27/22 235 lb (106.6 kg)  12/02/21 224 lb (101.6 kg)  05/04/21 190 lb 0.6 oz (86.2 kg)     BP Readings from Last 3 Encounters:  06/01/22 110/71  04/27/22 132/85  12/02/21 126/76     Physical Exam- Limited  Constitutional:  Body mass index is 40.34 kg/m. , not in acute distress, normal state of mind Eyes:  EOMI, no exophthalmos Musculoskeletal: no gross deformities, strength intact in all four extremities, no gross restriction of joint movements Skin:  no rashes, no hyperemia Neurological: no tremor with outstretched hands    CMP ( most recent) CMP     Component Value Date/Time   NA 139 05/04/2021 1729   K 3.6 05/04/2021 1729   CL 102 05/04/2021 1729   CO2 28 05/04/2021 1729   GLUCOSE 200 (H) 05/04/2021 1729   BUN 26 (A) 10/29/2021 0000   CREATININE 1.2 (A) 10/29/2021 0000   CREATININE 1.23 (H) 05/04/2021 1729   CALCIUM 9.2 05/04/2021 1729   PROT 6.9 04/04/2021 1040   ALBUMIN 2.5 (L) 04/06/2021 0931   AST 21 04/04/2021 1040   ALT 11 04/04/2021 1040   ALKPHOS 161 (A) 10/29/2021 0000   BILITOT 0.7 04/04/2021 1040   GFRNONAA 48 (L) 05/04/2021 1729     Diabetic Labs (most recent): Lab Results  Component Value Date   HGBA1C 7.7 (A) 04/27/2022   HGBA1C 9 10/29/2021   HGBA1C 6.6 (H) 03/10/2021     Lipid Panel ( most recent) Lipid Panel     Component Value Date/Time   TRIG 182 (A) 10/29/2021 0000   LDLCALC 114 10/29/2021 0000      Lab Results  Component Value Date   TSH 2.03 10/29/2021   TSH 0.884 04/04/2021           Assessment & Plan:   1) Type 2 diabetes mellitus with stage 3a chronic kidney disease, with long-term current use of insulin ([place])  She presents today  with her logs showing improved glycemic profile  with near target readings overall.  She was not due for another A1c today.  She notes she has made better choices as far as meals go but is frustrated that she has not lost weight.  I still believe this is due to her being over insulinized.  She has no episodes of hypoglycemia documented.  - Jiayi Elick has currently uncontrolled symptomatic type 2 DM since 72 years of age, with most recent A1c of 7.7 %.   -Recent labs reviewed.  - I had a long discussion with her about the progressive nature of diabetes and the pathology behind its complications. -her diabetes is complicated by CKD, PAD, CAD, gastroparesis, neuropathy and she remains at a high risk for more acute and chronic complications which include CAD, CVA, CKD, retinopathy, and neuropathy. These are all discussed in detail with her.  The following Lifestyle Medicine recommendations according to [facility]) were discussed and offered to patient and she agrees to start the journey:  A. Whole Foods, Plant-based plate comprising of fruits and vegetables, plant-based proteins, whole-grain carbohydrates was discussed in detail with the patient.   A list for source of those nutrients were also provided to the patient.  Patient will use only water or unsweetened tea for hydration. B.  The need to stay away from risky substances including alcohol, smoking; obtaining 7 to 9 hours of restorative sleep, at least 150 minutes of moderate intensity exercise weekly, the importance of healthy social connections,  and stress reduction techniques were discussed. C.  A full color page of  Calorie density of various food groups per pound showing examples of each food groups was provided to the patient.  - Nutritional counseling repeated at each appointment due to patients tendency to fall back in to old habits.  - The patient admits there is a room for improvement in their diet and  drink choices. -  Suggestion is made for the patient to avoid simple carbohydrates from their diet including Cakes, Sweet Desserts / Pastries, Ice Cream, Soda (diet and regular), Sweet Tea, Candies, Chips, Cookies, Sweet Pastries, Store Bought Juices, Alcohol in Excess of 1-2 drinks a day, Artificial Sweeteners, Coffee Creamer, and "Sugar-free" Products. This will help patient to have stable blood glucose profile and potentially avoid unintended weight gain.   - I encouraged the patient to switch to unprocessed or minimally processed complex starch and increased protein intake (animal or plant source), fruits, and vegetables.   - Patient is advised to stick to a routine mealtimes to eat 3 meals a day and avoid unnecessary snacks (to snack only to correct hypoglycemia).  - I have approached her with the following individualized plan to manage her diabetes and patient agrees:   -She is agreeable to change her Basaglar to 30 units SQ nightly (instead of splitting doses).  She will continue her Novolog 4-10 units TID with meals if glucose is above 90 and she is eating (Specific instructions on how to titrate insulin dosage based on glucose readings given to patient in writing), and Ozempic 1 mg SQ weekly.  -she is encouraged to continue monitoring glucose 4 times daily, before meals and before bed, and to call the clinic if she has readings less than 70 or above 300 for 3 tests in a row.  She was not approved to have CGM device as she lives in ALF.  - she is warned not to take insulin without proper monitoring per orders. - Adjustment parameters are given to her  for hypo and hyperglycemia in writing.  -She did not tolerate Metformin in the past.  - Specific targets for  A1c; LDL, HDL, and Triglycerides were discussed with the patient.  2) Blood Pressure /Hypertension:  her blood pressure is controlled to target.   she is advised to continue her current medications including Lasix 40 mg po every  other day as needed for fluid.  She is also on Midodrine 30 mg po TID.  3) Lipids/Hyperlipidemia:    Review of her recent lipid panel from 10/29/21 showed uncontrolled LDL at 114 and elevated triglycerides of 182 .  she is advised to continue Lipitor 40 mg daily at bedtime.  Side effects and precautions discussed with her.  Will recheck lipid panel prior to next visit  4)  Weight/Diet:  her Body mass index is 40.34 kg/m.  -  clearly complicating her diabetes care.   she is a candidate for weight loss. I discussed with her the fact that loss of 5 - 10% of her  current body weight will have the most impact on her diabetes management.  Exercise, and detailed carbohydrates information provided  -  detailed on discharge instructions.  5) Chronic Care/Health Maintenance: -she is not on ACEI/ARB and is on Statin medications and is encouraged to initiate and continue to follow up with Ophthalmology, Dentist, Podiatrist at least yearly or according to recommendations, and advised to stay away from smoking. I have recommended yearly flu vaccine and pneumonia vaccine at least every 5 years; moderate intensity exercise for up to 150 minutes weekly; and sleep for at least 7 hours a day.  - she is advised to maintain close follow up with Virgia Land, FNP for primary care needs, as well as her other providers for optimal and coordinated care.     I spent  33  minutes in the care of the patient today including review of labs from [place], Lipids, Thyroid Function, Hematology (current and previous including abstractions from other facilities); face-to-face time discussing  her blood glucose readings/logs, discussing hypoglycemia and hyperglycemia episodes and symptoms, medications doses, her options of short and long term treatment based on the latest standards of care / guidelines;  discussion about incorporating lifestyle medicine;  and documenting the encounter. Risk reduction counseling performed per USPSTF  guidelines to reduce obesity and cardiovascular risk factors.     Please refer to Patient Instructions for Blood Glucose Monitoring and Insulin/Medications Dosing Guide"  in media tab for additional information. Please  also refer to " Patient Self Inventory" in the Media  tab for reviewed elements of pertinent patient history.  Chariya Deoliveira participated in the discussions, expressed understanding, and voiced agreement with the above plans.  All questions were answered to her satisfaction. she is encouraged to contact clinic should she have any questions or concerns prior to her return visit.     Follow up plan: - Return in about 2 months (around 07/31/2022) for Diabetes F/U with A1c in office.   Rayetta Pigg, [facility] [address] [place], [place] [address] Phone: [phone] Fax: [phone]  06/01/2022, 1:30 PM

## 2022-06-05 ENCOUNTER — Telehealth: Payer: Self-pay | Admitting: Nurse Practitioner

## 2022-06-05 NOTE — Telephone Encounter (Signed)
Tonya Baker from [facility] in [place] said she did not receive [facility] orders from her visit with on 2/26 about the changes In insulin. It also needs to be sent to her PCP. Thank you!!  [phone] fax number

## 2022-06-05 NOTE — Telephone Encounter (Signed)
They will call the pharmacy

## 2022-06-05 NOTE — Telephone Encounter (Signed)
I sent it to [facility], which is the pharmacy on file.  How would she like the order to be communicated with her?  Do we need to provide a physical Rx and fax it?

## 2022-06-29 ENCOUNTER — Other Ambulatory Visit: Payer: Self-pay | Admitting: Physician Assistant

## 2022-06-29 DIAGNOSIS — M4312 Spondylolisthesis, cervical region: Secondary | ICD-10-CM

## 2022-06-29 DIAGNOSIS — M960 Pseudarthrosis after fusion or arthrodesis: Secondary | ICD-10-CM

## 2022-07-01 ENCOUNTER — Telehealth: Payer: Self-pay | Admitting: Nurse Practitioner

## 2022-07-01 MED ORDER — BASAGLAR KWIKPEN 100 UNIT/ML ~~LOC~~ SOPN: 30.0000 [IU] | PEN_INJECTOR | Freq: Every day | SUBCUTANEOUS | 3 refills | Status: DC

## 2022-07-01 NOTE — Telephone Encounter (Signed)
Pt states that you were going to change her long acting to 30 units at night but nothing is at her pharmacy . Please advise

## 2022-07-01 NOTE — Telephone Encounter (Signed)
Called pt no answer °

## 2022-07-01 NOTE — Telephone Encounter (Signed)
I sent it to [facility] in [place] on 2/26.  Was I supposed to be sending it somewhere else?

## 2022-07-02 ENCOUNTER — Other Ambulatory Visit: Payer: Self-pay

## 2022-07-02 MED ORDER — BASAGLAR KWIKPEN 100 UNIT/ML ~~LOC~~ SOPN: 30.0000 [IU] | PEN_INJECTOR | Freq: Every day | SUBCUTANEOUS | 3 refills | Status: DC

## 2022-07-23 ENCOUNTER — Ambulatory Visit
Admission: RE | Admit: 2022-07-23 | Discharge: 2022-07-23 | Disposition: A | Discharge status: Home / Self Care | Payer: Medicare Other | Source: Ambulatory Visit | Attending: Physician Assistant | Admitting: Physician Assistant

## 2022-07-23 DIAGNOSIS — M4312 Spondylolisthesis, cervical region: Secondary | ICD-10-CM

## 2022-07-29 ENCOUNTER — Ambulatory Visit
Admission: RE | Admit: 2022-07-29 | Discharge: 2022-07-29 | Disposition: A | Discharge status: Home / Self Care | Payer: Medicare Other | Source: Ambulatory Visit | Attending: Physician Assistant | Admitting: Physician Assistant

## 2022-07-29 DIAGNOSIS — M960 Pseudarthrosis after fusion or arthrodesis: Secondary | ICD-10-CM

## 2022-08-03 ENCOUNTER — Ambulatory Visit (INDEPENDENT_AMBULATORY_CARE_PROVIDER_SITE_OTHER): Payer: Medicare Other | Admitting: Nurse Practitioner

## 2022-08-03 ENCOUNTER — Encounter: Payer: Self-pay | Admitting: Nurse Practitioner

## 2022-08-03 VITALS — BP 126/78 | HR 69 | Ht 64.0 in | Wt 240.0 lb

## 2022-08-03 DIAGNOSIS — E559 Vitamin D deficiency, unspecified: Secondary | ICD-10-CM | POA: Diagnosis not present

## 2022-08-03 DIAGNOSIS — Z794 Long term (current) use of insulin: Secondary | ICD-10-CM | POA: Diagnosis not present

## 2022-08-03 DIAGNOSIS — E1122 Type 2 diabetes mellitus with diabetic chronic kidney disease: Secondary | ICD-10-CM | POA: Diagnosis not present

## 2022-08-03 DIAGNOSIS — N1831 Chronic kidney disease, stage 3a: Secondary | ICD-10-CM | POA: Diagnosis not present

## 2022-08-03 LAB — POCT GLYCOSYLATED HEMOGLOBIN (HGB A1C): Hemoglobin A1C: 8.2 % — AB (ref 4.0–5.6)

## 2022-08-03 MED ORDER — BASAGLAR KWIKPEN 100 UNIT/ML ~~LOC~~ SOPN: 30.0000 [IU] | PEN_INJECTOR | Freq: Every day | SUBCUTANEOUS | 3 refills | Status: DC

## 2022-08-03 MED ORDER — NOVOLOG FLEXPEN 100 UNIT/ML ~~LOC~~ SOPN: 4.0000 [IU] | PEN_INJECTOR | Freq: Three times a day (TID) | SUBCUTANEOUS | 3 refills | Status: DC

## 2022-08-03 MED ORDER — SEMAGLUTIDE (1 MG/DOSE) 4 MG/3ML ~~LOC~~ SOPN: 1.0000 mg | PEN_INJECTOR | SUBCUTANEOUS | 3 refills | Status: DC

## 2022-08-03 NOTE — Progress Notes (Signed)
Endocrinology Follow Up Note       08/03/2022, 3:02 PM   Subjective:    Patient ID: Tonya Baker, female    DOB: 10/22/1950.  Tonya Baker is being seen in follow up after being seen in consultation for management of currently uncontrolled symptomatic diabetes requested by  Denton Meek, FNP.   Past Medical History:  Diagnosis Date   Back pain    Obstructive sleep apnea    Type 2 diabetes mellitus with diabetic autonomic neuropathy, with long-term current use of insulin (HCC)     Past Surgical History:  Procedure Laterality Date   APPLICATION OF ROBOTIC ASSISTANCE FOR SPINAL PROCEDURE N/A 03/10/2021   Procedure: APPLICATION OF ROBOTIC ASSISTANCE FOR SPINAL PROCEDURE;  Surgeon: [name], MD;  Location: [facility] OR;  Service: Neurosurgery;  Laterality: N/A;   BACK SURGERY N/A 06/2020   BREAST SURGERY      Social History   Socioeconomic History   Marital status: Single    Spouse name: Not on file   Number of children: Not on file   Years of education: Not on file   Highest education level: Not on file  Occupational History   Not on file  Tobacco Use   Smoking status: Former    Types: Cigarettes    Quit date: 44    Years since quitting: 34.3   Smokeless tobacco: Never  Substance and Sexual Activity   Alcohol use: Not Currently    Comment: socially   Drug use: Never   Sexual activity: Not Currently  Other Topics Concern   Not on file  Social History Narrative   Not on file   Social Determinants of Health   Financial Resource Strain: Not on file  Food Insecurity: Not on file  Transportation Needs: Not on file  Physical Activity: Not on file  Stress: Not on file  Social Connections: Not on file    History reviewed. No pertinent family history.  Outpatient Encounter Medications as of 08/03/2022  Medication Sig   acetaminophen (TYLENOL) 325 MG tablet Take 650 mg by mouth every 6 (six)  hours as needed (pain).   albuterol (PROVENTIL) (2.5 MG/3ML) 0.083% nebulizer solution Take 2.5 mg by nebulization every 8 (eight) hours as needed (congestion).   aspirin 81 MG chewable tablet Chew 81 mg by mouth every morning.   atorvastatin (LIPITOR) 40 MG tablet Take 40 mg by mouth at bedtime. 2100   bisacodyl (DULCOLAX) 10 MG suppository Place 10 mg rectally daily as needed (constipation).   cetirizine (ZYRTEC) 10 MG tablet Take 10 mg by mouth at bedtime.   cholecalciferol (VITAMIN D3) 25 MCG (1000 UNIT) tablet Take 1,000 Units by mouth every morning.   docusate sodium (COLACE) 100 MG capsule Take 100 mg by mouth every morning.   doxycycline (VIBRAMYCIN) 100 MG capsule Take 100 mg by mouth every 12 (twelve) hours. 2100   ferrous sulfate 325 (65 FE) MG tablet Take 1 tablet (325 mg total) by mouth every Monday, Wednesday, and Friday.   furosemide (LASIX) 40 MG tablet Take 1 tablet (40 mg total) by mouth daily as needed for fluid or edema.   furosemide (LASIX) 40 MG tablet Take 1 tablet (  40 mg total) by mouth every other day.   gabapentin (NEURONTIN) 300 MG capsule Take 600 mg by mouth 3 (three) times daily.   HYDROcodone-acetaminophen (NORCO) 7.5-325 MG tablet Take 1 tablet by mouth every 6 (six) hours as needed.   metoCLOPramide (REGLAN) 5 MG tablet Take 5 mg by mouth 4 (four) times daily -  before meals and at bedtime.   midodrine (PROAMATINE) 10 MG tablet Take 3 tablets (30 mg total) by mouth 3 (three) times daily. (Patient taking differently: Take 20 mg by mouth 3 (three) times daily.)   pantoprazole (PROTONIX) 40 MG tablet Take 40 mg by mouth 2 (two) times daily.   polyethylene glycol (MIRALAX / GLYCOLAX) 17 g packet Take 17 g by mouth every morning.   potassium chloride SA (KLOR-CON M) 20 MEQ tablet Take 1 tablet (20 mEq total) by mouth every other day.   QUEtiapine (SEROQUEL) 25 MG tablet Take 25 mg by mouth at bedtime. 2100   saccharomyces boulardii (FLORASTOR) 250 MG capsule Take 250  mg by mouth 2 (two) times daily.   sertraline (ZOLOFT) 100 MG tablet Take 100 mg by mouth every morning.   Skin Protectants, Misc. (EUCERIN) cream Apply 1 application topically daily.   temazepam (RESTORIL) 30 MG capsule Take 30 mg by mouth at bedtime.   [DISCONTINUED] Insulin Glargine (BASAGLAR KWIKPEN) 100 UNIT/ML Inject 30 Units into the skin at bedtime. 50 units with breakfast and 20 units at bedtime   [DISCONTINUED] NOVOLOG FLEXPEN 100 UNIT/ML FlexPen Inject 4-10 Units into the skin 3 (three) times daily with meals. 4 units if glucose 90-150 and eating. 151-200= 5 units; 201-250= 6 units; 251-300=7 units; 301-350= 8 units; 351-400= 9 units; above 400 = 10 units   [DISCONTINUED] Semaglutide, 1 MG/DOSE, 4 MG/3ML SOPN Inject 1 mg as directed once a week.   Continuous Blood Gluc Sensor (DEXCOM G7 SENSOR) MISC Inject 1 Application into the skin as directed. Change sensor every 10 days as directed. (Patient not taking: Reported on 08/03/2022)   cyclobenzaprine (FLEXERIL) 10 MG tablet Take 10 mg by mouth 3 (three) times daily. (Patient not taking: Reported on 08/03/2022)   insulin aspart (NOVOLOG FLEXPEN) 100 UNIT/ML FlexPen Inject 4-10 Units into the skin 3 (three) times daily with meals. 4 units if glucose 90-150 and eating. 151-200= 5 units; 201-250= 6 units; 251-300=7 units; 301-350= 8 units; 351-400= 9 units; above 400 = 10 units   Insulin Glargine (BASAGLAR KWIKPEN) 100 UNIT/ML Inject 30 Units into the skin at bedtime.   magnesium citrate SOLN Take 30 mLs by mouth every 12 (twelve) hours as needed (constipation). (Patient not taking: Reported on 08/03/2022)   oxyCODONE (OXYCONTIN) 10 mg 12 hr tablet Take 10 mg by mouth 2 (two) times daily as needed. (Patient not taking: Reported on 08/03/2022)   Semaglutide, 1 MG/DOSE, 4 MG/3ML SOPN Inject 1 mg as directed once a week.   No facility-administered encounter medications on file as of 08/03/2022.    ALLERGIES: Allergies  Allergen Reactions    Duloxetine Hcl Other (See Comments)     tremors   Cortisone Other (See Comments)    Unknown reaction   Hydrocortisone Other (See Comments)    Unknown reaction   Latex Other (See Comments)    Unknown reaction    VACCINATION STATUS: Immunization History  Administered Date(s) Administered   Fluad Quad(high Dose 65+) 03/13/2021   Pneumococcal Polysaccharide-23 03/13/2021   Tdap 01/23/2021    Diabetes She presents for her follow-up diabetic visit. She has type 2  diabetes mellitus. Onset time: diagnosed at approx age of 7. Her disease course has been fluctuating. There are no hypoglycemic associated symptoms. Pertinent negatives for diabetes include no weight loss. There are no hypoglycemic complications. Diabetic complications include heart disease, nephropathy, peripheral neuropathy and PVD. Risk factors for coronary artery disease include diabetes mellitus, dyslipidemia, family history, obesity, sedentary lifestyle, hypertension and post-menopausal. Current diabetic treatment includes intensive insulin program (and ozempic). She is compliant with treatment most of the time. Her weight is decreasing steadily. She is following a generally healthy diet. Meal planning includes ADA exchanges. She has not had a previous visit with a dietitian. She rarely participates in exercise. Her home blood glucose trend is fluctuating minimally. Her breakfast blood glucose range is generally 180-200 mg/dl. Her lunch blood glucose range is generally 180-200 mg/dl. Her dinner blood glucose range is generally 110-130 mg/dl. Her bedtime blood glucose range is generally 140-180 mg/dl. Her overall blood glucose range is 140-180 mg/dl. (She presents today with her logs showing improved glycemic profile with near target readings overall, morning readings often higher than bedtime readings.  Her POCT A1c today is 8.2%, increasing from last visit of 7.7%.  Her medication orders at the ALF were never changed to reflect the  orders given at last visit.) An ACE inhibitor/angiotensin II receptor blocker is not being taken. She sees a podiatrist.Eye exam is current.     Review of systems  Constitutional: + steadily increasing body weight,  current Body mass index is 41.2 kg/m. , no fatigue, no subjective hyperthermia, no subjective hypothermia Eyes: no blurry vision, no xerophthalmia ENT: no sore throat, no nodules palpated in throat, no dysphagia/odynophagia, no hoarseness Cardiovascular: no chest pain, no shortness of breath, no palpitations, no leg swelling Respiratory: no cough, no shortness of breath Gastrointestinal: no nausea/vomiting/diarrhea Musculoskeletal: no muscle/joint aches Skin: no rashes, no hyperemia Neurological: no tremors, no numbness, no tingling, no dizziness Psychiatric: no depression, no anxiety  Objective:     BP 126/78 (BP Location: Left Arm, Patient Position: Sitting, Cuff Size: Large)   Pulse 69   Ht 5\' 4"  (1.626 m)   Wt 240 lb (108.9 kg) Comment: per patient  BMI 41.20 kg/m   Wt Readings from Last 3 Encounters:  08/03/22 240 lb (108.9 kg)  04/27/22 235 lb (106.6 kg)  12/02/21 224 lb (101.6 kg)     BP Readings from Last 3 Encounters:  08/03/22 126/78  06/01/22 110/71  04/27/22 132/85      Physical Exam- Limited  Constitutional:  Body mass index is 41.2 kg/m. , not in acute distress, normal state of mind Eyes:  EOMI, no exophthalmos Musculoskeletal: no gross deformities, strength intact in all four extremities, no gross restriction of joint movements Skin:  no rashes, no hyperemia Neurological: no tremor with outstretched hands   Diabetic Foot Exam - Simple   No data filed    CMP ( most recent) CMP     Component Value Date/Time   NA 139 05/04/2021 1729   K 3.6 05/04/2021 1729   CL 102 05/04/2021 1729   CO2 28 05/04/2021 1729   GLUCOSE 200 (H) 05/04/2021 1729   BUN 26 (A) 10/29/2021 0000   CREATININE 1.2 (A) 10/29/2021 0000   CREATININE 1.23 (H)  05/04/2021 1729   CALCIUM 9.2 05/04/2021 1729   PROT 6.9 04/04/2021 1040   ALBUMIN 2.5 (L) 04/06/2021 0931   AST 21 04/04/2021 1040   ALT 11 04/04/2021 1040   ALKPHOS 161 (A) 10/29/2021 0000   BILITOT  0.7 04/04/2021 1040   GFRNONAA 48 (L) 05/04/2021 1729     Diabetic Labs (most recent): Lab Results  Component Value Date   HGBA1C 8.2 (A) 08/03/2022   HGBA1C 7.7 (A) 04/27/2022   HGBA1C 9 10/29/2021     Lipid Panel ( most recent) Lipid Panel     Component Value Date/Time   TRIG 182 (A) 10/29/2021 0000   LDLCALC 114 10/29/2021 0000      Lab Results  Component Value Date   TSH 2.03 10/29/2021   TSH 0.884 04/04/2021           Assessment & Plan:   1) Type 2 diabetes mellitus with stage 3a chronic kidney disease, with long-term current use of insulin (HCC)  She presents today with her logs showing improved glycemic profile with near target readings overall, morning readings often higher than bedtime readings.  Her POCT A1c today is 8.2%, increasing from last visit of 7.7%.  Her medication orders at the ALF were never changed to reflect the orders given at last visit.  - Tonya Baker has currently uncontrolled symptomatic type 2 DM since 72 years of age.   -Recent labs reviewed.  - I had a long discussion with her about the progressive nature of diabetes and the pathology behind its complications. -her diabetes is complicated by CKD, PAD, CAD, gastroparesis, neuropathy and she remains at a high risk for more acute and chronic complications which include CAD, CVA, CKD, retinopathy, and neuropathy. These are all discussed in detail with her.  The following Lifestyle Medicine recommendations according to American College of Lifestyle Medicine [facility]) were discussed and offered to patient and she agrees to start the journey:  A. Whole Foods, Plant-based plate comprising of fruits and vegetables, plant-based proteins, whole-grain carbohydrates was discussed in detail with the  patient.   A list for source of those nutrients were also provided to the patient.  Patient will use only water or unsweetened tea for hydration. B.  The need to stay away from risky substances including alcohol, smoking; obtaining 7 to 9 hours of restorative sleep, at least 150 minutes of moderate intensity exercise weekly, the importance of healthy social connections,  and stress reduction techniques were discussed. C.  A full color page of  Calorie density of various food groups per pound showing examples of each food groups was provided to the patient.  - Nutritional counseling repeated at each appointment due to patients tendency to fall back in to old habits.  - The patient admits there is a room for improvement in their diet and drink choices. -  Suggestion is made for the patient to avoid simple carbohydrates from their diet including Cakes, Sweet Desserts / Pastries, Ice Cream, Soda (diet and regular), Sweet Tea, Candies, Chips, Cookies, Sweet Pastries, Store Bought Juices, Alcohol in Excess of 1-2 drinks a day, Artificial Sweeteners, Coffee Creamer, and "Sugar-free" Products. This will help patient to have stable blood glucose profile and potentially avoid unintended weight gain.   - I encouraged the patient to switch to unprocessed or minimally processed complex starch and increased protein intake (animal or plant source), fruits, and vegetables.   - Patient is advised to stick to a routine mealtimes to eat 3 meals a day and avoid unnecessary snacks (to snack only to correct hypoglycemia).  - I have approached her with the following individualized plan to manage her diabetes and patient agrees:   -She is agreeable to change her Basaglar to 30 units SQ nightly (instead of  splitting doses)- will send orders to [facility] in [place] as well as written orders to her ALF so that this can be carried out.  She will continue her Novolog 4-10 units TID with meals if glucose is above 90 and she is  eating (Specific instructions on how to titrate insulin dosage based on glucose readings given to patient in writing), and Ozempic 1 mg SQ weekly.  -she is encouraged to continue monitoring glucose 4 times daily, before meals and before bed, and to call the clinic if she has readings less than 70 or above 300 for 3 tests in a row.  She was not approved to have CGM device as she lives in ALF.  - she is warned not to take insulin without proper monitoring per orders. - Adjustment parameters are given to her for hypo and hyperglycemia in writing.  -She did not tolerate Metformin in the past.  - Specific targets for  A1c; LDL, HDL, and Triglycerides were discussed with the patient.  2) Blood Pressure /Hypertension:  her blood pressure is controlled to target.   she is advised to continue her current medications including Lasix 40 mg po every other day as needed for fluid.  She is also on Midodrine 30 mg po TID.  3) Lipids/Hyperlipidemia:    Review of her recent lipid panel from 10/29/21 showed uncontrolled LDL at 114 and elevated triglycerides of 182 .  she is advised to continue Lipitor 40 mg daily at bedtime.  Side effects and precautions discussed with her.  Will recheck lipid panel prior to next visit  4)  Weight/Diet:  her Body mass index is 41.2 kg/m.  -  clearly complicating her diabetes care.   she is a candidate for weight loss. I discussed with her the fact that loss of 5 - 10% of her  current body weight will have the most impact on her diabetes management.  Exercise, and detailed carbohydrates information provided  -  detailed on discharge instructions.  5) Chronic Care/Health Maintenance: -she is not on ACEI/ARB and is on Statin medications and is encouraged to initiate and continue to follow up with Ophthalmology, Dentist, Podiatrist at least yearly or according to recommendations, and advised to stay away from smoking. I have recommended yearly flu vaccine and pneumonia vaccine at  least every 5 years; moderate intensity exercise for up to 150 minutes weekly; and sleep for at least 7 hours a day.  - she is advised to maintain close follow up with Denton Meek, FNP for primary care needs, as well as her other providers for optimal and coordinated care.     I spent  45  minutes in the care of the patient today including review of labs from CMP, Lipids, Thyroid Function, Hematology (current and previous including abstractions from other facilities); face-to-face time discussing  her blood glucose readings/logs, discussing hypoglycemia and hyperglycemia episodes and symptoms, medications doses, her options of short and long term treatment based on the latest standards of care / guidelines;  discussion about incorporating lifestyle medicine;  and documenting the encounter. Risk reduction counseling performed per USPSTF guidelines to reduce obesity and cardiovascular risk factors.     Please refer to Patient Instructions for Blood Glucose Monitoring and Insulin/Medications Dosing Guide"  in media tab for additional information. Please  also refer to " Patient Self Inventory" in the Media  tab for reviewed elements of pertinent patient history.  Tonya Baker participated in the discussions, expressed understanding, and voiced agreement with the above  plans.  All questions were answered to her satisfaction. she is encouraged to contact clinic should she have any questions or concerns prior to her return visit.     Follow up plan: - Return in about 3 months (around 11/02/2022) for Diabetes F/U with A1c in office, Previsit labs, Bring meter and logs.   Ronny Bacon, [facility] [address] [place], [place] [address] Phone: [phone] Fax: [phone]  08/03/2022, 3:02 PM

## 2022-08-05 ENCOUNTER — Telehealth: Payer: Self-pay | Admitting: *Deleted

## 2022-08-05 NOTE — Telephone Encounter (Signed)
Misty from the [facility] called for clarification on the Basaglar. They had that the patient is to inject 50 units with breakfast, and 30 units at bedtime.  Per the patient's last office note and hearing verbally from Whitney Reardon,NP , the patient is to inject 30 units at bedtime only. [facility] was called, spoke to [place], and gave her the clarification that she needed for Basaglar inject 30 units at bedtime. Shanda Bumps commented that she would take care of it.

## 2022-08-25 ENCOUNTER — Telehealth: Payer: Self-pay | Admitting: Nurse Practitioner

## 2022-08-25 MED ORDER — FIASP FLEXTOUCH 100 UNIT/ML ~~LOC~~ SOPN: 4.0000 [IU] | PEN_INJECTOR | Freq: Three times a day (TID) | SUBCUTANEOUS | 3 refills | Status: DC

## 2022-08-25 NOTE — Telephone Encounter (Signed)
I sent in [place] in place of Novolog.

## 2022-08-25 NOTE — Telephone Encounter (Signed)
Pts ins does not cover Novolog anymore.  Is there something else you can call in for her?

## 2022-08-28 ENCOUNTER — Other Ambulatory Visit: Payer: Self-pay | Admitting: Nurse Practitioner

## 2022-08-28 MED ORDER — FIASP FLEXTOUCH 100 UNIT/ML ~~LOC~~ SOPN: 4.0000 [IU] | PEN_INJECTOR | Freq: Three times a day (TID) | SUBCUTANEOUS | 3 refills | Status: AC

## 2022-08-28 MED ORDER — BASAGLAR KWIKPEN 100 UNIT/ML ~~LOC~~ SOPN: 30.0000 [IU] | PEN_INJECTOR | Freq: Every day | SUBCUTANEOUS | 3 refills | Status: AC

## 2022-11-02 ENCOUNTER — Ambulatory Visit: Payer: Medicare Other | Admitting: Nurse Practitioner

## 2022-11-02 ENCOUNTER — Ambulatory Visit (INDEPENDENT_AMBULATORY_CARE_PROVIDER_SITE_OTHER): Payer: Medicare Other | Admitting: Podiatry

## 2022-11-02 ENCOUNTER — Encounter: Payer: Self-pay | Admitting: Podiatry

## 2022-11-02 DIAGNOSIS — M14672 Charcot's joint, left ankle and foot: Secondary | ICD-10-CM

## 2022-11-02 NOTE — Progress Notes (Signed)
  Subjective:  Patient ID: Tonya Baker, female    DOB: 1951/02/06,  MRN: [id number]  Chief Complaint  Patient presents with   Foot Pain    "I have Charcot Syndrome.  I have problems with my ankle.  My leg is starting to bow out and my ankle is starting to roll over.  It's causing pain in my hip.  It's causing my leg to be shorter.  I was ordered a brace.  The doctor wanted me to see you to make sure it was okay." N - ankle  L - left D - 4 years O - gradually worse C - ankle rolls A - by the end of the day, it gets worse T - doctor ordered a brace, I tried a short brace at one time    72 y.o. female presents with the above complaint. History confirmed with patient.  Her A1c is elevated over 8% currently.  She ambulates minimally.  She lives in an assisted living facility.  Most of her lack of ambulation is due to her left lower extremity deformity  Objective:  Physical Exam: warm, good capillary refill, no trophic changes or ulcerative lesions, normal DP and PT pulses, normal sensory exam, and valgus position of ankle joint,.  2 cm limb length discrepancy    Radiographs: Multiple views x-ray of the left ankle were reviewed from 01/26/2022, she has Charcot of the ankle and subtalar joint with valgus deformity and talar body collapse Assessment:   1. Charcot ankle, left      Plan:  Patient was evaluated and treated and all questions answered.  I reviewed her previous radiographs.  We discussed etiology and treatment options of Charcot deformity.  Given her age and functional status I would recommend continue with nonoperative treatment, she previously has had a double upright brace which she said was not comfortable and did not help her walking.  I recommended a reevaluation by the orthotist with a Jennye Moccasin boot to see if this offers any more additional support.  I also discussed with her surgical reconstruction would be risky and likely would require TTC intramedullary nail and that her A1c  would have to be closer to 7.5 to proceed with this  No follow-ups on file.

## 2022-11-18 ENCOUNTER — Ambulatory Visit: Payer: Medicare Other | Admitting: Nurse Practitioner

## 2022-11-18 DIAGNOSIS — E559 Vitamin D deficiency, unspecified: Secondary | ICD-10-CM

## 2022-11-18 DIAGNOSIS — Z7985 Long-term (current) use of injectable non-insulin antidiabetic drugs: Secondary | ICD-10-CM

## 2022-11-18 DIAGNOSIS — N1831 Chronic kidney disease, stage 3a: Secondary | ICD-10-CM

## 2022-11-18 DIAGNOSIS — Z794 Long term (current) use of insulin: Secondary | ICD-10-CM

## 2022-12-28 IMAGING — CT CT HEAD W/O CM
3 series · 16 of 47 positions shown, 19 images · non-contrast
Comparison: None.

CLINICAL DATA: Delirium.

EXAM:
CT HEAD WITHOUT CONTRAST
TECHNIQUE: Contiguous axial images were obtained from the base of the skull
through the vertex without intravenous contrast.

[Series 2: head wo · axial · 0.41mm/px · z∈[+1040,+1175]mm · 10 of 33 slices shown, 13 images]
[im 3/33  brain]
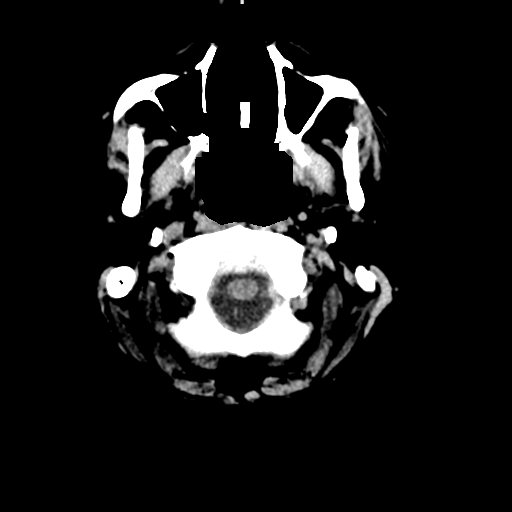
[im 3/33  bone]
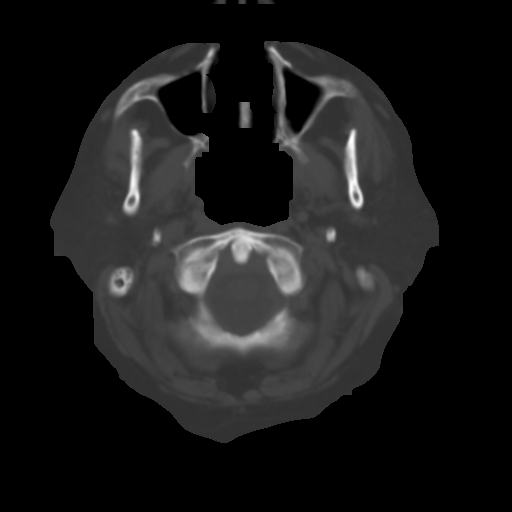
[im 6/33  brain]
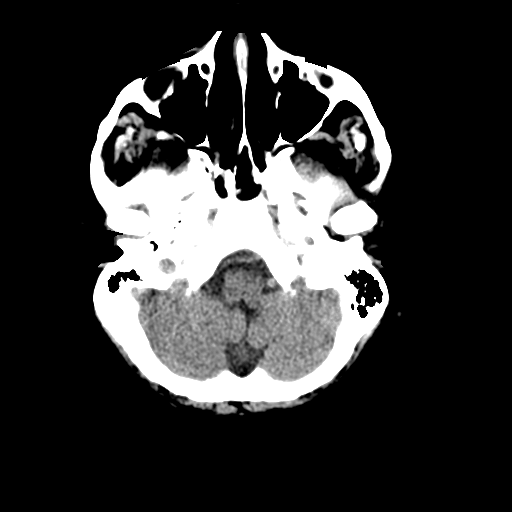
[im 9/33  brain]
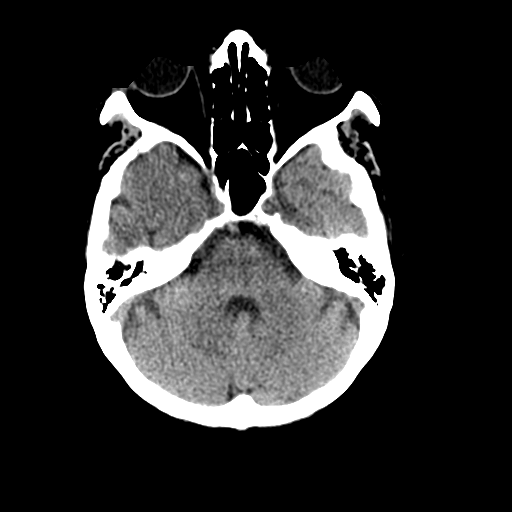
[im 12/33  brain]
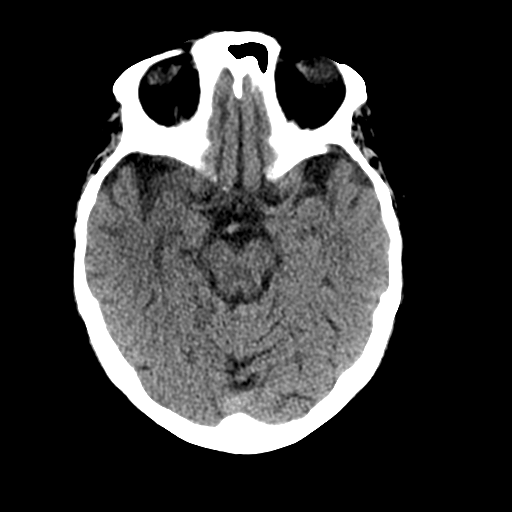
[im 15/33  brain]
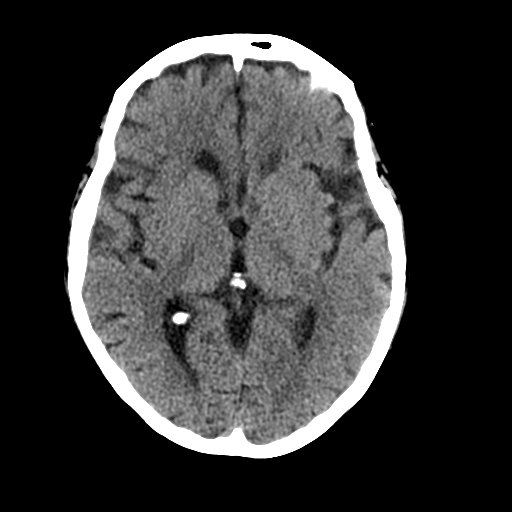
[im 15/33  bone]
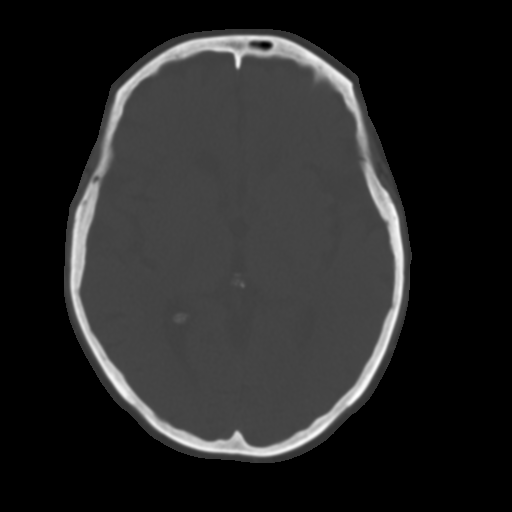
[im 18/33  brain]
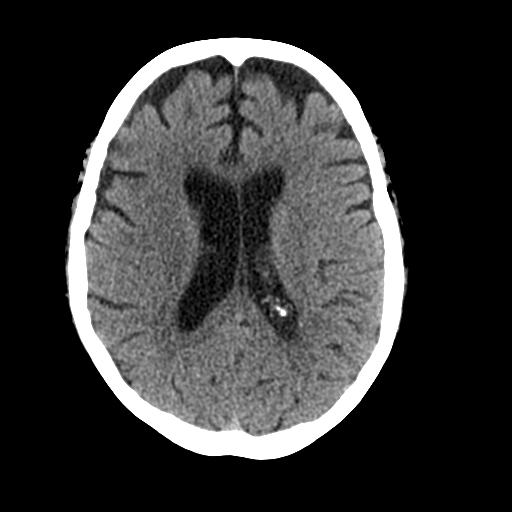
[im 21/33  brain]
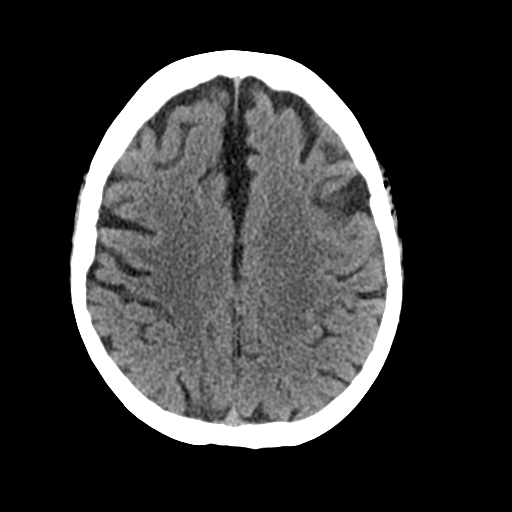
[im 25/33  brain]
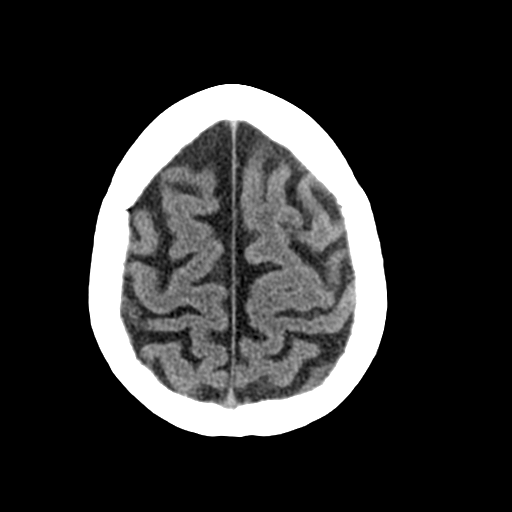
[im 27/33  brain]
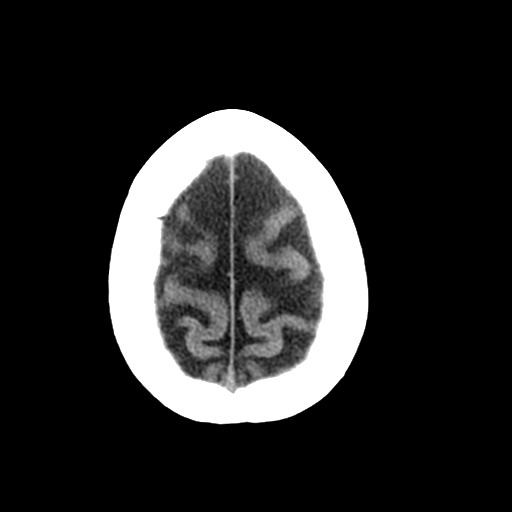
[im 27/33  bone]
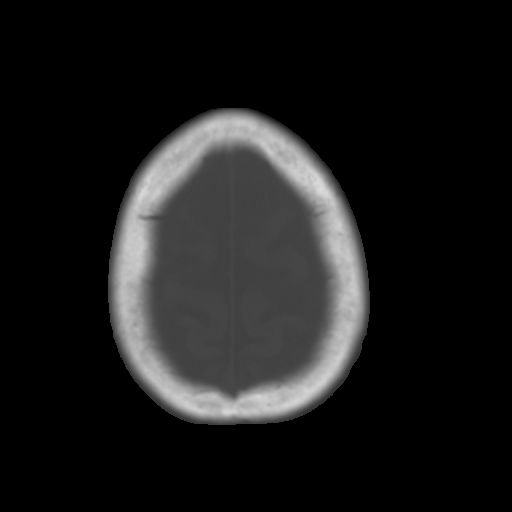
[im 30/33  brain]
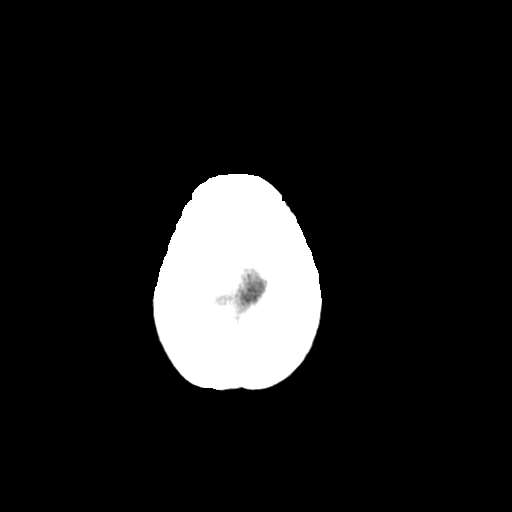

[Series 5: coronal soft tissue · coronal · 0.32mm/px · 3 of 69 slices shown]
[im 23/69  brain]
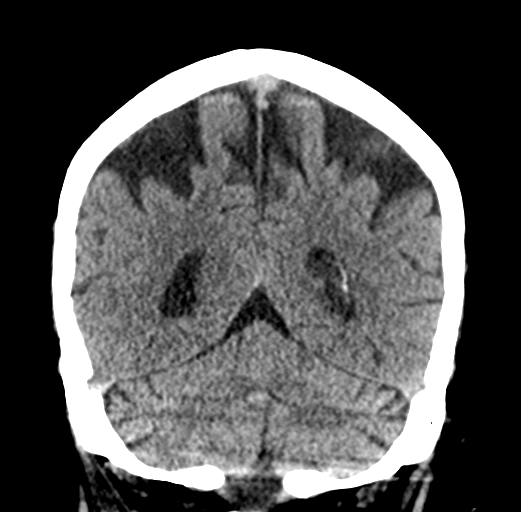
[im 31/69  brain]
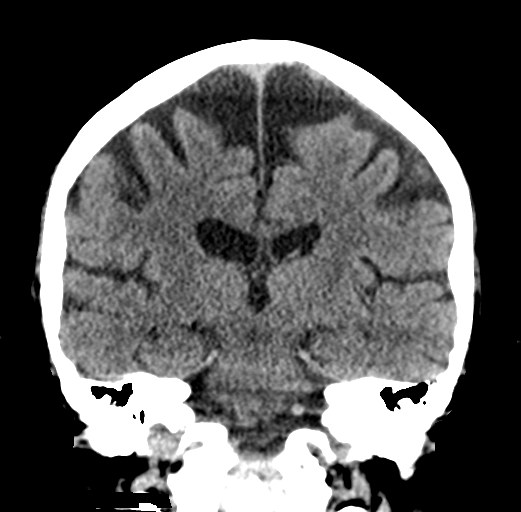
[im 38/69  brain]
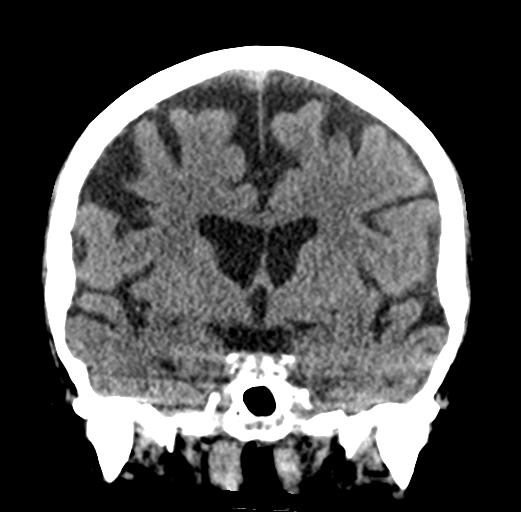

[Series 6: sagittal soft tissue · sagittal · 0.32mm/px · 3 of 55 slices shown]
[im 19/55  brain]
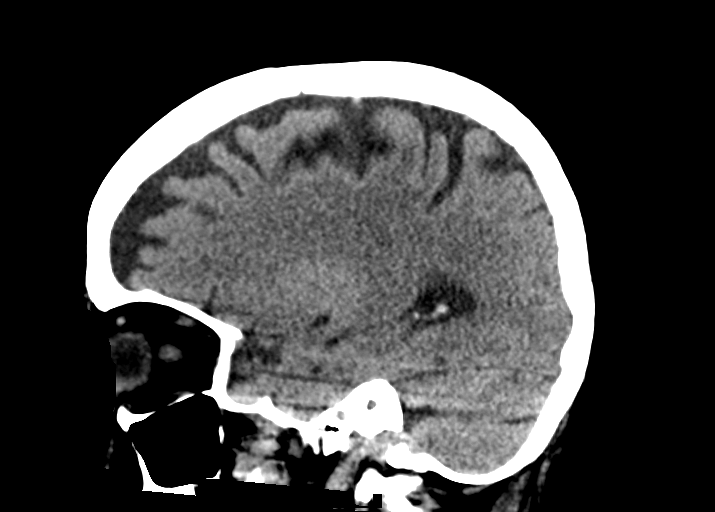
[im 28/55  brain]
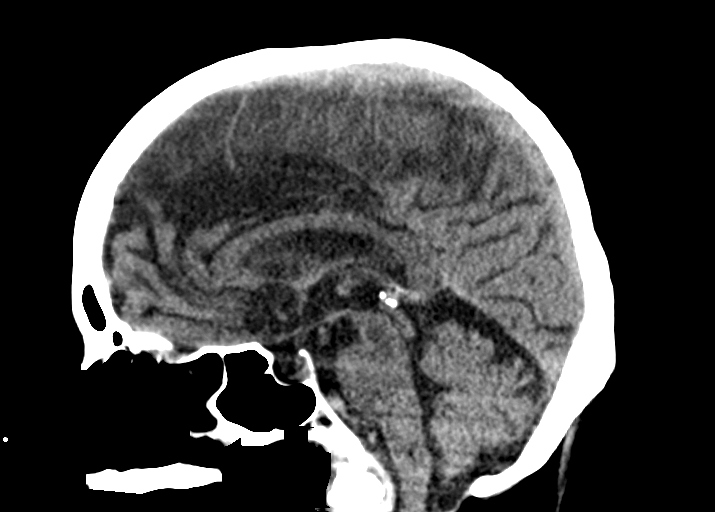
[im 37/55  brain]
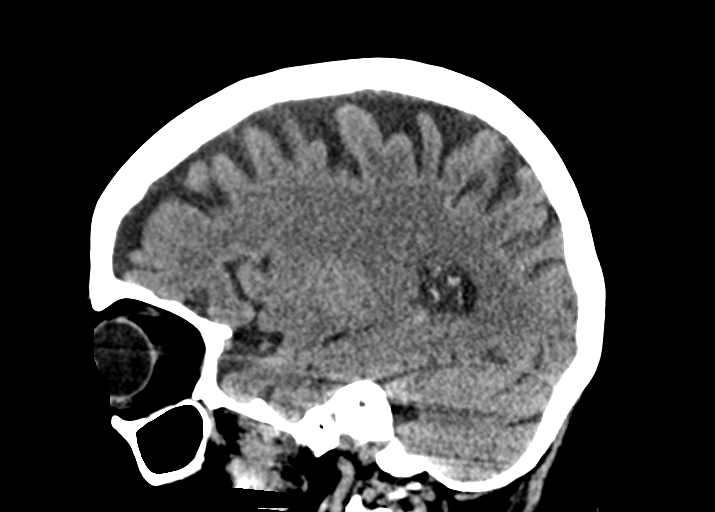

[16 of 47 positions shown; findings below may reference images not displayed]

FINDINGS: Brain: There is no evidence of an acute infarct, intracranial
hemorrhage, mass, midline shift, or extra-axial fluid collection.
There is mild cerebral atrophy.

Vascular: Calcified atherosclerosis at the skull base. No hyperdense
vessel.

Skull: No fracture or suspicious osseous lesion.

Sinuses/Orbits: Visualized paranasal sinuses and mastoid air cells
are clear. Right cataract extraction.

Other: None.
IMPRESSION: No evidence of acute intracranial abnormality.

## 2022-12-28 IMAGING — CR DG CHEST 2V
2 series · 2 of 2 positions shown · non-contrast
Comparison: None.

CLINICAL DATA: Altered mental status.

EXAM:
CHEST - 2 VIEW

[w chest lat]
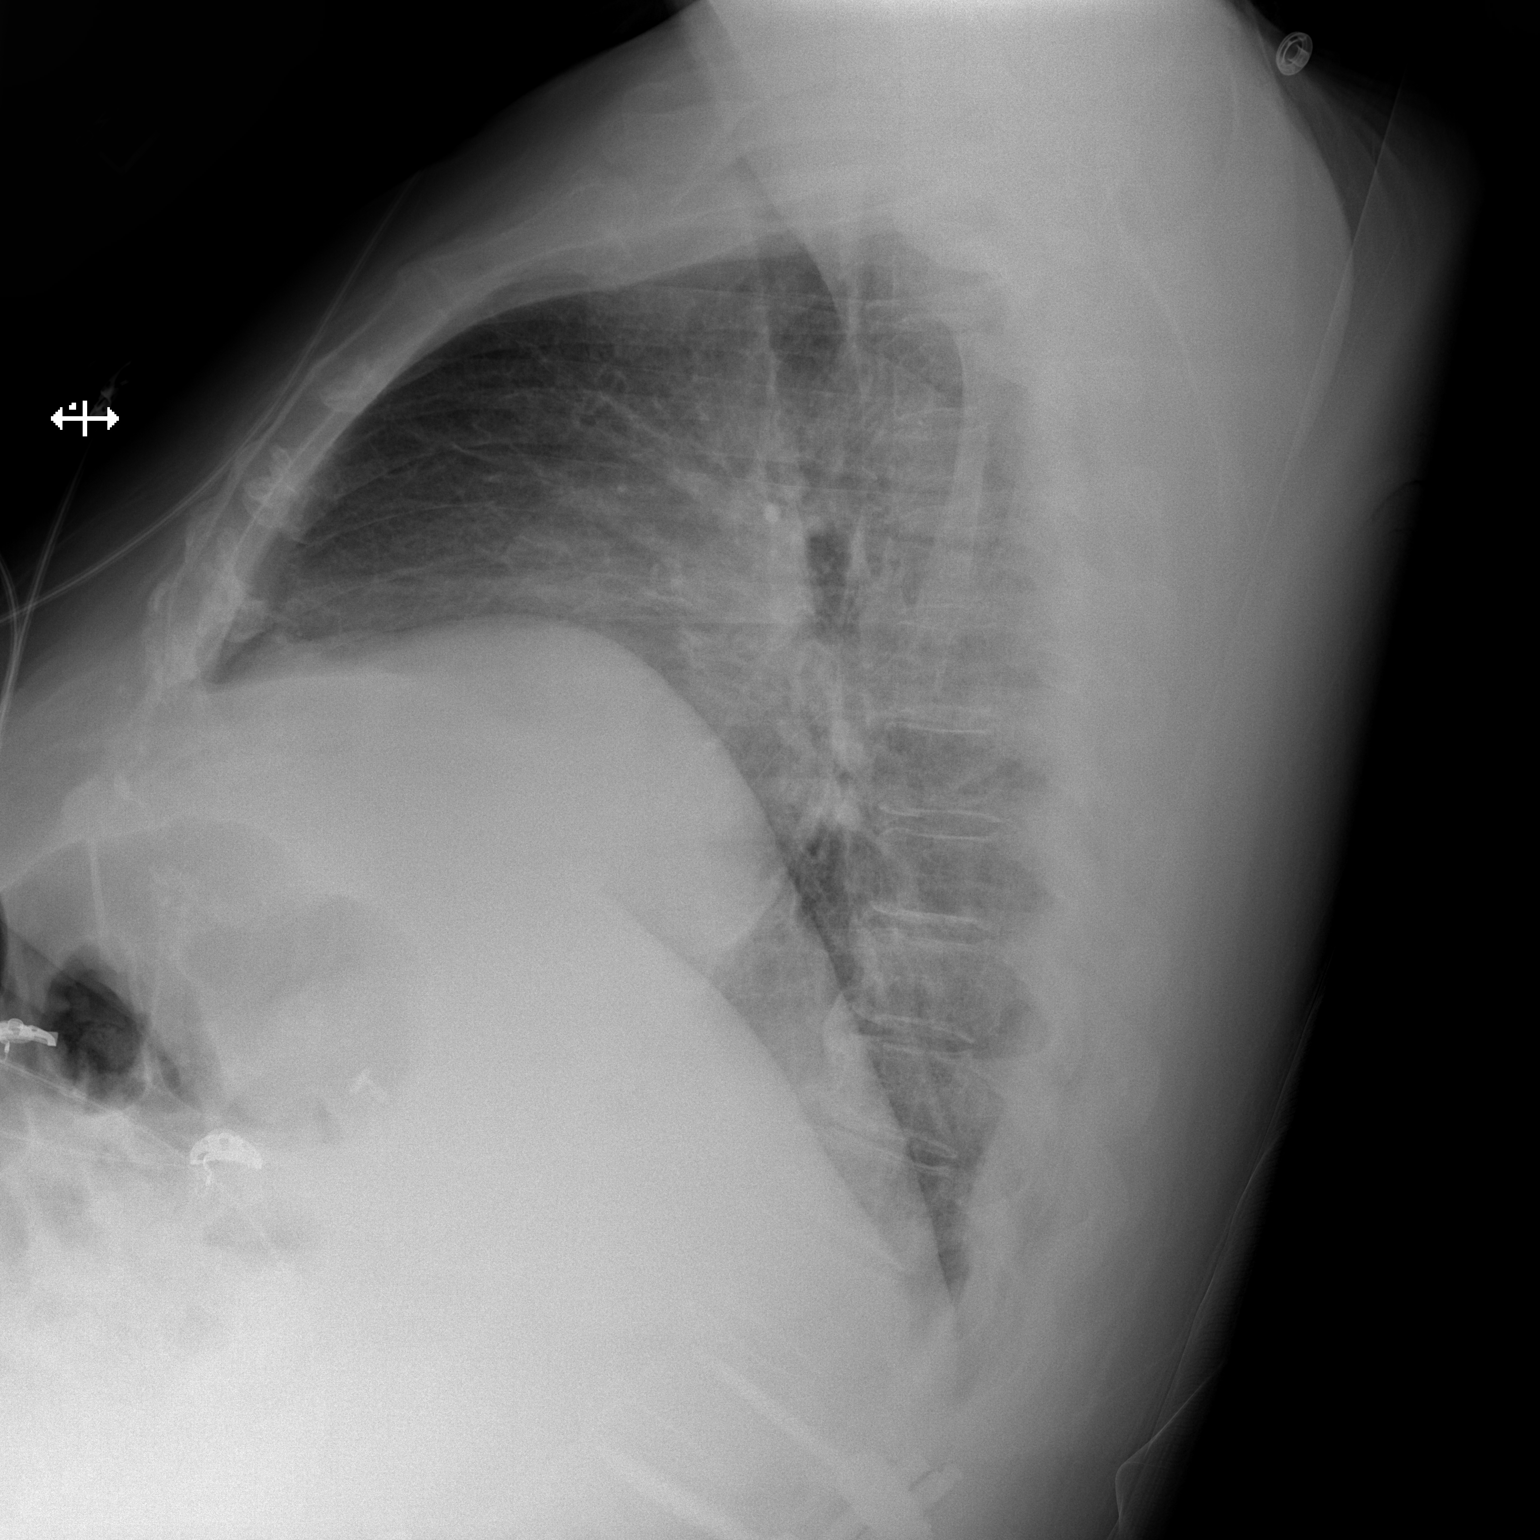

[x chest ap]
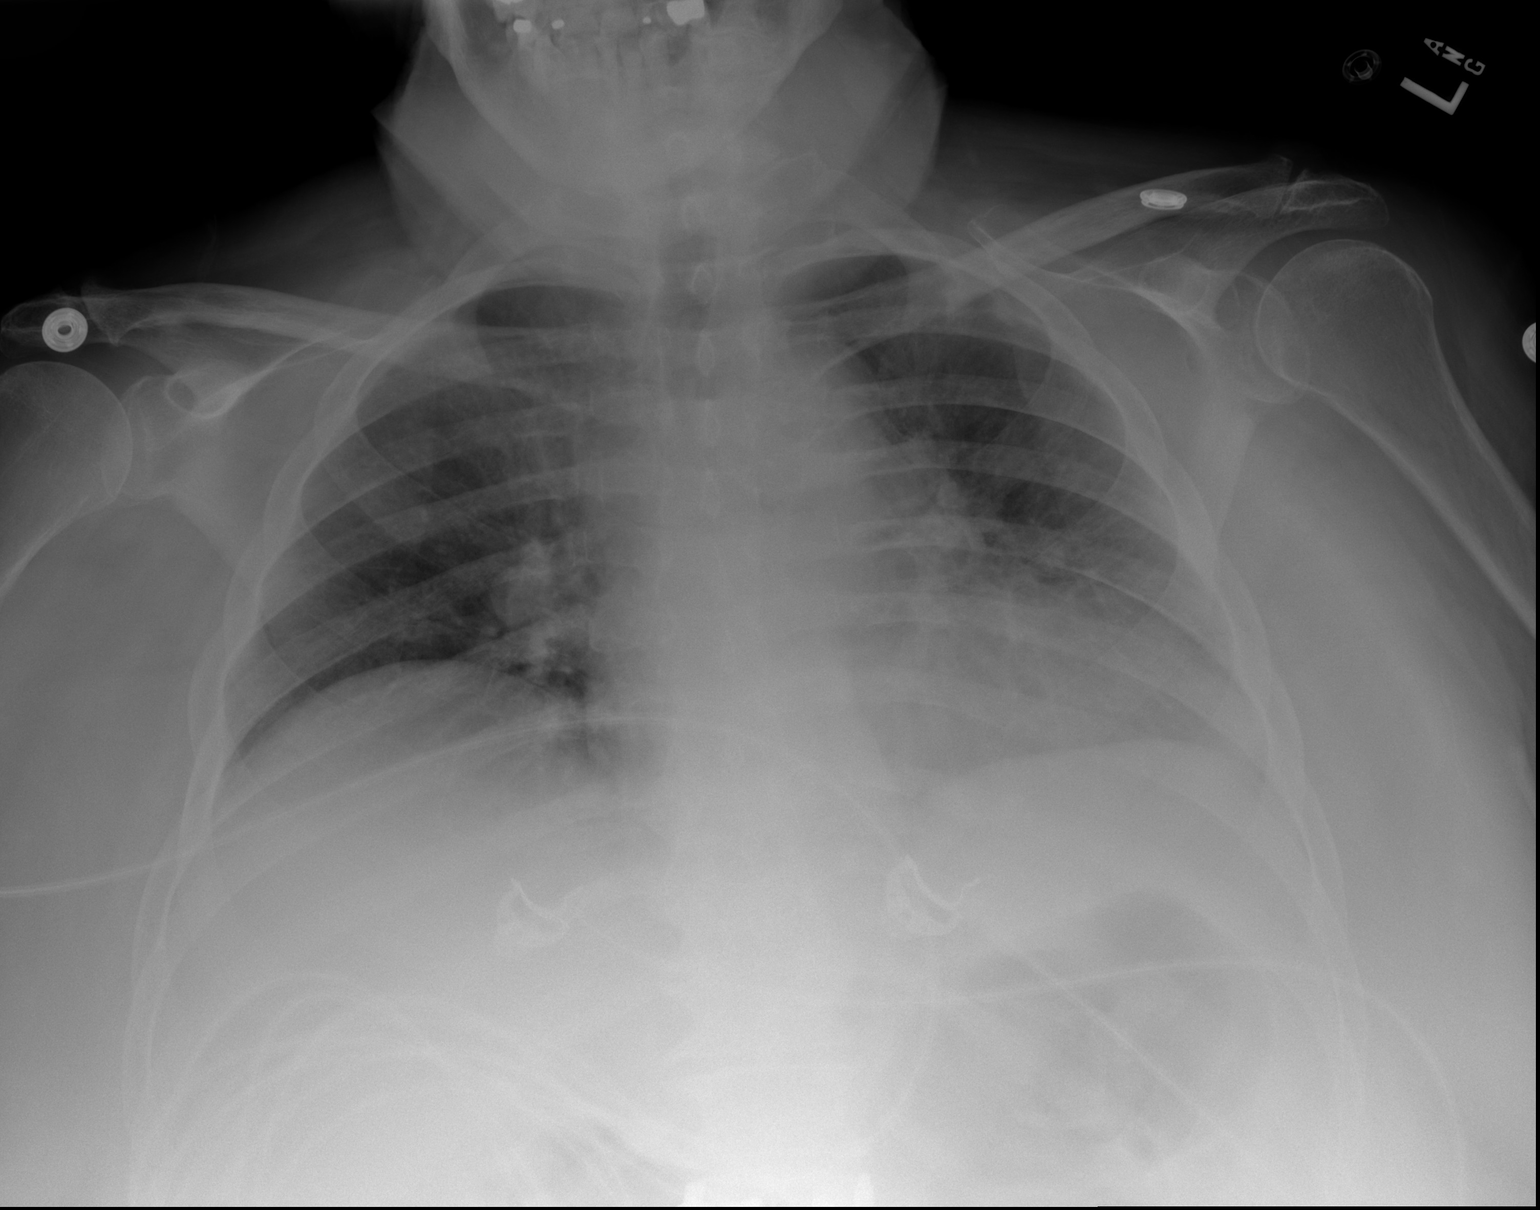

[2 of 2 positions shown; findings below may reference images not displayed]

FINDINGS: Low lung volumes are seen. This is likely secondary to the degree of
patient inspiration. There is no evidence of acute infiltrate,
pleural effusion or pneumothorax. The heart size and mediastinal
contours are within normal limits. Radiopaque surgical clips are
seen within the right upper quadrant. The visualized skeletal
structures are unremarkable.
IMPRESSION: Low lung volumes without evidence of active cardiopulmonary disease.

## 2023-01-01 LAB — BASIC METABOLIC PANEL
BUN: 20 (ref 4–21)
Creatinine: 1.1 (ref 0.5–1.1)
Glucose: 109

## 2023-01-01 LAB — HEPATIC FUNCTION PANEL: Alkaline Phosphatase: 179 — AB (ref 25–125)

## 2023-01-01 LAB — VITAMIN D 25 HYDROXY (VIT D DEFICIENCY, FRACTURES): Vit D, 25-Hydroxy: 25.2

## 2023-01-01 LAB — LIPID PANEL
LDL Cholesterol: 93
Triglycerides: 220 — AB (ref 40–160)

## 2023-01-01 LAB — COMPREHENSIVE METABOLIC PANEL: eGFR: 51.8

## 2023-01-01 LAB — HEMOGLOBIN A1C: Hemoglobin A1C: 8.5

## 2023-01-01 LAB — TSH: TSH: 1.06 (ref 0.41–5.90)

## 2023-01-06 ENCOUNTER — Ambulatory Visit: Payer: Medicare Other | Admitting: Nurse Practitioner

## 2023-01-06 DIAGNOSIS — Z7985 Long-term (current) use of injectable non-insulin antidiabetic drugs: Secondary | ICD-10-CM

## 2023-01-06 DIAGNOSIS — Z794 Long term (current) use of insulin: Secondary | ICD-10-CM

## 2023-01-06 DIAGNOSIS — E559 Vitamin D deficiency, unspecified: Secondary | ICD-10-CM

## 2023-01-27 ENCOUNTER — Ambulatory Visit (INDEPENDENT_AMBULATORY_CARE_PROVIDER_SITE_OTHER): Payer: Medicare Other | Admitting: Nurse Practitioner

## 2023-01-27 ENCOUNTER — Encounter: Payer: Self-pay | Admitting: Nurse Practitioner

## 2023-01-27 VITALS — BP 120/72 | HR 69 | Ht 64.0 in | Wt 232.0 lb

## 2023-01-27 DIAGNOSIS — E1122 Type 2 diabetes mellitus with diabetic chronic kidney disease: Secondary | ICD-10-CM | POA: Diagnosis not present

## 2023-01-27 DIAGNOSIS — N1831 Chronic kidney disease, stage 3a: Secondary | ICD-10-CM

## 2023-01-27 DIAGNOSIS — Z794 Long term (current) use of insulin: Secondary | ICD-10-CM | POA: Diagnosis not present

## 2023-01-27 DIAGNOSIS — Z7985 Long-term (current) use of injectable non-insulin antidiabetic drugs: Secondary | ICD-10-CM | POA: Diagnosis not present

## 2023-01-27 DIAGNOSIS — E559 Vitamin D deficiency, unspecified: Secondary | ICD-10-CM

## 2023-01-27 LAB — POCT GLYCOSYLATED HEMOGLOBIN (HGB A1C): Hemoglobin A1C: 8.9 % — AB (ref 4.0–5.6)

## 2023-01-27 LAB — POCT UA - MICROALBUMIN

## 2023-01-27 MED ORDER — SEMAGLUTIDE (1 MG/DOSE) 4 MG/3ML ~~LOC~~ SOPN: 1.0000 mg | PEN_INJECTOR | SUBCUTANEOUS | 1 refills | Status: DC

## 2023-01-27 MED ORDER — OZEMPIC (0.25 OR 0.5 MG/DOSE) 2 MG/3ML ~~LOC~~ SOPN: 0.5000 mg | PEN_INJECTOR | SUBCUTANEOUS | 0 refills | Status: DC

## 2023-01-27 NOTE — Progress Notes (Signed)
Endocrinology Follow Up Note       01/27/2023, 1:55 PM   Subjective:    Patient ID: Tonya Baker, female    DOB: 1950/10/02.  Tonya Baker is being seen in follow up after being seen in consultation for management of currently uncontrolled symptomatic diabetes requested by  Buckner Malta, PA-C.   Past Medical History:  Diagnosis Date   Back pain    Obstructive sleep apnea    Type 2 diabetes mellitus with diabetic autonomic neuropathy, with long-term current use of insulin (HCC)     Past Surgical History:  Procedure Laterality Date   APPLICATION OF ROBOTIC ASSISTANCE FOR SPINAL PROCEDURE N/A 03/10/2021   Procedure: APPLICATION OF ROBOTIC ASSISTANCE FOR SPINAL PROCEDURE;  Surgeon: [name], MD;  Location: [facility] OR;  Service: Neurosurgery;  Laterality: N/A;   BACK SURGERY N/A 06/2020   BREAST SURGERY      Social History   Socioeconomic History   Marital status: Single    Spouse name: Not on file   Number of children: Not on file   Years of education: Not on file   Highest education level: Not on file  Occupational History   Not on file  Tobacco Use   Smoking status: Former    Current packs/day: 0.00    Types: Cigarettes    Quit date: 54    Years since quitting: 34.8   Smokeless tobacco: Never  Substance and Sexual Activity   Alcohol use: Not Currently    Comment: socially   Drug use: Never   Sexual activity: Not Currently  Other Topics Concern   Not on file  Social History Narrative   Not on file   Social Determinants of Health   Financial Resource Strain: Not on file  Food Insecurity: Low Risk  (09/21/2022)   [facility]   Hunger Vital Sign    Worried About Running Out of Food in the Last Year: Never true    Ran Out of Food in the Last Year: Never true  Transportation Needs: Not on file (09/21/2022)  Physical Activity: Not on file  Stress: Not  on file  Social Connections: Not on file    History reviewed. No pertinent family history.  Outpatient Encounter Medications as of 01/27/2023  Medication Sig   acetaminophen (TYLENOL) 325 MG tablet Take 650 mg by mouth every 6 (six) hours as needed (pain).   albuterol (PROVENTIL) (2.5 MG/3ML) 0.083% nebulizer solution Take 2.5 mg by nebulization every 8 (eight) hours as needed (congestion).   aspirin 81 MG chewable tablet Chew 81 mg by mouth every morning.   atorvastatin (LIPITOR) 40 MG tablet Take 40 mg by mouth at bedtime. 2100   bisacodyl (DULCOLAX) 10 MG suppository Place 10 mg rectally daily as needed (constipation).   cetirizine (ZYRTEC) 10 MG tablet Take 10 mg by mouth at bedtime.   cholecalciferol (VITAMIN D3) 25 MCG (1000 UNIT) tablet Take 1,000 Units by mouth every morning.   cyclobenzaprine (FLEXERIL) 10 MG tablet Take 10 mg by mouth 3 (three) times daily.   docusate sodium (COLACE) 100 MG capsule Take 100 mg by mouth every morning.   doxycycline (VIBRAMYCIN) 100 MG  capsule Take 100 mg by mouth every 12 (twelve) hours. 2100   ferrous sulfate 325 (65 FE) MG tablet Take 1 tablet (325 mg total) by mouth every Monday, Wednesday, and Friday.   furosemide (LASIX) 40 MG tablet Take 1 tablet (40 mg total) by mouth every other day.   gabapentin (NEURONTIN) 300 MG capsule Take 600 mg by mouth 3 (three) times daily.   HYDROcodone-acetaminophen (NORCO) 7.5-325 MG tablet Take 1 tablet by mouth every 6 (six) hours as needed.   insulin aspart (FIASP FLEXTOUCH) 100 UNIT/ML FlexTouch Pen Inject 4-10 Units into the skin 3 (three) times daily before meals. Inject 4-10 Units into the skin 3 (three) times daily with meals. 4 units if glucose 90-150 and eating. 151-200= 5 units; 201-250= 6 units; 251-300=7 units; 301-350= 8 units; 351-400= 9 units; above 400 = 10 units   Insulin Glargine (BASAGLAR KWIKPEN) 100 UNIT/ML Inject 30 Units into the skin at bedtime.   magnesium citrate SOLN Take 30 mLs by  mouth every 12 (twelve) hours as needed (constipation).   metoCLOPramide (REGLAN) 5 MG tablet Take 5 mg by mouth 4 (four) times daily -  before meals and at bedtime.   midodrine (PROAMATINE) 10 MG tablet Take 3 tablets (30 mg total) by mouth 3 (three) times daily. (Patient taking differently: Take 20 mg by mouth 3 (three) times daily.)   oxyCODONE (OXYCONTIN) 10 mg 12 hr tablet Take 10 mg by mouth 2 (two) times daily as needed.   pantoprazole (PROTONIX) 40 MG tablet Take 40 mg by mouth 2 (two) times daily.   polyethylene glycol (MIRALAX / GLYCOLAX) 17 g packet Take 17 g by mouth every morning.   potassium chloride SA (KLOR-CON M) 20 MEQ tablet Take 1 tablet (20 mEq total) by mouth every other day.   QUEtiapine (SEROQUEL) 25 MG tablet Take 25 mg by mouth at bedtime. 2100   saccharomyces boulardii (FLORASTOR) 250 MG capsule Take 250 mg by mouth 2 (two) times daily.   [START ON 02/24/2023] Semaglutide, 1 MG/DOSE, 4 MG/3ML SOPN Inject 1 mg as directed once a week.   Semaglutide,0.25 or 0.5MG /DOS, (OZEMPIC, 0.25 OR 0.5 MG/DOSE,) 2 MG/3ML SOPN Inject 0.5 mg into the skin once a week.   sertraline (ZOLOFT) 100 MG tablet Take 100 mg by mouth every morning.   Skin Protectants, Misc. (EUCERIN) cream Apply 1 application topically daily.   temazepam (RESTORIL) 30 MG capsule Take 30 mg by mouth at bedtime.   Continuous Blood Gluc Sensor (DEXCOM G7 SENSOR) MISC Inject 1 Application into the skin as directed. Change sensor every 10 days as directed. (Patient not taking: Reported on 01/27/2023)   furosemide (LASIX) 40 MG tablet Take 1 tablet (40 mg total) by mouth daily as needed for fluid or edema.   [DISCONTINUED] Semaglutide, 1 MG/DOSE, 4 MG/3ML SOPN Inject 1 mg as directed once a week. (Patient not taking: Reported on 01/27/2023)   No facility-administered encounter medications on file as of 01/27/2023.    ALLERGIES: Allergies  Allergen Reactions   Duloxetine Hcl Other (See Comments)     tremors    Cortisone Other (See Comments)    Unknown reaction   Hydrocortisone Other (See Comments)    Unknown reaction   Latex Other (See Comments)    Unknown reaction    VACCINATION STATUS: Immunization History  Administered Date(s) Administered   Fluad Quad(high Dose 65+) 03/13/2021   Pneumococcal Polysaccharide-23 03/13/2021   Tdap 01/23/2021    Diabetes She presents for her follow-up diabetic visit. She  has type 2 diabetes mellitus. Onset time: diagnosed at approx age of 54. Her disease course has been worsening. There are no hypoglycemic associated symptoms. Associated symptoms include foot paresthesias. There are no hypoglycemic complications. Diabetic complications include heart disease, nephropathy, peripheral neuropathy and PVD. Risk factors for coronary artery disease include diabetes mellitus, dyslipidemia, family history, obesity, sedentary lifestyle, hypertension and post-menopausal. Current diabetic treatment includes intensive insulin program (been out of Ozempic for 2 months due to pharmacy shortage?). She is compliant with treatment most of the time. Her weight is fluctuating minimally. She is following a generally healthy diet. Meal planning includes ADA exchanges. She has not had a previous visit with a dietitian. She rarely participates in exercise. Her home blood glucose trend is fluctuating minimally. Her breakfast blood glucose range is generally >200 mg/dl. Her lunch blood glucose range is generally 140-180 mg/dl. Her dinner blood glucose range is generally 140-180 mg/dl. Her bedtime blood glucose range is generally 180-200 mg/dl. (She presents today with her logs showing above target fasting glycemic profile and near target postprandial readings.  Her POCT A1c today is 8.9%, increasing from last visit of 8.2%.  She notes she has not had her Ozempic in 2 months due to pharmacy shortage.) An ACE inhibitor/angiotensin II receptor blocker is not being taken. She sees a podiatrist.Eye exam  is current.     Review of systems  Constitutional: + Minimally fluctuating body weight,  current Body mass index is 39.82 kg/m. , no fatigue, no subjective hyperthermia, no subjective hypothermia Eyes: no blurry vision, no xerophthalmia ENT: no sore throat, no nodules palpated in throat, no dysphagia/odynophagia, no hoarseness Cardiovascular: no chest pain, no shortness of breath, no palpitations, no leg swelling Respiratory: no cough, no shortness of breath Gastrointestinal: no nausea/vomiting/diarrhea Musculoskeletal: no muscle/joint aches Skin: no rashes, no hyperemia Neurological: no tremors, no numbness, no tingling, no dizziness Psychiatric: no depression, no anxiety  Objective:     BP 120/72 (BP Location: Left Arm, Patient Position: Sitting, Cuff Size: Large)   Pulse 69   Ht 5\' 4"  (1.626 m)   Wt 232 lb (105.2 kg)   BMI 39.82 kg/m   Wt Readings from Last 3 Encounters:  01/27/23 232 lb (105.2 kg)  08/03/22 240 lb (108.9 kg)  04/27/22 235 lb (106.6 kg)     BP Readings from Last 3 Encounters:  01/27/23 120/72  08/03/22 126/78  06/01/22 110/71      Physical Exam- Limited  Constitutional:  Body mass index is 39.82 kg/m. , not in acute distress, normal state of mind Eyes:  EOMI, no exophthalmos Musculoskeletal: no gross deformities, strength intact in all four extremities, no gross restriction of joint movements Skin:  no rashes, no hyperemia Neurological: no tremor with outstretched hands   Diabetic Foot Exam - Simple   Simple Foot Form Diabetic Foot exam was performed with the following findings: Yes 01/27/2023  1:50 PM  Visual Inspection See comments: Yes Sensation Testing Intact to touch and monofilament testing bilaterally: Yes Pulse Check Posterior Tibialis and Dorsalis pulse intact bilaterally: Yes Comments 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  Left second toe, she stubbed it and the nail has some dried blood     CMP ( most recent) CMP     Component Value Date/Time   NA 139 05/04/2021 1729   K 3.6 05/04/2021 1729   CL 102 05/04/2021 1729   CO2 28 05/04/2021 1729   GLUCOSE 200 (H) 05/04/2021 1729   BUN  20 01/01/2023 0000   CREATININE 1.1 01/01/2023 0000   CREATININE 1.23 (H) 05/04/2021 1729   CALCIUM 9.2 05/04/2021 1729   PROT 6.9 04/04/2021 1040   ALBUMIN 2.5 (L) 04/06/2021 0931   AST 21 04/04/2021 1040   ALT 11 04/04/2021 1040   ALKPHOS 179 (A) 01/01/2023 0000   BILITOT 0.7 04/04/2021 1040   GFRNONAA 48 (L) 05/04/2021 1729     Diabetic Labs (most recent): Lab Results  Component Value Date   HGBA1C 8.9 (A) 01/27/2023   HGBA1C 8.5 01/01/2023   HGBA1C 8.2 (A) 08/03/2022   MICROALBUR 30mg /L 01/27/2023     Lipid Panel ( most recent) Lipid Panel     Component Value Date/Time   TRIG 220 (A) 01/01/2023 0000   LDLCALC 93 01/01/2023 0000      Lab Results  Component Value Date   TSH 1.06 01/01/2023   TSH 2.03 10/29/2021   TSH 0.884 04/04/2021           Assessment & Plan:   1) Type 2 diabetes mellitus with stage 3a chronic kidney disease, with long-term current use of insulin (HCC)  She presents today with her logs showing above target fasting glycemic profile and near target postprandial readings.  Her POCT A1c today is 8.9%, increasing from last visit of 8.2%.  She notes she has not had her Ozempic in 2 months due to pharmacy shortage.  - Marvyl Finkbiner has currently uncontrolled symptomatic type 2 DM since 72 years of age.   -Recent labs reviewed.  - I had a long discussion with her about the progressive nature of diabetes and the pathology behind its complications. -her diabetes is complicated by CKD, PAD, CAD, gastroparesis, neuropathy and she remains at a high risk for more acute and chronic complications which include CAD, CVA, CKD, retinopathy, and neuropathy. These are all discussed in detail with her.  The following Lifestyle Medicine recommendations according to  American College of Lifestyle Medicine [facility]) were discussed and offered to patient and she agrees to start the journey:  A. Whole Foods, Plant-based plate comprising of fruits and vegetables, plant-based proteins, whole-grain carbohydrates was discussed in detail with the patient.   A list for source of those nutrients were also provided to the patient.  Patient will use only water or unsweetened tea for hydration. B.  The need to stay away from risky substances including alcohol, smoking; obtaining 7 to 9 hours of restorative sleep, at least 150 minutes of moderate intensity exercise weekly, the importance of healthy social connections,  and stress reduction techniques were discussed. C.  A full color page of  Calorie density of various food groups per pound showing examples of each food groups was provided to the patient.  - Nutritional counseling repeated at each appointment due to patients tendency to fall back in to old habits.  - The patient admits there is a room for improvement in their diet and drink choices. -  Suggestion is made for the patient to avoid simple carbohydrates from their diet including Cakes, Sweet Desserts / Pastries, Ice Cream, Soda (diet and regular), Sweet Tea, Candies, Chips, Cookies, Sweet Pastries, Store Bought Juices, Alcohol in Excess of 1-2 drinks a day, Artificial Sweeteners, Coffee Creamer, and "Sugar-free" Products. This will help patient to have stable blood glucose profile and potentially avoid unintended weight gain.   - I encouraged the patient to switch to unprocessed or minimally processed complex starch and increased protein intake (animal or plant source), fruits, and vegetables.   -  Patient is advised to stick to a routine mealtimes to eat 3 meals a day and avoid unnecessary snacks (to snack only to correct hypoglycemia).  - I have approached her with the following individualized plan to manage her diabetes and patient agrees:   -She is advised to  continue her Basaglar 30 units SQ nightly and Novolog 4-10 units TID with meals if glucose is above 90 and she is eating (Specific instructions on how to titrate insulin dosage based on glucose readings given to patient in writing).  Will restart Ozempic 0.5 mg SQ weekly x 1 month, then increase to 1 mg SQ weekly.  Rx sent to [facility].  -she is encouraged to continue monitoring glucose 4 times daily, before meals and before bed, and to call the clinic if she has readings less than 70 or above 300 for 3 tests in a row.  She was not approved to have CGM device as she lives in ALF.  - she is warned not to take insulin without proper monitoring per orders. - Adjustment parameters are given to her for hypo and hyperglycemia in writing.  -She did not tolerate Metformin in the past.  - Specific targets for  A1c; LDL, HDL, and Triglycerides were discussed with the patient.  2) Blood Pressure /Hypertension:  her blood pressure is controlled to target.   she is advised to continue her current medications including Lasix 40 mg po every other day as needed for fluid.  She is also on Midodrine 30 mg po TID.  3) Lipids/Hyperlipidemia:    Review of her recent lipid panel from 01/01/23 showed controlled LDL at 93 and elevated triglycerides of 182 .  she is advised to continue Lipitor 40 mg daily at bedtime.  Side effects and precautions discussed with her.   4)  Weight/Diet:  her Body mass index is 39.82 kg/m.  -  clearly complicating her diabetes care.   she is a candidate for weight loss. I discussed with her the fact that loss of 5 - 10% of her  current body weight will have the most impact on her diabetes management.  Exercise, and detailed carbohydrates information provided  -  detailed on discharge instructions.  5) Chronic Care/Health Maintenance: -she is not on ACEI/ARB and is on Statin medications and is encouraged to initiate and continue to follow up with Ophthalmology, Dentist, Podiatrist at least  yearly or according to recommendations, and advised to stay away from smoking. I have recommended yearly flu vaccine and pneumonia vaccine at least every 5 years; moderate intensity exercise for up to 150 minutes weekly; and sleep for at least 7 hours a day.  - she is advised to maintain close follow up with Buckner Malta, PA-C for primary care needs, as well as her other providers for optimal and coordinated care.    I spent  42  minutes in the care of the patient today including review of labs from CMP, Lipids, Thyroid Function, Hematology (current and previous including abstractions from other facilities); face-to-face time discussing  her blood glucose readings/logs, discussing hypoglycemia and hyperglycemia episodes and symptoms, medications doses, her options of short and long term treatment based on the latest standards of care / guidelines;  discussion about incorporating lifestyle medicine;  and documenting the encounter. Risk reduction counseling performed per USPSTF guidelines to reduce obesity and cardiovascular risk factors.     Please refer to Patient Instructions for Blood Glucose Monitoring and Insulin/Medications Dosing Guide"  in media tab for additional  information. Please  also refer to " Patient Self Inventory" in the Media  tab for reviewed elements of pertinent patient history.  Tremaine Dama participated in the discussions, expressed understanding, and voiced agreement with the above plans.  All questions were answered to her satisfaction. she is encouraged to contact clinic should she have any questions or concerns prior to her return visit.     Follow up plan: - Return in about 3 months (around 04/29/2023) for Diabetes F/U with A1c in office, No previsit labs, Bring meter and logs.   Ronny Bacon, [facility] [address] [place], [place] [address] Phone: [phone] Fax: [phone]  01/27/2023, 1:55 PM

## 2023-01-28 ENCOUNTER — Other Ambulatory Visit: Payer: Self-pay

## 2023-01-28 MED ORDER — OZEMPIC (0.25 OR 0.5 MG/DOSE) 2 MG/3ML ~~LOC~~ SOPN: 0.5000 mg | PEN_INJECTOR | SUBCUTANEOUS | 0 refills | Status: DC

## 2023-02-17 ENCOUNTER — Other Ambulatory Visit: Payer: Self-pay | Admitting: *Deleted

## 2023-02-17 DIAGNOSIS — E1122 Type 2 diabetes mellitus with diabetic chronic kidney disease: Secondary | ICD-10-CM

## 2023-02-17 DIAGNOSIS — Z7985 Long-term (current) use of injectable non-insulin antidiabetic drugs: Secondary | ICD-10-CM

## 2023-02-17 DIAGNOSIS — Z794 Long term (current) use of insulin: Secondary | ICD-10-CM

## 2023-02-17 MED ORDER — OZEMPIC (0.25 OR 0.5 MG/DOSE) 2 MG/3ML ~~LOC~~ SOPN: 0.5000 mg | PEN_INJECTOR | SUBCUTANEOUS | 0 refills | Status: AC

## 2023-05-05 ENCOUNTER — Ambulatory Visit: Payer: Medicare Other | Admitting: Nurse Practitioner
